# Patient Record
Sex: Female | Born: 1945 | Race: White | Hispanic: No | Smoking: Former smoker
Health system: Southern US, Community
[De-identification: ages and names within clinical notes are randomized; demographics above are authoritative.]

## PROBLEM LIST (undated history)

## (undated) DIAGNOSIS — M1990 Unspecified osteoarthritis, unspecified site: Secondary | ICD-10-CM

## (undated) DIAGNOSIS — I10 Essential (primary) hypertension: Secondary | ICD-10-CM

## (undated) DIAGNOSIS — G4730 Sleep apnea, unspecified: Secondary | ICD-10-CM

## (undated) DIAGNOSIS — Z5189 Encounter for other specified aftercare: Secondary | ICD-10-CM

## (undated) DIAGNOSIS — Z8709 Personal history of other diseases of the respiratory system: Secondary | ICD-10-CM

## (undated) DIAGNOSIS — E162 Hypoglycemia, unspecified: Secondary | ICD-10-CM

## (undated) DIAGNOSIS — O321XX0 Maternal care for breech presentation, not applicable or unspecified: Secondary | ICD-10-CM

## (undated) DIAGNOSIS — J302 Other seasonal allergic rhinitis: Secondary | ICD-10-CM

## (undated) DIAGNOSIS — J189 Pneumonia, unspecified organism: Secondary | ICD-10-CM

## (undated) DIAGNOSIS — Z86718 Personal history of other venous thrombosis and embolism: Secondary | ICD-10-CM

## (undated) DIAGNOSIS — M8580 Other specified disorders of bone density and structure, unspecified site: Secondary | ICD-10-CM

## (undated) DIAGNOSIS — T7840XA Allergy, unspecified, initial encounter: Secondary | ICD-10-CM

## (undated) DIAGNOSIS — K649 Unspecified hemorrhoids: Secondary | ICD-10-CM

## (undated) DIAGNOSIS — E039 Hypothyroidism, unspecified: Secondary | ICD-10-CM

## (undated) DIAGNOSIS — N390 Urinary tract infection, site not specified: Secondary | ICD-10-CM

## (undated) HISTORY — DX: Urinary tract infection, site not specified: N39.0

## (undated) HISTORY — DX: Allergy, unspecified, initial encounter: T78.40XA

## (undated) HISTORY — PX: COLONOSCOPY: SHX174

## (undated) HISTORY — PX: ESOPHAGOGASTRODUODENOSCOPY: SHX1529

## (undated) HISTORY — DX: Essential (primary) hypertension: I10

## (undated) HISTORY — DX: Other seasonal allergic rhinitis: J30.2

## (undated) HISTORY — DX: Unspecified osteoarthritis, unspecified site: M19.90

## (undated) HISTORY — DX: Unspecified hemorrhoids: K64.9

## (undated) HISTORY — PX: CATARACT EXTRACTION: SUR2

## (undated) HISTORY — DX: Encounter for other specified aftercare: Z51.89

## (undated) HISTORY — DX: Personal history of other venous thrombosis and embolism: Z86.718

---
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
## 1965-10-11 DIAGNOSIS — Z86718 Personal history of other venous thrombosis and embolism: Secondary | ICD-10-CM

## 1965-10-11 HISTORY — DX: Personal history of other venous thrombosis and embolism: Z86.718

## 1998-07-28 ENCOUNTER — Other Ambulatory Visit: Admission: RE | Admit: 1998-07-28 | Discharge: 1998-07-28 | Payer: Self-pay | Admitting: Family Medicine

## 1999-08-10 ENCOUNTER — Other Ambulatory Visit: Admission: RE | Admit: 1999-08-10 | Discharge: 1999-08-10 | Payer: Self-pay | Admitting: *Deleted

## 2000-10-07 ENCOUNTER — Other Ambulatory Visit: Admission: RE | Admit: 2000-10-07 | Discharge: 2000-10-07 | Payer: Self-pay | Admitting: *Deleted

## 2000-10-30 ENCOUNTER — Ambulatory Visit: Admission: RE | Admit: 2000-10-30 | Discharge: 2000-10-30 | Payer: Self-pay | Admitting: *Deleted

## 2001-10-11 HISTORY — PX: OTHER SURGICAL HISTORY: SHX169

## 2002-07-27 ENCOUNTER — Encounter: Payer: Self-pay | Admitting: Ophthalmology

## 2002-07-27 ENCOUNTER — Ambulatory Visit (HOSPITAL_COMMUNITY): Admission: RE | Admit: 2002-07-27 | Discharge: 2002-07-28 | Payer: Self-pay | Admitting: Ophthalmology

## 2004-01-30 ENCOUNTER — Other Ambulatory Visit: Admission: RE | Admit: 2004-01-30 | Discharge: 2004-01-30 | Payer: Self-pay | Admitting: Family Medicine

## 2004-08-18 ENCOUNTER — Ambulatory Visit: Payer: Self-pay | Admitting: Internal Medicine

## 2004-08-20 ENCOUNTER — Ambulatory Visit: Payer: Self-pay | Admitting: Internal Medicine

## 2004-11-10 ENCOUNTER — Ambulatory Visit: Payer: Self-pay | Admitting: Family Medicine

## 2005-01-07 ENCOUNTER — Ambulatory Visit: Payer: Self-pay | Admitting: Family Medicine

## 2005-01-26 ENCOUNTER — Ambulatory Visit: Payer: Self-pay | Admitting: Family Medicine

## 2005-02-17 ENCOUNTER — Ambulatory Visit: Payer: Self-pay | Admitting: Family Medicine

## 2005-02-24 ENCOUNTER — Other Ambulatory Visit: Admission: RE | Admit: 2005-02-24 | Discharge: 2005-02-24 | Payer: Self-pay | Admitting: Internal Medicine

## 2005-02-24 ENCOUNTER — Ambulatory Visit: Payer: Self-pay | Admitting: Family Medicine

## 2005-03-18 ENCOUNTER — Ambulatory Visit: Payer: Self-pay | Admitting: Internal Medicine

## 2005-06-30 ENCOUNTER — Ambulatory Visit: Payer: Self-pay | Admitting: Family Medicine

## 2005-11-25 ENCOUNTER — Ambulatory Visit: Payer: Self-pay | Admitting: Family Medicine

## 2006-02-17 ENCOUNTER — Ambulatory Visit: Payer: Self-pay | Admitting: Family Medicine

## 2006-03-03 ENCOUNTER — Ambulatory Visit: Payer: Self-pay | Admitting: Family Medicine

## 2006-10-08 ENCOUNTER — Ambulatory Visit: Payer: Self-pay | Admitting: Internal Medicine

## 2006-12-27 ENCOUNTER — Ambulatory Visit: Payer: Self-pay | Admitting: Family Medicine

## 2007-03-01 ENCOUNTER — Ambulatory Visit: Payer: Self-pay | Admitting: Family Medicine

## 2007-03-01 LAB — CONVERTED CEMR LAB
ALT: 14 units/L (ref 0–40)
AST: 19 units/L (ref 0–37)
Albumin: 3.8 g/dL (ref 3.5–5.2)
Alkaline Phosphatase: 65 units/L (ref 39–117)
BUN: 12 mg/dL (ref 6–23)
Basophils Absolute: 0 10*3/uL (ref 0.0–0.1)
Basophils Relative: 0.1 % (ref 0.0–1.0)
Bilirubin, Direct: 0.1 mg/dL (ref 0.0–0.3)
CO2: 32 meq/L (ref 19–32)
Calcium: 9.5 mg/dL (ref 8.4–10.5)
Chloride: 110 meq/L (ref 96–112)
Cholesterol: 168 mg/dL (ref 0–200)
Creatinine, Ser: 0.6 mg/dL (ref 0.4–1.2)
Eosinophils Absolute: 0.1 10*3/uL (ref 0.0–0.6)
Eosinophils Relative: 2.2 % (ref 0.0–5.0)
GFR calc Af Amer: 131 mL/min
GFR calc non Af Amer: 108 mL/min
Glucose, Bld: 87 mg/dL (ref 70–99)
HCT: 39.2 % (ref 36.0–46.0)
HDL: 55.9 mg/dL (ref >39.0)
Hemoglobin: 13.3 g/dL (ref 12.0–15.0)
LDL Cholesterol: 82 mg/dL (ref 0–99)
Lymphocytes Relative: 30 % (ref 12.0–46.0)
MCHC: 34 g/dL (ref 30.0–36.0)
MCV: 89.3 fL (ref 78.0–100.0)
Monocytes Absolute: 0.4 10*3/uL (ref 0.2–0.7)
Monocytes Relative: 9.8 % (ref 3.0–11.0)
Neutro Abs: 2.6 10*3/uL (ref 1.4–7.7)
Neutrophils Relative %: 57.9 % (ref 43.0–77.0)
Platelets: 256 10*3/uL (ref 150–400)
Potassium: 4.2 meq/L (ref 3.5–5.1)
RBC: 4.39 M/uL (ref 3.87–5.11)
RDW: 13 % (ref 11.5–14.6)
Sodium: 144 meq/L (ref 135–145)
TSH: 0.05 microintl units/mL — ABNORMAL LOW (ref 0.35–5.50)
Total Bilirubin: 0.7 mg/dL (ref 0.3–1.2)
Total CHOL/HDL Ratio: 3
Total Protein: 6.9 g/dL (ref 6.0–8.3)
Triglycerides: 153 mg/dL — ABNORMAL HIGH (ref 0–149)
VLDL: 31 mg/dL (ref 0–40)
WBC: 4.5 10*3/uL (ref 4.5–10.5)

## 2007-03-05 ENCOUNTER — Emergency Department (HOSPITAL_COMMUNITY): Admission: EM | Admit: 2007-03-05 | Discharge: 2007-03-05 | Payer: Self-pay | Admitting: Emergency Medicine

## 2007-03-08 ENCOUNTER — Encounter: Payer: Self-pay | Admitting: Family Medicine

## 2007-03-08 ENCOUNTER — Ambulatory Visit: Payer: Self-pay | Admitting: Family Medicine

## 2007-03-08 ENCOUNTER — Other Ambulatory Visit: Admission: RE | Admit: 2007-03-08 | Discharge: 2007-03-08 | Payer: Self-pay | Admitting: *Deleted

## 2007-03-08 LAB — CONVERTED CEMR LAB: Pap Smear: NORMAL

## 2007-03-13 LAB — CONVERTED CEMR LAB: Pap Smear: NEGATIVE

## 2007-08-26 ENCOUNTER — Ambulatory Visit: Payer: Self-pay | Admitting: Family Medicine

## 2007-08-26 DIAGNOSIS — J309 Allergic rhinitis, unspecified: Secondary | ICD-10-CM | POA: Insufficient documentation

## 2007-08-31 ENCOUNTER — Ambulatory Visit: Payer: Self-pay | Admitting: Family Medicine

## 2007-09-27 ENCOUNTER — Ambulatory Visit: Payer: Self-pay | Admitting: Family Medicine

## 2007-10-24 ENCOUNTER — Ambulatory Visit: Payer: Self-pay | Admitting: Family Medicine

## 2007-11-07 ENCOUNTER — Telehealth: Payer: Self-pay | Admitting: Family Medicine

## 2008-03-14 ENCOUNTER — Ambulatory Visit: Payer: Self-pay | Admitting: Family Medicine

## 2008-03-14 LAB — CONVERTED CEMR LAB
Bilirubin Urine: NEGATIVE
Blood in Urine, dipstick: NEGATIVE
Glucose, Urine, Semiquant: NEGATIVE
Ketones, urine, test strip: NEGATIVE
Nitrite: NEGATIVE
Protein, U semiquant: NEGATIVE
Specific Gravity, Urine: <1.005
Urobilinogen, UA: 0.2
WBC Urine, dipstick: NEGATIVE
pH: 5

## 2008-03-27 ENCOUNTER — Ambulatory Visit: Payer: Self-pay | Admitting: Family Medicine

## 2008-03-27 DIAGNOSIS — E039 Hypothyroidism, unspecified: Secondary | ICD-10-CM | POA: Insufficient documentation

## 2008-03-27 DIAGNOSIS — M129 Arthropathy, unspecified: Secondary | ICD-10-CM | POA: Insufficient documentation

## 2008-03-27 LAB — CONVERTED CEMR LAB
ALT: 14 units/L (ref 0–35)
AST: 21 units/L (ref 0–37)
Albumin: 3.8 g/dL (ref 3.5–5.2)
Alkaline Phosphatase: 62 units/L (ref 39–117)
BUN: 11 mg/dL (ref 6–23)
Basophils Absolute: 0 10*3/uL (ref 0.0–0.1)
Basophils Relative: 0.7 % (ref 0.0–1.0)
Bilirubin, Direct: 0.1 mg/dL (ref 0.0–0.3)
CO2: 30 meq/L (ref 19–32)
Calcium: 9.3 mg/dL (ref 8.4–10.5)
Chloride: 105 meq/L (ref 96–112)
Cholesterol: 144 mg/dL (ref 0–200)
Creatinine, Ser: 0.6 mg/dL (ref 0.4–1.2)
Eosinophils Absolute: 0.2 10*3/uL (ref 0.0–0.7)
Eosinophils Relative: 3.2 % (ref 0.0–5.0)
GFR calc Af Amer: 130 mL/min
GFR calc non Af Amer: 107 mL/min
Glucose, Bld: 93 mg/dL (ref 70–99)
HCT: 38.6 % (ref 36.0–46.0)
HDL: 61.8 mg/dL (ref >39.0)
Hemoglobin: 13.5 g/dL (ref 12.0–15.0)
LDL Cholesterol: 64 mg/dL (ref 0–99)
Lymphocytes Relative: 32.1 % (ref 12.0–46.0)
MCHC: 34.9 g/dL (ref 30.0–36.0)
MCV: 90.4 fL (ref 78.0–100.0)
Monocytes Absolute: 0.5 10*3/uL (ref 0.1–1.0)
Monocytes Relative: 10.2 % (ref 3.0–12.0)
Neutro Abs: 2.6 10*3/uL (ref 1.4–7.7)
Neutrophils Relative %: 53.8 % (ref 43.0–77.0)
Platelets: 258 10*3/uL (ref 150–400)
Potassium: 3.8 meq/L (ref 3.5–5.1)
RBC: 4.27 M/uL (ref 3.87–5.11)
RDW: 12.8 % (ref 11.5–14.6)
Sodium: 140 meq/L (ref 135–145)
TSH: 0.12 microintl units/mL — ABNORMAL LOW (ref 0.35–5.50)
Total Bilirubin: 0.7 mg/dL (ref 0.3–1.2)
Total CHOL/HDL Ratio: 2.3
Total Protein: 6.9 g/dL (ref 6.0–8.3)
Triglycerides: 90 mg/dL (ref 0–149)
VLDL: 18 mg/dL (ref 0–40)
WBC: 4.8 10*3/uL (ref 4.5–10.5)

## 2008-05-02 ENCOUNTER — Ambulatory Visit: Payer: Self-pay | Admitting: Gastroenterology

## 2008-05-13 ENCOUNTER — Ambulatory Visit: Payer: Self-pay | Admitting: Gastroenterology

## 2008-05-13 HISTORY — PX: COLONOSCOPY: SHX174

## 2008-07-18 ENCOUNTER — Ambulatory Visit: Payer: Self-pay | Admitting: Family Medicine

## 2008-08-28 ENCOUNTER — Ambulatory Visit: Payer: Self-pay | Admitting: Family Medicine

## 2008-08-29 ENCOUNTER — Telehealth: Payer: Self-pay | Admitting: Family Medicine

## 2008-09-12 ENCOUNTER — Encounter: Payer: Self-pay | Admitting: Family Medicine

## 2008-09-25 ENCOUNTER — Ambulatory Visit: Payer: Self-pay | Admitting: Family Medicine

## 2008-09-25 DIAGNOSIS — H8309 Labyrinthitis, unspecified ear: Secondary | ICD-10-CM | POA: Insufficient documentation

## 2008-09-27 ENCOUNTER — Telehealth: Payer: Self-pay | Admitting: Family Medicine

## 2008-12-05 ENCOUNTER — Ambulatory Visit: Payer: Self-pay | Admitting: Family Medicine

## 2008-12-05 DIAGNOSIS — I10 Essential (primary) hypertension: Secondary | ICD-10-CM | POA: Insufficient documentation

## 2009-01-21 ENCOUNTER — Ambulatory Visit: Payer: Self-pay | Admitting: Family Medicine

## 2009-02-06 ENCOUNTER — Telehealth: Payer: Self-pay | Admitting: Family Medicine

## 2009-02-13 ENCOUNTER — Telehealth: Payer: Self-pay | Admitting: Family Medicine

## 2009-02-25 ENCOUNTER — Telehealth (INDEPENDENT_AMBULATORY_CARE_PROVIDER_SITE_OTHER): Payer: Self-pay | Admitting: *Deleted

## 2009-04-29 ENCOUNTER — Ambulatory Visit: Payer: Self-pay | Admitting: Family Medicine

## 2009-05-07 ENCOUNTER — Ambulatory Visit: Payer: Self-pay | Admitting: Family Medicine

## 2009-05-07 DIAGNOSIS — F410 Panic disorder [episodic paroxysmal anxiety] without agoraphobia: Secondary | ICD-10-CM | POA: Insufficient documentation

## 2009-05-11 LAB — CONVERTED CEMR LAB
Albumin: 3.7 g/dL (ref 3.5–5.2)
Alkaline Phosphatase: 54 units/L (ref 39–117)
Basophils Absolute: 0.1 10*3/uL (ref 0.0–0.1)
Chloride: 108 meq/L (ref 96–112)
Cholesterol: 184 mg/dL (ref 0–200)
Eosinophils Absolute: 0.1 10*3/uL (ref 0.0–0.7)
HDL: 62 mg/dL (ref >39.00)
Hemoglobin: 13.4 g/dL (ref 12.0–15.0)
LDL Cholesterol: 91 mg/dL (ref 0–99)
Leukocytes, UA: NEGATIVE
Lymphocytes Relative: 30.9 % (ref 12.0–46.0)
MCHC: 34.2 g/dL (ref 30.0–36.0)
Neutro Abs: 2.7 10*3/uL (ref 1.4–7.7)
Neutrophils Relative %: 55.5 % (ref 43.0–77.0)
Nitrite: NEGATIVE
Potassium: 4.4 meq/L (ref 3.5–5.1)
RDW: 12.5 % (ref 11.5–14.6)
Sodium: 142 meq/L (ref 135–145)
Specific Gravity, Urine: <=1.005 (ref 1.000–1.030)
TSH: 0.76 microintl units/mL (ref 0.35–5.50)
Triglycerides: 153 mg/dL — ABNORMAL HIGH (ref 0.0–149.0)
Urine Glucose: NEGATIVE mg/dL
Urobilinogen, UA: 0.2 (ref 0.0–1.0)
VLDL: 30.6 mg/dL (ref 0.0–40.0)

## 2009-07-17 ENCOUNTER — Ambulatory Visit: Payer: Self-pay | Admitting: Family Medicine

## 2009-10-29 ENCOUNTER — Telehealth: Payer: Self-pay | Admitting: Family Medicine

## 2009-12-04 ENCOUNTER — Telehealth: Payer: Self-pay | Admitting: Family Medicine

## 2009-12-30 ENCOUNTER — Ambulatory Visit: Payer: Self-pay | Admitting: Family Medicine

## 2009-12-30 DIAGNOSIS — R062 Wheezing: Secondary | ICD-10-CM | POA: Insufficient documentation

## 2009-12-31 ENCOUNTER — Telehealth: Payer: Self-pay | Admitting: Family Medicine

## 2010-01-01 ENCOUNTER — Telehealth: Payer: Self-pay | Admitting: Family Medicine

## 2010-03-04 ENCOUNTER — Encounter: Payer: Self-pay | Admitting: Family Medicine

## 2010-03-10 ENCOUNTER — Encounter: Payer: Self-pay | Admitting: Family Medicine

## 2010-05-05 ENCOUNTER — Ambulatory Visit: Payer: Self-pay | Admitting: Family Medicine

## 2010-05-05 LAB — CONVERTED CEMR LAB
Bilirubin Urine: NEGATIVE
Blood in Urine, dipstick: NEGATIVE
Glucose, Urine, Semiquant: NEGATIVE
Ketones, urine, test strip: NEGATIVE
Nitrite: NEGATIVE
Protein, U semiquant: NEGATIVE
Specific Gravity, Urine: <1.005
Urobilinogen, UA: 0.2
WBC Urine, dipstick: NEGATIVE
pH: 6

## 2010-05-14 ENCOUNTER — Ambulatory Visit: Payer: Self-pay | Admitting: Family Medicine

## 2010-05-14 ENCOUNTER — Other Ambulatory Visit: Admission: RE | Admit: 2010-05-14 | Discharge: 2010-05-14 | Payer: Self-pay | Admitting: Family Medicine

## 2010-05-14 DIAGNOSIS — R030 Elevated blood-pressure reading, without diagnosis of hypertension: Secondary | ICD-10-CM | POA: Insufficient documentation

## 2010-05-20 ENCOUNTER — Encounter: Payer: Self-pay | Admitting: Family Medicine

## 2010-05-20 LAB — CONVERTED CEMR LAB
AST: 19 units/L (ref 0–37)
Albumin: 3.9 g/dL (ref 3.5–5.2)
Basophils Absolute: 0 10*3/uL (ref 0.0–0.1)
Basophils Relative: 0.7 % (ref 0.0–3.0)
CO2: 30 meq/L (ref 19–32)
Eosinophils Absolute: 0.1 10*3/uL (ref 0.0–0.7)
Glucose, Bld: 93 mg/dL (ref 70–99)
HCT: 35.8 % — ABNORMAL LOW (ref 36.0–46.0)
Hemoglobin: 12.3 g/dL (ref 12.0–15.0)
Lymphs Abs: 1.1 10*3/uL (ref 0.7–4.0)
MCHC: 34.4 g/dL (ref 30.0–36.0)
Monocytes Relative: 9 % (ref 3.0–12.0)
Neutro Abs: 2.2 10*3/uL (ref 1.4–7.7)
Potassium: 4.6 meq/L (ref 3.5–5.1)
RBC: 3.86 M/uL — ABNORMAL LOW (ref 3.87–5.11)
RDW: 12.9 % (ref 11.5–14.6)
Sodium: 142 meq/L (ref 135–145)
TSH: 0.12 microintl units/mL — ABNORMAL LOW (ref 0.35–5.50)
Total Protein: 6.4 g/dL (ref 6.0–8.3)

## 2010-07-09 ENCOUNTER — Telehealth: Payer: Self-pay | Admitting: Family Medicine

## 2010-07-09 ENCOUNTER — Ambulatory Visit: Payer: Self-pay | Admitting: Family Medicine

## 2010-08-12 ENCOUNTER — Ambulatory Visit: Payer: Self-pay | Admitting: Family Medicine

## 2010-08-12 DIAGNOSIS — J45909 Unspecified asthma, uncomplicated: Secondary | ICD-10-CM | POA: Insufficient documentation

## 2010-08-12 DIAGNOSIS — J019 Acute sinusitis, unspecified: Secondary | ICD-10-CM | POA: Insufficient documentation

## 2010-08-26 ENCOUNTER — Ambulatory Visit: Payer: Self-pay | Admitting: Family Medicine

## 2010-11-10 NOTE — Assessment & Plan Note (Signed)
Summary: ?SINUS INF/NJR   Vital Signs:  Patient profile:   65 year old female Menstrual status:  postmenopausal Height:      65.5 inches (166.37 cm) Weight:      185 pounds (84.09 kg) Temp:     98.6 degrees F (37.00 degrees C) oral Pulse rate:   88 / minute BP sitting:   116 / 80  (left arm) Cuff size:   large  Vitals Entered By: Josph Macho RMA (August 12, 2010 10:36 AM) CC: possible sinus infection X3 days- head congested (yesterday), blows nose (yellow), now in chest cough w/ phlegm (sometimes clear, sometimes yellow)/ CF Is Patient Diabetic? No   History of Present Illness: is a 64 year old Caucasian female in today with a 3 day history of worsening congestion. She is a nonsmoker but has a long history of allergic rhinitis recurrent sinusitis and asthma which has worsened over the last year. She reports typically each spring each fall she will get a flare of her allergies leading to. She started 3 days ago with head congestion productive of yellow sputum malaise, myalgias and then over the last 24 hours for congestion feels like is moving into her chest she is now coughing up some yellowish sputum. The cough has become severe enough to catch her breath at times but she denies that her shortness of breath or wheezing thus far. No high-grade fevers chills, chest pain, palpitations, GI or GU complaints at this time.  Current Medications (verified): 1)  Adult Aspirin Ec Low Strength 81 Mg  Tbec (Aspirin) .... Once Daily 2)  Fish Oil .... At Bedtime 3)  Tylenol Arthritis 4)  Mucinex D [phone] Mg Xr12h-Tab (Pseudoephedrine-Guaifenesin) .Marland Kitchen.. 1 Two Times A Day 5)  Vitamin C Cr 500 Mg Cr-Caps (Ascorbic Acid) .... Once Daily 6)  Omnaris 50 Mcg/act Susp (Ciclesonide) .... 2 Spray7s Each Nostril Each Day 7)  Lisinopril 20 Mg Tabs (Lisinopril) .Marland Kitchen.. 1 Once Daily For Blood Pressure 8)  Lotrisone 1-0.05 % Crea (Clotrimazole-Betamethasone) .... Apply Two Times A Day For 10 Days , Repeat As  Needed 9)  Gnp Hydrocortisone 1 % Crea (Hydrocortisone) .... Apply Three Times A Day  For 3 Days Then Apply Two Times A Day Until 2 Days After Rash Is Gone 10)  Claritin 10 Mg Tabs (Loratadine) .... Once Daily 11)  Vitamin B-12 500 Mcg Tabs (Cyanocobalamin) .Marland Kitchen.. 1 Once Daily 12)  Sm Folic Acid 400 Mcg Tabs (Folic Acid) .Marland Kitchen.. 1 By Mouth Two Times A Day 13)  Tylenol Extra Strength 500 Mg Tabs (Acetaminophen) .... As Needed 14)  Alprazolam 0.25 Mg Tabs (Alprazolam) .Marland Kitchen.. 1 By  Morning Midafternoon and Bedtime To Prevent Stress and Panic Attacks 15)  Symbicort 160-4.5 Mcg/act Aero (Budesonide-Formoterol Fumarate) .... 2 Inhalations Bid 16)  Levothroid 112 Mcg Tabs (Levothyroxine Sodium) .Marland Kitchen.. 1 Qd  Allergies (verified): 1)  ! Codeine 2)  ! * Lodeine  Past History:  Past medical history reviewed for relevance to current acute and chronic problems. Social history (including risk factors) reviewed for relevance to current acute and chronic problems.  Social History: Reviewed history from 03/27/2008 and no changes required. Former Smoker  Review of Systems      See HPI  Physical Exam  General:  Well-developed,well-nourished,in no acute distress; alert,appropriate and cooperative throughout examination Head:  Normocephalic and atraumatic without obvious abnormalities. No apparent alopecia or balding. Ears:  External ear exam shows no significant lesions or deformities.  Otoscopic examination reveals clear canals, tympanic membranes are intact bilaterally  without bulging, retraction, inflammation or discharge. Hearing is grossly normal bilaterally. Nose:  mucosal erythema and mucosal edema.   Mouth:  Oral mucosa and oropharynx without lesions or exudates.   Mild erythema in posterior oropharynx Neck:  No deformities, masses, or tenderness noted. Lungs:  Normal respiratory effort, chest expands symmetrically. Lungs are clear to auscultation, no crackles or wheezes. Heart:  Normal rate and  regular rhythm. S1 and S2 normal without gallop, murmur, click, rub or other extra sounds. Abdomen:  Bowel sounds positive,abdomen soft and non-tender without masses, organomegaly or hernias noted. Obese Extremities:  No clubbing, cyanosis, edema, or deformity noted with normal full range of motion of all joints.   Cervical Nodes:  No lymphadenopathy noted Psych:  Cognition and judgment appear intact. Alert and cooperative with normal attention span and concentration. No apparent delusions, illusions, hallucinations   Impression & Recommendations:  Problem # 1:  Acute Sinusitis increase fluids Take Mucinex to help keep secretions loose Take Augmentin 875 mg twice a day until all the medicaqiton is gone. Take a probiotic (Align) while taking the antibiotic and for 2 weeks after.  Consider eating a yogurt every day. Taken out of work 11/2 to 11/3  Problem # 2:  Asthma use a rescue inhaler for c hest tightness or wheezing.  Given an rx for Proventil as needed and given samples of her Symbicort, report worsening symptoms  Problem # 3:  HYPERTENSION, BENIGN (ICD-401.1)  Her updated medication list for this problem includes:    Lisinopril 20 Mg Tabs (Lisinopril) .Marland Kitchen... 1 once daily for blood pressure Well controlled despite current illness no changes to meds  Complete Medication List: 1)  Adult Aspirin Ec Low Strength 81 Mg Tbec (Aspirin) .... Once daily 2)  Fish Oil  .... At bedtime 3)  Tylenol Arthritis  4)  Mucinex D [phone] Mg Xr12h-tab (Pseudoephedrine-guaifenesin) .Marland Kitchen.. 1 two times a day 5)  Vitamin C Cr 500 Mg Cr-caps (Ascorbic acid) .... Once daily 6)  Omnaris 50 Mcg/act Susp (Ciclesonide) .... 2 spray7s each nostril each day 7)  Lisinopril 20 Mg Tabs (Lisinopril) .Marland Kitchen.. 1 once daily for blood pressure 8)  Lotrisone 1-0.05 % Crea (Clotrimazole-betamethasone) .... Apply two times a day for 10 days , repeat as needed 9)  Gnp Hydrocortisone 1 % Crea (Hydrocortisone) .... Apply three  times a day  for 3 days then apply two times a day until 2 days after rash is gone 10)  Claritin 10 Mg Tabs (Loratadine) .... Once daily 11)  Vitamin B-12 500 Mcg Tabs (Cyanocobalamin) .Marland Kitchen.. 1 once daily 12)  Sm Folic Acid 400 Mcg Tabs (Folic acid) .Marland Kitchen.. 1 by mouth two times a day 13)  Tylenol Extra Strength 500 Mg Tabs (Acetaminophen) .... As needed 14)  Alprazolam 0.25 Mg Tabs (Alprazolam) .Marland Kitchen.. 1 by  morning midafternoon and bedtime to prevent stress and panic attacks 15)  Symbicort 160-4.5 Mcg/act Aero (Budesonide-formoterol fumarate) .... 2 inhalations bid 16)  Levothroid 112 Mcg Tabs (Levothyroxine sodium) .Marland Kitchen.. 1 qd 17)  Augmentin 875-125 Mg Tabs (Amoxicillin-pot clavulanate) .Marland Kitchen.. 1 tab by mouth two times a day # 10 days 18)  Mucinex 600 Mg Xr12h-tab (Guaifenesin) .Marland Kitchen.. 1 tab by mouth two times a day x 10 days 19)  Proventil Hfa 108 (90 Base) Mcg/act Aers (Albuterol sulfate) .Marland Kitchen.. 1-2 puffs by mouth q 4-6 hr as needed sob/cough/wheeze  Patient Instructions: 1)  Please schedule a follow-up appointment as needed if symptoms worsenor do not improve 2)  Take your antibiotic as prescribed  until ALL of it is gone, but stop if you develop a rash or swelling and contact our office as soon as possible.  3)  Acute sinusitis symptoms for less than 10 days are not helped by antibiotics. Use warm moist compresses, and over the counter decongestants( only as directed). Call if no improvement in 5-7 days, sooner if increasing pain, fever, or new symptoms.  4)  Consider a probiotic such as Align or a yogurt daily while on antibiotics Prescriptions: PROVENTIL HFA 108 (90 BASE) MCG/ACT AERS (ALBUTEROL SULFATE) 1-2 puffs by mouth q 4-6 hr as needed sob/cough/wheeze  #60 dose hfa x 1   Entered and Authorized by:   Danise Edge MD   Signed by:   Danise Edge MD on 08/12/2010   Method used:   Print then Give to Patient   RxID:   [id number] AUGMENTIN 875-125 MG TABS (AMOXICILLIN-POT CLAVULANATE) 1 tab by  mouth two times a day # 10 days  #20 x 0   Entered and Authorized by:   Danise Edge MD   Signed by:   Danise Edge MD on 08/12/2010   Method used:   Electronically to        [facility] [address] #[id number]* (retail)       [address] [place], [place]  [address]       Ph: [phone]       Fax: [phone]   RxID:   [id number]    Orders Added: 1)  Est. Patient Level IV [47425]

## 2010-11-10 NOTE — Progress Notes (Signed)
Summary: rx alprazolam w 5 refills   Phone Note From Pharmacy   Caller: [facility] [address] #[id number]* Reason for Call: Needs renewal Summary of Call: refill alprazolam Initial call taken by: Pura Spice, RN,  December 04, 2009 3:27 PM  Follow-up for Phone Call        ok x 5 refills per dr Scotty Court  Follow-up by: Pura Spice, RN,  December 04, 2009 3:27 PM    New/Updated Medications: ALPRAZOLAM 0.25 MG TABS (ALPRAZOLAM) 1 MORN , MIDAFTERNOON AND HS TO PREVENT STRESS AND PANIC ATTACKS Prescriptions: ALPRAZOLAM 0.25 MG TABS (ALPRAZOLAM) 1 MORN , MIDAFTERNOON AND HS TO PREVENT STRESS AND PANIC ATTACKS  #90 x 5   Entered by:   Pura Spice, RN   Authorized by:   Judithann Sheen MD   Signed by:   Pura Spice, RN on 12/04/2009   Method used:   Telephoned to ...       [facility] #[id number]* (retail)       [address] [place], [place]  [address]       Ph: [phone]       Fax: [phone]   RxID:   [id number]

## 2010-11-10 NOTE — Assessment & Plan Note (Signed)
Summary: CPX//SLM   Vital Signs:  Patient profile:   65 year old female Menstrual status:  postmenopausal Height:      65.5 inches Weight:      184 pounds BMI:     30.26 Temp:     98.2 degrees F Pulse rate:   56 / minute Pulse rhythm:   regular BP sitting:   136 / 80  (left arm)  Vitals Entered By: Pura Spice, RN (May 14, 2010 9:05 AM) CC: refill go over problmes discuss labs needs PAP  Is Patient Diabetic? No     Menstrual Status postmenopausal Last PAP Result Interpretation/Result:Negative for intraepithelial Lesion or Malignancy.      History of Present Illness: This 65 year old white separated female is in for complete physical examination Complaints of chest congestion tight chest with mild wheezing no problem with call Under considerable stress and has anxiety and needs medication All toes he thinks he feels much better physically over the past year immunizations are up to date Colonoscopic exam 2000 and repeat in 10 years time around 2011  Preventive Screening-Counseling & Management  Alcohol-Tobacco     Smoking Status: quit  Allergies: 1)  ! Codeine 2)  ! Weston Brass  Past History:  Social History: Last updated: 03/27/2008 Former Smoker  Risk Factors: Smoking Status: quit (05/14/2010)  Past Surgical History: Detached retina rt,2003 Cataract rt eye extract 2004  Review of Systems      See HPI  The patient denies anorexia, fever, weight loss, weight gain, vision loss, decreased hearing, hoarseness, chest pain, syncope, dyspnea on exertion, peripheral edema, prolonged cough, headaches, hemoptysis, abdominal pain, melena, hematochezia, severe indigestion/heartburn, hematuria, incontinence, genital sores, muscle weakness, suspicious skin lesions, transient blindness, difficulty walking, depression, unusual weight change, abnormal bleeding, enlarged lymph nodes, angioedema, breast masses, and testicular masses.    Physical Exam  General:   Well-developed,well-nourished,in no acute distress; alert,appropriate and cooperative throughout examinationoverweight-appearing.   Head:  Normocephalic and atraumatic without obvious abnormalities. No apparent alopecia or balding. Eyes:  No corneal or conjunctival inflammation noted. EOMI. Perrla. Funduscopic exam benign, without hemorrhages, exudates or papilledema. Vision grossly normal. Ears:  External ear exam shows no significant lesions or deformities.  Otoscopic examination reveals clear canals, tympanic membranes are intact bilaterally without bulging, retraction, inflammation or discharge. Hearing is grossly normal bilaterally. Nose:  boggy pale nasal mucosa with clear drainage Mouth:  Oral mucosa and oropharynx without lesions or exudates.  Teeth in good repair. Neck:  No deformities, masses, or tenderness noted. Chest Wall:  No deformities, masses, or tenderness noted. Breasts:  No mass, nodules, thickening, tenderness, bulging, retraction, inflamation, nipple discharge or skin changes noted.   Lungs:  rhonchi bilaterally with minimal expiratory wheezing No dullness to percussion Heart:  Normal rate and regular rhythm. S1 and S2 normal without gallop, murmur, click, rub or other extra sounds. electrocardiogram refills sinus bradycardia incomplete right bundle branch block no acute ischemic Abdomen:  Bowel sounds positive,abdomen soft and non-tender without masses, organomegaly or hernias noted. Rectal:  No external abnormalities noted. Normal sphincter tone. No rectal masses or tenderness. Genitalia:  Normal introitus for age, no external lesions, no vaginal discharge, mucosa pink and moist, no vaginal or cervical lesions, no vaginal atrophy, no friaility or hemorrhage, normal uterus size and position, no adnexal masses or tenderness Msk:  No deformity or scoliosis noted of thoracic or lumbar spine.   Pulses:  R and L carotid,radial,femoral,dorsalis pedis and posterior tibial pulses are  full and equal bilaterally  Extremities:  No clubbing, cyanosis, edema, or deformity noted with normal full range of motion of all joints.   Neurologic:  No cranial nerve deficits noted. Station and gait are normal. Plantar reflexes are down-going bilaterally. DTRs are symmetrical throughout. Sensory, motor and coordinative functions appear intact. Skin:  Intact without suspicious lesions or rashes Cervical Nodes:  No lymphadenopathy noted Axillary Nodes:  No palpable lymphadenopathy Inguinal Nodes:  No significant adenopathy Psych:  Cognition and judgment appear intact. Alert and cooperative with normal attention span and concentration. No apparent delusions, illusions, hallucinations   Impression & Recommendations:  Problem # 1:  WHEEZING (ICD-786.07) Assessment New Symbicort 160-4.5 2 inhalations bid  Problem # 2:  ALLERGIC RHINITIS (ICD-477.9) Assessment: Unchanged  The following medications were removed from the medication list:    Claritin 10 Mg Tabs (Loratadine) ..... Once daily Her updated medication list for this problem includes:    Omnaris 50 Mcg/act Susp (Ciclesonide) .Marland Kitchen... 2 spray7s each nostril each day    Allegra 180 Mg Tabs (Fexofenadine hcl) .Marland Kitchen... 1 by mouth once daily  Problem # 3:  HYPERTENSION, BENIGN (ICD-401.1) Assessment: Improved  Her updated medication list for this problem includes:    Lisinopril 20 Mg Tabs (Lisinopril) .Marland Kitchen... 1 once daily for blood pressure  Orders: EKG w/ Interpretation (93000)incomplete right bundle branch block  Problem # 4:  HYPOTHYROIDISM (ICD-244.9) Assessment: Improved  The following medications were removed from the medication list:    Levothroid 125 Mcg Tabs (Levothyroxine sodium) .Marland Kitchen... 1 by mouth once daily Her updated medication list for this problem includes:    Levothroid 112 Mcg Tabs (Levothyroxine sodium) .Marland Kitchen... 1 qd  Problem # 5:  ARTHRITIS (ICD-716.90) Assessment: Improved  Problem # 6:  PHYSICAL EXAMINATION  (ICD-V70.0) Assessment: Unchanged  Complete Medication List: 1)  Adult Aspirin Ec Low Strength 81 Mg Tbec (Aspirin) .... Once daily 2)  Fish Oil  .... At bedtime 3)  Tylenol Arthritis  4)  Mucinex D [phone] Mg Xr12h-tab (Pseudoephedrine-guaifenesin) .Marland Kitchen.. 1 two times a day 5)  Vitamin C Cr 500 Mg Cr-caps (Ascorbic acid) .... Once daily 6)  Omnaris 50 Mcg/act Susp (Ciclesonide) .... 2 spray7s each nostril each day 7)  Lisinopril 20 Mg Tabs (Lisinopril) .Marland Kitchen.. 1 once daily for blood pressure 8)  Lotrisone 1-0.05 % Crea (Clotrimazole-betamethasone) .... Apply two times a day for 10 days , repeat as needed 9)  Gnp Hydrocortisone 1 % Crea (Hydrocortisone) .... Apply three times a day  for 3 days then apply two times a day until 2 days after rash is gone 10)  Allegra 180 Mg Tabs (Fexofenadine hcl) .Marland Kitchen.. 1 by mouth once daily 11)  Vitamin B-12 500 Mcg Tabs (Cyanocobalamin) .Marland Kitchen.. 1 once daily 12)  Sm Folic Acid 400 Mcg Tabs (Folic acid) .Marland Kitchen.. 1 by mouth two times a day 13)  Tylenol Extra Strength 500 Mg Tabs (Acetaminophen) .... As needed 14)  Alprazolam 0.25 Mg Tabs (Alprazolam) .Marland Kitchen.. 1 by  morning midafternoon and bedtime to prevent stress and panic attacks 15)  Symbicort 160-4.5 Mcg/act Aero (Budesonide-formoterol fumarate) .... 2 inhalations bid 16)  Levothroid 112 Mcg Tabs (Levothyroxine sodium) .Marland Kitchen.. 1 qd  Patient Instructions: 1)  You need to lose weight. Consider a lower calorie diet and regular exercise.  2)  continue medications as prescribed Prescriptions: LISINOPRIL 20 MG TABS (LISINOPRIL) 1 once daily for Blood pressure  #90 x 3   Entered and Authorized by:   Judithann Sheen MD   Signed by:   Loni Dolly  Alphonzo Severance MD on 05/14/2010   Method used:   Electronically to        [facility] #[id number]* (retail)       [address] [place], [place]  [address]       Ph: [phone]       Fax: [phone]   RxID:   [id number] ALPRAZOLAM 0.25 MG TABS (ALPRAZOLAM) 1 by   morning midafternoon and bedtime to prevent stress and panic attacks  #270 x 1   Entered and Authorized by:   Judithann Sheen MD   Signed by:   Judithann Sheen MD on 05/14/2010   Method used:   Print then Give to Patient   RxID:   [id number] ALLEGRA 180 MG TABS (FEXOFENADINE HCL) 1 by mouth once daily  #90 x 3   Entered and Authorized by:   Judithann Sheen MD   Signed by:   Judithann Sheen MD on 05/14/2010   Method used:   Electronically to        [facility] #[id number]* (retail)       [address] [place], [place]  [address]       Ph: [phone]       Fax: [phone]   RxID:   [id number] LEVOTHROID 112 MCG TABS (LEVOTHYROXINE SODIUM) 1 qd  #90o x 3   Entered and Authorized by:   Judithann Sheen MD   Signed by:   Judithann Sheen MD on 05/14/2010   Method used:   Electronically to        [facility] #[id number]* (retail)       [address] [place], [place]  [address]       Ph: [phone]       Fax: [phone]   RxID:   [id number]    Eye Exam  Dr Lahoma Rocker 2011

## 2010-11-10 NOTE — Progress Notes (Signed)
Summary: Patient has questions regarding her meds  Phone Note Call from Patient Call back at [phone]   Caller: Patient Summary of Call: Patient has questions concerning medications she received yesterday. Initial call taken by: Everrett Coombe,  December 31, 2009 9:57 AM  Follow-up for Phone Call        qustions  about symibcort concerned that package says dont take if htn,cataract or hypothyoid.  per dr Scotty Court to use short term not long term andto stop when better.  Follow-up by: Pura Spice, RN,  December 31, 2009 1:41 PM

## 2010-11-10 NOTE — Progress Notes (Signed)
Summary: Pt has questions re: when to get pneumonia vaccine  Phone Note Call from Patient Call back at Work Phone [phone]   Summary of Call: Pt would like to know if she needs to get an pneumonia vaccine this year. Pt thinks she got one 3 years ago, but isnt sure.  Initial call taken by: Lucy Antigua,  July 09, 2010 2:13 PM  Follow-up for Phone Call        If she had one 3 years ago she does need one once at or after 65 yo. Can come in to get it now or can get it at next appt Follow-up by: Danise Edge MD,  July 10, 2010 11:21 AM  Additional Follow-up for Phone Call Additional follow up Details #1::        Pt states she would call back and schedule a nurse visit. Additional Follow-up by: Josph Macho RMA,  July 10, 2010 2:56 PM

## 2010-11-10 NOTE — Assessment & Plan Note (Signed)
Summary: wheezing/sinus/?allergy/cjr   Vital Signs:  Patient profile:   65 year old female Weight:      194 pounds O2 Sat:      98 % Temp:     98.3 degrees F Pulse rate:   80 / minute BP sitting:   132 / 74  (left arm)  Vitals Entered By: Pura Spice, RN (December 30, 2009 2:28 PM) CC: cough acongestion stated has had wheezing.  Is Patient Diabetic? No   History of Present Illness: This 65 year old white female has had cough chest congestion as well as nasal congestion and wheezing over the past 4 days. She has had fever.patient has been unable to work Blood per her and then control  Allergies: 1)  ! Codeine 2)  ! * Lodeine  Review of Systems  The patient denies anorexia, fever, weight loss, weight gain, vision loss, decreased hearing, hoarseness, chest pain, syncope, dyspnea on exertion, peripheral edema, prolonged cough, headaches, hemoptysis, abdominal pain, melena, hematochezia, severe indigestion/heartburn, hematuria, incontinence, genital sores, muscle weakness, suspicious skin lesions, transient blindness, difficulty walking, depression, unusual weight change, abnormal bleeding, enlarged lymph nodes, angioedema, breast masses, and testicular masses.    Physical Exam  General:  Well-developed,well-nourished,in no acute distress; alert,appropriate and cooperative throughout examination Head:  Normocephalic and atraumatic without obvious abnormalities. No apparent alopecia or balding. Eyes:  No corneal or conjunctival inflammation noted. EOMI. Perrla. Funduscopic exam benign, without hemorrhages, exudates or papilledema. Vision grossly normal. Ears:  External ear exam shows no significant lesions or deformities.  Otoscopic examination reveals clear canals, tympanic membranes are intact bilaterally without bulging, retraction, inflammation or discharge. Hearing is grossly normal bilaterally. Nose:  nasal mucosa boggy pale with clear drainage Mouth:  pharyngeal erythema.     Lungs:  rhonchi with normal expiratory wheezesno dullness and no crackles.   Heart:  Normal rate and regular rhythm. S1 and S2 normal without gallop, murmur, click, rub or other extra sounds.   Impression & Recommendations:  Problem # 1:  ACUTE BRONCHITIS (ICD-466.0) Assessment New  Her updated medication list for this problem includes:    Mucinex D [phone] Mg Xr12h-tab (Pseudoephedrine-guaifenesin) .Marland Kitchen... 1 two times a day    Symbicort 160-4.5 Mcg/act Aero (Budesonide-formoterol fumarate) .Marland Kitchen... 2 inhalations bid  Orders: Prescription Created Electronically [phone])  Problem # 2:  WHEEZING (ICD-786.07) Assessment: New  Orders: Depo- Medrol 40mg  (J1030) Depo- Medrol 80mg  (J1040) Admin of Therapeutic Inj  intramuscular or subcutaneous (67672) Prescription Created Electronically [phone])  Problem # 3:  HYPERTENSION, BENIGN (ICD-401.1) Assessment: Improved  Her updated medication list for this problem includes:    Lisinopril 20 Mg Tabs (Lisinopril) .Marland Kitchen... 1 once daily for blood pressure  Orders: Prescription Created Electronically [phone])  Problem # 4:  ALLERGIC RHINITIS (ICD-477.9) Assessment: New  Her updated medication list for this problem includes:    Claritin 10 Mg Tabs (Loratadine) ..... Once daily    Omnaris 50 Mcg/act Susp (Ciclesonide) .Marland Kitchen... 2 spray7s each nostril each day    Allegra 180 Mg Tabs (Fexofenadine hcl) .Marland Kitchen... 1 by mouth once daily  Orders: Prescription Created Electronically [phone])  Complete Medication List: 1)  Claritin 10 Mg Tabs (Loratadine) .... Once daily 2)  Adult Aspirin Ec Low Strength 81 Mg Tbec (Aspirin) .... Once daily 3)  Fish Oil  .... At bedtime 4)  Tylenol Arthritis  5)  Mucinex D [phone] Mg Xr12h-tab (Pseudoephedrine-guaifenesin) .Marland Kitchen.. 1 two times a day 6)  Vitamin C Cr 500 Mg Cr-caps (Ascorbic acid) .... Once daily 7)  Omnaris 50 Mcg/act Susp (Ciclesonide) .... 2 spray7s each nostril each day 8)  Lisinopril 20 Mg Tabs (Lisinopril)  .Marland Kitchen.. 1 once daily for blood pressure 9)  Lotrisone 1-0.05 % Crea (Clotrimazole-betamethasone) .... Apply two times a day for 10 days , repeat as needed 10)  Gnp Hydrocortisone 1 % Crea (Hydrocortisone) .... Apply three times a day  for 3 days then apply two times a day until 2 days after rash is gone 11)  Levothroid 125 Mcg Tabs (Levothyroxine sodium) .Marland Kitchen.. 1 by mouth once daily 12)  Allegra 180 Mg Tabs (Fexofenadine hcl) .Marland Kitchen.. 1 by mouth once daily 13)  Vitamin B-12 500 Mcg Tabs (Cyanocobalamin) .Marland Kitchen.. 1 once daily 14)  Sm Folic Acid 400 Mcg Tabs (Folic acid) .Marland Kitchen.. 1 by mouth two times a day 15)  Tylenol Extra Strength 500 Mg Tabs (Acetaminophen) .... As needed 16)  Otc Wal-trin  17)  Alprazolam 0.25 Mg Tabs (Alprazolam) .Marland Kitchen.. 1 by  morning midafternoon and bedtime to prevent stress and panic attacks 18)  Symbicort 160-4.5 Mcg/act Aero (Budesonide-formoterol fumarate) .... 2 inhalations bid 19)  Tramadol Hcl 50 Mg Tabs (Tramadol hcl) .Marland Kitchen.. 1 by mouth four times a day for cough  Patient Instructions: 1)  my impression that he started with allergic rhinitis followed with bronchitis and also bronchial asthma 2)  Take medications as prescribed 3)  UC Symbicort and it will not be necessary to refill the high average to have a recurrence of wheezing or asthma please call Prescriptions: AZITHROMYCIN 250 MG  TABS (AZITHROMYCIN) 2 by  mouth today and then 1 daily for 4 days  #6 x 0   Entered by:   Pura Spice, RN   Authorized by:   Judithann Sheen MD   Signed by:   Pura Spice, RN on 01/01/2010   Method used:   Electronically to        [id number]* (retail)       [address] [place], [place]  [address]       Ph: [phone]       Fax: [phone]   RxID:   [id number] TRAMADOL HCL 50 MG TABS (TRAMADOL HCL) 1 by mouth four times a day for cough  #36 x 0   Entered by:   Pura Spice, RN   Authorized by:   Judithann Sheen MD   Signed by:   Pura Spice, RN  on 01/01/2010   Method used:   Electronically to        [id number]* (retail)       [address] [place], [place]  [address]       Ph: [phone]       Fax: [phone]   RxID:   [id number] LISINOPRIL 20 MG TABS (LISINOPRIL) 1 once daily for Blood pressure  #30 x 11   Entered and Authorized by:   Judithann Sheen MD   Signed by:   Judithann Sheen MD on 12/30/2009   Method used:   Electronically to        [id number]* (retail)       [address] [place], [place]  [address]       Ph: [phone]       Fax: [phone]   RxID:   [id number] LEVOTHROID 125 MCG TABS (  LEVOTHYROXINE SODIUM) 1 by mouth once daily  #30 x 11   Entered and Authorized by:   Judithann Sheen MD   Signed by:   Judithann Sheen MD on 12/30/2009   Method used:   Electronically to        [id number]* (retail)       [address] [place], [place]  [address]       Ph: [phone]       Fax: [phone]   RxID:   [id number]  01-01-2009 zpak and tramadol called in --pt called with cont cough and this ov note still open and could not document this on phone note because this note open.......gh rn.........Marland Kitchen   Medication Administration  Injection # 1:    Medication: Depo- Medrol 40mg     Diagnosis: WHEEZING (ICD-786.07)    Route: IM    Site: RUOQ gluteus    Exp Date: 08/2012    Lot #: [id number]    Mfr: Pharmacia    Patient tolerated injection without complications    Given by: Pura Spice, RN (December 30, 2009 3:42 PM)  Injection # 2:    Medication: Depo- Medrol 80mg     Diagnosis: WHEEZING (ICD-786.07)    Route: IM    Site: RUOQ gluteus    Exp Date: 08/2012    Lot #: [id number]    Mfr: Pharmacia    Patient tolerated injection without complications    Given by: Pura Spice, RN (December 30, 2009 3:42 PM)  Orders Added: 1)  Depo- Medrol 40mg  [J1030] 2)  Depo- Medrol 80mg  [J1040] 3)  Admin of Therapeutic Inj   intramuscular or subcutaneous [96372] 4)  Prescription Created Electronically [phone]

## 2010-11-10 NOTE — Progress Notes (Signed)
Summary: REFILL  ALLEGRA   Phone Note Refill Request Message from:  Fax from Pharmacy  Refills Requested: Medication #1:  ALLEGRA 180 MG TABS 1 by mouth once daily   Brand Name Necessary? No [facility] PH---[phone]   FAX---[phone]  Initial call taken by: Warnell Forester,  October 29, 2009 12:50 PM  Follow-up for Phone Call        OK PER DR STAFFFORD Follow-up by: Pura Spice, RN,  October 29, 2009 12:57 PM    New/Updated Medications: ALLEGRA 180 MG TABS (FEXOFENADINE HCL) 1 by mouth once daily Prescriptions: ALLEGRA 180 MG TABS (FEXOFENADINE HCL) 1 by mouth once daily  #30 x 11   Entered by:   Pura Spice, RN   Authorized by:   Judithann Sheen MD   Signed by:   Pura Spice, RN on 10/29/2009   Method used:   Electronically to        [id number]* (retail)       [address] [place], [place]  [address]       Ph: [phone]       Fax: [phone]   RxID:   [id number]

## 2010-11-10 NOTE — Letter (Signed)
Summary: Out of Work  Adult nurse at [employer]  [address]   [place], [place] [address]   Phone: [phone]  Fax: [phone]    August 12, 2010   Employee:  [name] [facility]    To Whom It May Concern:   For Medical reasons, please excuse the above named employee from work for the following dates:  Start:   08/11/2010  End:   08/13/2010  If you need additional information, please feel free to contact our office.         Sincerely,    Danise Edge MD

## 2010-11-10 NOTE — Assessment & Plan Note (Signed)
Summary: pnuemo inj//ccm  Nurse Visit   Vitals Entered By: Josph Macho RMA (August 26, 2010 3:36 PM)  Allergies: 1)  ! Codeine 2)  ! * Lodeine  Immunizations Administered:  Pediatric Pneumococcal Vaccine:    Vaccine Type: Prevnar    Site: left deltoid    Mfr: Merck    Dose: 0.5 ml    Route: IM    Given by: Josph Macho RMA    Exp. Date: 02/02/2012    Lot #: [id number]    VIS given: 01/24/09 version given August 26, 2010.  Orders Added: 1)  Pneumococcal Vaccine Ped < 51yrs [90669] 2)  Admin 1st Vaccine [phone]

## 2010-11-10 NOTE — Assessment & Plan Note (Signed)
Summary: FLU INJECTION//SLM  Nurse Visit   Allergies: 1)  ! Codeine 2)  ! * Lodeine  Immunizations Administered:  Influenza Vaccine # 1:    Vaccine Type: Fluvax 3+    Site: left deltoid    Mfr: GlaxoSmithKline    Dose: 0.5 ml    Route: IM    Given by: Kathrynn Speed CMA    Exp. Date: 04/10/2011    Lot #: [id number]    VIS given: 05/05/10 version given July 09, 2010.  Flu Vaccine Consent Questions:    Do you have a history of severe allergic reactions to this vaccine? no    Any prior history of allergic reactions to egg and/or gelatin? no    Do you have a sensitivity to the preservative Thimersol? no    Do you have a past history of Guillan-Barre Syndrome? no    Do you currently have an acute febrile illness? no    Have you ever had a severe reaction to latex? no    Vaccine information given and explained to patient? yes    Are you currently pregnant? no  Orders Added: 1)  Flu Vaccine 72yrs + [90658] 2)  Admin 1st Vaccine [57846]

## 2010-11-10 NOTE — Progress Notes (Signed)
Summary: still coughing   Phone Note Call from Patient   Caller: Patient Summary of Call: pt calls to report cough non productive.  Initial call taken by: Pura Spice, RN,  January 01, 2010 8:38 AM  Follow-up for Phone Call        per dr staffrod to call in zpk and cough med pt stated needs generic and to call to [facility].  also, per dr Scotty Court to continue the mucinex dm max strength two times a day  pt verbalixed understanding and to call back if no better.  Follow-up by: Pura Spice, RN,  January 01, 2010 8:39 AM

## 2010-11-10 NOTE — Letter (Signed)
Summary: Results Follow-up Letter  [place] at [facility]  [address] [place], [place] [address]   Phone: [phone]  Fax: [phone]    05/20/2010  [address] [place], [place]  [address]  Dear Ms. Kerby Moors,   The following are the results of your recent test(s):  Test     Result     Pap Smear  Normal__yes  Sincerely,    DR Gwenyth Bender STA[place]  [place] at [facility]

## 2010-11-14 ENCOUNTER — Encounter: Payer: Self-pay | Admitting: Family Medicine

## 2010-11-14 ENCOUNTER — Ambulatory Visit (INDEPENDENT_AMBULATORY_CARE_PROVIDER_SITE_OTHER): Payer: BC Managed Care – PPO | Admitting: Family Medicine

## 2010-11-14 DIAGNOSIS — J209 Acute bronchitis, unspecified: Secondary | ICD-10-CM

## 2010-11-17 ENCOUNTER — Encounter: Payer: Self-pay | Admitting: Internal Medicine

## 2010-11-17 ENCOUNTER — Ambulatory Visit (INDEPENDENT_AMBULATORY_CARE_PROVIDER_SITE_OTHER): Payer: BC Managed Care – PPO | Admitting: Internal Medicine

## 2010-11-17 DIAGNOSIS — J45909 Unspecified asthma, uncomplicated: Secondary | ICD-10-CM

## 2010-11-17 DIAGNOSIS — J40 Bronchitis, not specified as acute or chronic: Secondary | ICD-10-CM

## 2010-11-17 MED ORDER — DOXYCYCLINE HYCLATE 100 MG PO TABS: 100.0000 mg | ORAL_TABLET | Freq: Two times a day (BID) | ORAL | Status: AC

## 2010-11-17 MED ORDER — PREDNISONE 10 MG PO TABS: ORAL_TABLET | ORAL | Status: DC

## 2010-11-17 NOTE — Progress Notes (Signed)
  Subjective:    Patient ID: [name], female    DOB: [birth year], 65 y.o.   MRN: [id number]  HPI patient presents to clinic as a work in for evaluation of persistent upper respiratory infection. Was seen February 4 with a three-day history of nasal congestion and cough. Was given a Z-Pak as well as Lawyer when necessary. Since that time has noted increasing chest congestion and cough now nonproductive with possible subjective wheezing. Does have history of asthma.  No exacerbating or alleviating factors.    Reviewed past history, medications and allergies.  Review of Systems  Constitutional: Negative for fever, chills, activity change and fatigue.  Respiratory: Positive for cough and wheezing.   Skin: Negative for rash.       Objective:   Physical Exam  Constitutional: She appears well-developed and well-nourished. No distress.  HENT:  Head: Normocephalic and atraumatic.  Right Ear: External ear normal.  Left Ear: External ear normal.  Mouth/Throat: Oropharynx is clear and moist. No oropharyngeal exudate.  Eyes: Conjunctivae are normal. Right eye exhibits no discharge. Left eye exhibits no discharge. No scleral icterus.  Neck: Neck supple. No tracheal deviation present.  Cardiovascular: Normal rate and regular rhythm.   Pulmonary/Chest: Effort normal and breath sounds normal. No respiratory distress. She has no wheezes. She has no rales.  Lymphadenopathy:    She has no cervical adenopathy.  Neurological: She is alert.  Skin: Skin is warm and dry. No rash noted. She is not diaphoretic.  Psychiatric: She has a normal mood and affect.          Assessment & Plan:

## 2010-11-17 NOTE — Assessment & Plan Note (Signed)
Mild exacerbation. No respiratory distress. Begin prednisone taper. Followup closely  If no improvement or worsening.

## 2010-11-17 NOTE — Assessment & Plan Note (Signed)
Begin doxycycline  One hundred milligrams twice a day. Work note provided x 2 days

## 2010-11-26 NOTE — Assessment & Plan Note (Signed)
Summary: sinus inf/[place]/cd   Vital Signs:  Patient profile:   65 year old female Menstrual status:  postmenopausal Weight:      189.25 pounds BMI:     31.13 Temp:     98.4 degrees F oral Pulse rate:   68 / minute Pulse rhythm:   regular BP sitting:   116 / 80  (left arm) Cuff size:   large  Vitals Entered By: Selena Batten Dance CMA Duncan Dull) (November 14, 2010 9:55 AM) CC: ? Sinus infection x3-4 days   History of Present Illness: has been sick for around a week-- with bad headache between eyes - wed afternoon thurs much worse with sneeze and cough  lots of drainage and tickle that gives her a dry cough   started taking mucinex plain on thursday night  nasal drainage - yellow  now rattling cough -- but  cannot get much up  lots of water - drinking a lot and lemon tea  no other otc meds   ears are feeling full / draining  throat is irritated and raw  thinks her glands feel full -- better than yesterday  has not needed inhaler- symbacort and proventil   Allergies: 1)  ! Codeine 2)  ! Weston Brass  Past History:  Past Surgical History: Last updated: 05/14/2010 Detached retina rt,2003 Cataract rt eye extract 2004  Social History: Last updated: 03/27/2008 Former Smoker  Risk Factors: Smoking Status: quit (05/14/2010)  Review of Systems General:  Complains of fatigue, fever, and malaise. Eyes:  Denies blurring, discharge, and eye irritation. ENT:  Complains of nasal congestion, postnasal drainage, sinus pressure, and sore throat; denies earache. CV:  Denies palpitations. Resp:  Complains of cough, sputum productive, and wheezing; denies shortness of breath. GI:  Denies diarrhea, nausea, and vomiting. Derm:  Denies itching, lesion(s), poor wound healing, and rash. Neuro:  Denies headaches, numbness, and tingling.  Physical Exam  General:  overweight but generally well appearing  Head:  normocephalic, atraumatic, and no abnormalities observed.  mild L max sinus  tenderness Eyes:  vision grossly intact, pupils equal, pupils round, and pupils reactive to light.  no conjunctival pallor, injection or icterus  Ears:  R ear normal and L ear normal.   Nose:  nares are injected and congested bilaterally  Mouth:  pharynx pink and moist, no erythema, and no exudates.   Neck:  No deformities, masses, or tenderness noted. Lungs:  CTA with some mild wheezes on forced exp  good air exch overall  no rales / but few isolated rhonchi Heart:  Normal rate and regular rhythm. S1 and S2 normal without gallop, murmur, click, rub or other extra sounds. Skin:  Intact without suspicious lesions or rashes Cervical Nodes:  No lymphadenopathy noted Psych:  normal affect, talkative and pleasant    Impression & Recommendations:  Problem # 1:  BRONCHITIS- ACUTE (ICD-466.0) Assessment New  with hx of asthma and reactive airways (mild today)  adv to start symbacort for next 2 wk rescue inhaler as needed  augmentin as directed  tessalon for cough as needed . recommend sympt care- see pt instructions   pt advised to update me if symptoms worsen or do not improve - esp if weeze The following medications were removed from the medication list:    Augmentin 875-125 Mg Tabs (Amoxicillin-pot clavulanate) .Marland Kitchen... 1 tab by mouth two times a day # 10 days    Mucinex 600 Mg Xr12h-tab (Guaifenesin) .Marland Kitchen... 1 tab by mouth two times a day x  10 days Her updated medication list for this problem includes:    Mucinex D [phone] Mg Xr12h-tab (Pseudoephedrine-guaifenesin) .Marland Kitchen... 1 two times a day    Symbicort 160-4.5 Mcg/act Aero (Budesonide-formoterol fumarate) .Marland Kitchen... 2 inhalations bid    Proventil Hfa 108 (90 Base) Mcg/act Aers (Albuterol sulfate) .Marland Kitchen... 1-2 puffs by mouth q 4-6 hr as needed sob/cough/wheeze    Zithromax Z-pak 250 Mg Tabs (Azithromycin) .Marland Kitchen... Take by mouth as directed    Tessalon 200 Mg Caps (Benzonatate) .Marland Kitchen... 1 by mouth up to three times a day as needed cough- swallow  whole  Orders: Prescription Created Electronically [phone])  Complete Medication List: 1)  Adult Aspirin Ec Low Strength 81 Mg Tbec (Aspirin) .... Once daily 2)  Fish Oil  .... At bedtime 3)  Tylenol Arthritis  4)  Mucinex D [phone] Mg Xr12h-tab (Pseudoephedrine-guaifenesin) .Marland Kitchen.. 1 two times a day 5)  Vitamin C Cr 500 Mg Cr-caps (Ascorbic acid) .... Once daily 6)  Lisinopril 20 Mg Tabs (Lisinopril) .Marland Kitchen.. 1 once daily for blood pressure 7)  Lotrisone 1-0.05 % Crea (Clotrimazole-betamethasone) .... Apply two times a day for 10 days , repeat as needed 8)  Gnp Hydrocortisone 1 % Crea (Hydrocortisone) .... Apply three times a day  for 3 days then apply two times a day until 2 days after rash is gone 9)  Claritin 10 Mg Tabs (Loratadine) .... Once daily 10)  Vitamin B-12 500 Mcg Tabs (Cyanocobalamin) .Marland Kitchen.. 1 once daily 11)  Sm Folic Acid 400 Mcg Tabs (Folic acid) .Marland Kitchen.. 1 by mouth two times a day 12)  Tylenol Extra Strength 500 Mg Tabs (Acetaminophen) .... As needed 13)  Alprazolam 0.25 Mg Tabs (Alprazolam) .Marland Kitchen.. 1 by  morning midafternoon and bedtime to prevent stress and panic attacks 14)  Symbicort 160-4.5 Mcg/act Aero (Budesonide-formoterol fumarate) .... 2 inhalations bid 15)  Levothroid 112 Mcg Tabs (Levothyroxine sodium) .Marland Kitchen.. 1 qd 16)  Proventil Hfa 108 (90 Base) Mcg/act Aers (Albuterol sulfate) .Marland Kitchen.. 1-2 puffs by mouth q 4-6 hr as needed sob/cough/wheeze 17)  Zithromax Z-pak 250 Mg Tabs (Azithromycin) .... Take by mouth as directed 18)  Tessalon 200 Mg Caps (Benzonatate) .Marland Kitchen.. 1 by mouth up to three times a day as needed cough- swallow whole  Patient Instructions: 1)  you can try mucinex over the counter twice daily as directed and nasal saline spray for congestion 2)  tylenol over the counter as directed may help with aches, headache and fever 3)  call if symptoms worsen or if not improved in 4-5 days  4)  take the zithromax as directed  5)  use tessalon for cough  6)  get back on your  maintenence inhaler - symbicort for at least 2 weeks 7)  use rescue inhaler as needed  Prescriptions: TESSALON 200 MG CAPS (BENZONATATE) 1 by mouth up to three times a day as needed cough- swallow whole  #30 x 0   Entered and Authorized by:   Judith Part MD   Signed by:   Judith Part MD on 11/14/2010   Method used:   Electronically to        [id number]* (retail)       [address] [place], [place]  [address]       Ph: [phone]       Fax: [phone]   RxID:   [id number] ZITHROMAX Z-PAK 250 MG TABS (AZITHROMYCIN) take by mouth as directed  #1 pack x 0  Entered and Authorized by:   Judith Part MD   Signed by:   Judith Part MD on 11/14/2010   Method used:   Electronically to        [id number]* (retail)       [address] [place], [place]  [address]       Ph: [phone]       Fax: [phone]   RxID:   [id number]    Orders Added: 1)  Prescription Created Electronically [G8553] 2)  Est. Patient Level III [44010]    Current Allergies (reviewed today): ! CODEINE ! Weston Brass

## 2011-01-23 ENCOUNTER — Other Ambulatory Visit: Payer: Self-pay | Admitting: Family Medicine

## 2011-02-26 NOTE — Op Note (Signed)
[name], [name]                       ACCOUNT NO.:  [id number]   MEDICAL RECORD NO.:  [id number]                   PATIENT TYPE:  OIB   LOCATION:  2550                                 FACILITY:  [facility]   PHYSICIAN:  Beulah Gandy. Ashley Royalty, M.D.              DATE OF BIRTH:  09/22/1946   DATE OF PROCEDURE:  07/27/2002  DATE OF DISCHARGE:                                 OPERATIVE REPORT   PREOPERATIVE DIAGNOSES:  1. Retinal break, left eye.  2. Retinal detachment, right eye.   PROCEDURES:  1. Laser for retinal break, left eye.  2. Scleral buckle with retinal photocoagulation, right eye.   SURGEON:  Beulah Gandy. Ashley Royalty, M.D.   ASSISTANT:  Merian Capron, M.A.   ANESTHESIA:  General.   COMPLICATIONS:  None.   DURATION:  2-1/2 hours.   DESCRIPTION OF PROCEDURE:  After proper endotracheal anesthesia was  obtained, the periphery was examined in the left eye.  A large retinal break  with an operculum and an elevated edge was seen at 2 o'clock.  This was  surrounded with laser marks.  The power was 400 milliwatts, there were 427  burns placed with 1000 microns each and 0.1 seconds each.  The eye was  treated with ointment and closed.  Attention was carried to the right eye,  where the usual prep and drape was performed.  A 360 degree limbal peritomy,  isolation of four rectus muscles on 2-0 silk, localization of a break in the  upper temporal quadrant at 2 o'clock and 3 o'clock.  The round hole was  similar to the attached hole in the left eye.  The bed was made 11 mm in  width, and diathermy was placed in the bed.  A 279 implant was placed around  the eye with the joint at the 11 o'clock position.  A 240 band was placed  around the eye with a 270 sleeve at the 11 o'clock position.  The 508G  radial segment was placed beneath the band at the 2:30 clock hour to support  the retinal breaks.  Scleral flaps were closed with 4-0 Dacron suture.  A  perforation site was chosen at 4 o'clock  in the posterior aspect of the bed,  and a large amount of clear, colorless fluid came forth.  Perfluoropropane  in a 35% concentration 1.2 cc was injected to reinflate the globe at 3  o'clock.  The scleral flaps were closed and the ends were knotted.  The free  ends were removed.  The buckle was adjusted and trimmed, the band was  adjusted and trimmed.  Indirect ophthalmoscopy showed the retina to be lying  nicely on the scleral buckle with the breaks well-supported.  Eight hundred  eighty-seven burns were placed with the indirect ophthalmoscopy laser with a  power between 400 and 900 milliwatts and 0.2 seconds each, at 1000 microns  each.  The suture ends were trimmed.  The conjunctiva was reposited with 7-0  chromic suture.  Polymyxin and gentamicin were irrigated into Tenon's space.  Atropine solution was applied.  Decadron 10 mg was injected into the lower  subconjunctival  space.  Marcaine was injected around the globe for postop pain.  The closing  tension was 21 with a Barraquer tonometer.  Diamox 500 mg was given  intravenous.  Polysporin, a patch and shield were placed.  The patient was  awakened and taken to recovery in satisfactory condition.                                               Beulah Gandy. Ashley Royalty, M.D.    JDM/MEDQ  D:  07/27/2002  T:  07/29/2002  Job:  [id number]

## 2011-05-10 ENCOUNTER — Ambulatory Visit (INDEPENDENT_AMBULATORY_CARE_PROVIDER_SITE_OTHER): Payer: BC Managed Care – PPO | Admitting: Family Medicine

## 2011-05-10 ENCOUNTER — Encounter: Payer: Self-pay | Admitting: Family Medicine

## 2011-05-10 VITALS — BP 112/72 | HR 70 | Temp 98.1°F | Wt 201.0 lb

## 2011-05-10 DIAGNOSIS — M545 Low back pain, unspecified: Secondary | ICD-10-CM

## 2011-05-10 MED ORDER — DICLOFENAC SODIUM 75 MG PO TBEC: 75.0000 mg | DELAYED_RELEASE_TABLET | Freq: Two times a day (BID) | ORAL | Status: DC

## 2011-05-10 MED ORDER — CYCLOBENZAPRINE HCL 10 MG PO TABS: 10.0000 mg | ORAL_TABLET | Freq: Three times a day (TID) | ORAL | Status: DC | PRN

## 2011-05-10 NOTE — Progress Notes (Signed)
  Subjective:    Patient ID: [name], female    DOB: [birth year], 65 y.o.   MRN: [id number]  HPI Here for low back pain which started yesterday when she hard a hard sneeze. She felt a sharp pain in the low back which radiates down the left leg. Using Tylenol and ice.    Review of Systems  Constitutional: Negative.   Musculoskeletal: Positive for back pain.       Objective:   Physical Exam  Constitutional:       In pain  Musculoskeletal:       Tender in the lower back with full ROM. Negative SLR.           Assessment & Plan:   Rest. Off work today and tomorrow.

## 2011-05-12 ENCOUNTER — Other Ambulatory Visit (INDEPENDENT_AMBULATORY_CARE_PROVIDER_SITE_OTHER): Payer: BC Managed Care – PPO

## 2011-05-12 DIAGNOSIS — Z0000 Encounter for general adult medical examination without abnormal findings: Secondary | ICD-10-CM

## 2011-05-12 LAB — CBC WITH DIFFERENTIAL/PLATELET
Basophils Absolute: 0 10*3/uL (ref 0.0–0.1)
Eosinophils Absolute: 0.1 10*3/uL (ref 0.0–0.7)
Hemoglobin: 12.1 g/dL (ref 12.0–15.0)
Lymphocytes Relative: 25.2 % (ref 12.0–46.0)
MCHC: 32.9 g/dL (ref 30.0–36.0)
Monocytes Absolute: 0.5 10*3/uL (ref 0.1–1.0)
Neutro Abs: 2.8 10*3/uL (ref 1.4–7.7)
Neutrophils Relative %: 60.9 % (ref 43.0–77.0)
RDW: 14.4 % (ref 11.5–14.6)

## 2011-05-12 LAB — POCT URINALYSIS DIPSTICK
Bilirubin, UA: NEGATIVE
Blood, UA: NEGATIVE
Ketones, UA: NEGATIVE
Spec Grav, UA: 1.005
pH, UA: 5

## 2011-05-12 LAB — LIPID PANEL: HDL: 70.2 mg/dL (ref >39.00)

## 2011-05-12 LAB — BASIC METABOLIC PANEL
CO2: 28 mEq/L (ref 19–32)
Calcium: 8.8 mg/dL (ref 8.4–10.5)
Creatinine, Ser: 0.9 mg/dL (ref 0.4–1.2)
Glucose, Bld: 79 mg/dL (ref 70–99)

## 2011-05-12 LAB — TSH: TSH: 0.21 u[IU]/mL — ABNORMAL LOW (ref 0.35–5.50)

## 2011-05-13 LAB — HEPATIC FUNCTION PANEL
ALT: 11 U/L (ref 0–35)
AST: 19 U/L (ref 0–37)
Alkaline Phosphatase: 51 U/L (ref 39–117)
Bilirubin, Direct: 0 mg/dL (ref 0.0–0.3)
Total Bilirubin: 0.4 mg/dL (ref 0.3–1.2)

## 2011-05-17 ENCOUNTER — Telehealth: Payer: Self-pay

## 2011-05-17 NOTE — Telephone Encounter (Signed)
Called to give pt lab results. Left a message for pt to return call.  

## 2011-05-18 ENCOUNTER — Telehealth: Payer: Self-pay

## 2011-05-18 NOTE — Telephone Encounter (Signed)
Called pt to give lab results.  Left a message to return call.

## 2011-05-19 ENCOUNTER — Encounter: Payer: Self-pay | Admitting: Family Medicine

## 2011-05-19 ENCOUNTER — Ambulatory Visit (INDEPENDENT_AMBULATORY_CARE_PROVIDER_SITE_OTHER): Payer: BC Managed Care – PPO | Admitting: Family Medicine

## 2011-05-19 VITALS — BP 130/82 | HR 71 | Temp 98.5°F | Ht 65.5 in | Wt 204.0 lb

## 2011-05-19 DIAGNOSIS — R0609 Other forms of dyspnea: Secondary | ICD-10-CM

## 2011-05-19 DIAGNOSIS — Z0000 Encounter for general adult medical examination without abnormal findings: Secondary | ICD-10-CM

## 2011-05-19 DIAGNOSIS — I10 Essential (primary) hypertension: Secondary | ICD-10-CM

## 2011-05-19 DIAGNOSIS — R0683 Snoring: Secondary | ICD-10-CM

## 2011-05-19 DIAGNOSIS — G4700 Insomnia, unspecified: Secondary | ICD-10-CM

## 2011-05-19 DIAGNOSIS — Z9109 Other allergy status, other than to drugs and biological substances: Secondary | ICD-10-CM

## 2011-05-19 DIAGNOSIS — J309 Allergic rhinitis, unspecified: Secondary | ICD-10-CM

## 2011-05-19 DIAGNOSIS — R0989 Other specified symptoms and signs involving the circulatory and respiratory systems: Secondary | ICD-10-CM

## 2011-05-19 MED ORDER — LISINOPRIL 20 MG PO TABS: 20.0000 mg | ORAL_TABLET | Freq: Every day | ORAL | Status: DC

## 2011-05-19 MED ORDER — LEVOTHYROXINE SODIUM 112 MCG PO TABS: 112.0000 ug | ORAL_TABLET | Freq: Every day | ORAL | Status: DC

## 2011-05-19 NOTE — Patient Instructions (Addendum)
In general doing fine for her pleased that you're back much better  Continue lisinopril, Synthroid and generic Claritin Lab studies were good as we discussed To evaluate the amount of food being and attempt to lose is possible Have scheduled sleep study Return when necessary ,

## 2011-05-19 NOTE — Telephone Encounter (Signed)
Pt has an ov today 05/19/11 and is aware of lab results.

## 2011-05-20 ENCOUNTER — Encounter: Payer: Self-pay | Admitting: Family Medicine

## 2011-05-20 ENCOUNTER — Other Ambulatory Visit: Payer: Self-pay | Admitting: Family Medicine

## 2011-05-20 DIAGNOSIS — G4700 Insomnia, unspecified: Secondary | ICD-10-CM

## 2011-05-23 NOTE — Progress Notes (Signed)
  Subjective:    Patient ID: [name], female    DOB: 06/03/1946, 65 y.o.   MRN: [id number] This 65 year old white female is in today for her annual routine physical examination relating that her recent back strain is doing fine she's continued on diclofenac and Flexeril hasn't had any problem with her asthma recently has continued to take Claritin each day. She is concerned that she is gaining 14 pounds since February HPI seventh. Had Pap smear r last year and to repeat next year had mammogram was negative. Blood pressure  had been well-controlled.Review of Systems  Constitutional: Negative.   HENT: Positive for congestion.        Snores badly  Eyes: Negative.   Respiratory: Positive for wheezing.   Cardiovascular: Negative.   Gastrointestinal: Negative.   Genitourinary: Negative.   Musculoskeletal: Positive for back pain and arthralgias.  Neurological: Negative.   Hematological: Negative.   Psychiatric/Behavioral: Positive for sleep disturbance. The patient is nervous/anxious.        Objective:   Physical Exam The patient is a well-built well-nourished slightly obese white female in no distress   HEENT reveals nasal congestion boggy pale nasal mucosa postnasal drainage, carotid pulses are good thyroid is nonpalpable Lungs clear to palpation percussion and auscultation no rales no dullness no wheezing   heart no evidence of cardiomegaly heart sounds are good without murmurs rhythm regular test is negative bilaterally breasts no masses no tenderness was inserted negative Axilla clear no lymphadenopathy Abdomen obese liver spleen kidneys are nonpalpable no abdominal tenderness no masses bowel sounds are normal Pelvic and rectal not examined Extremities negative Spine no evidence of muscle spasm no tenderness over LS and SI spine Neurological no positive findings reflexes 2+ bilaterally         Assessment & Plan:  Routine physical examination reveals the following Hypertension  well controlled Asthma well controlled continue same medicines Allergic rhinitis continue Claritin daily Anxiety or panic attacks continue alprazolam 0.25 mg

## 2011-06-09 ENCOUNTER — Other Ambulatory Visit: Payer: Self-pay | Admitting: Family Medicine

## 2011-06-09 DIAGNOSIS — R0683 Snoring: Secondary | ICD-10-CM

## 2011-06-09 DIAGNOSIS — G4700 Insomnia, unspecified: Secondary | ICD-10-CM

## 2011-06-28 ENCOUNTER — Ambulatory Visit (HOSPITAL_BASED_OUTPATIENT_CLINIC_OR_DEPARTMENT_OTHER): Payer: BC Managed Care – PPO | Attending: Family Medicine

## 2011-06-28 DIAGNOSIS — G4700 Insomnia, unspecified: Secondary | ICD-10-CM

## 2011-06-28 DIAGNOSIS — R0683 Snoring: Secondary | ICD-10-CM

## 2011-06-28 DIAGNOSIS — G4733 Obstructive sleep apnea (adult) (pediatric): Secondary | ICD-10-CM | POA: Insufficient documentation

## 2011-06-30 ENCOUNTER — Encounter (HOSPITAL_BASED_OUTPATIENT_CLINIC_OR_DEPARTMENT_OTHER): Payer: BC Managed Care – PPO

## 2011-07-10 DIAGNOSIS — G4733 Obstructive sleep apnea (adult) (pediatric): Secondary | ICD-10-CM

## 2011-07-10 NOTE — Procedures (Signed)
NAMESHLEY, DOLBY NO.:  [id number]  MEDICAL RECORD NO.:  [id number]          PATIENT TYPE:  OUT  LOCATION:  SLEEP CENTER                 FACILITY:  [facility]  PHYSICIAN:  Barbaraann Share, MD,FCCPDATE OF BIRTH:  October 14, 1945  DATE OF STUDY:  06/28/2011                           NOCTURNAL POLYSOMNOGRAM  REFERRING PHYSICIAN:  Tawny Asal MD  INDICATION FOR STUDY:  Hypersomnia with sleep apnea.  EPWORTH SLEEPINESS SCORE:  10.  SLEEP ARCHITECTURE:  The patient had a total sleep time of 286 minutes with decreased slow wave sleep and very little REM noted.  Sleep onset latency was normal at 6.5 minutes and REM onset was very prolonged at 345 minutes.  Sleep efficiency was poor at 70%.  RESPIRATORY DATA:  The patient was found to have 50 apneas and 27 obstructive hypopneas, giving her an apnea/hypopnea index of 16 events per hour.  The events occurred in all body positions and there was moderate snoring noted throughout.  The patient did not meet split night protocol secondary to her small numbers of events until later in the night.  OXYGEN DATA:  There was O2 desaturation as low as 88% with the patient's obstructive events.  CARDIAC DATA:  No clinically significant arrhythmias were noted.  MOVEMENT-PARASOMNIA:  The patient had no significant leg jerks or other abnormal behavior seen.  IMPRESSION-RECOMMENDATIONS:  Mild obstructive sleep apnea/hypopnea syndrome with an apnea/hypopnea index of 16 events per hour and O2 desaturation as low as 88%.  Treatment for this degree of sleep apnea can include a trial of weight loss alone, upper airway surgery, dental appliance, and also CPAP.  Clinical correlation is suggested.     Barbaraann Share, MD,FCCP Diplomate, American Board of Sleep Medicine Electronically Signed    [facility]/MEDQ  D:  07/10/2011 12:41:18  T:  07/10/2011 12:53:30  Job:  [id number]

## 2011-07-15 ENCOUNTER — Telehealth: Payer: Self-pay | Admitting: Family Medicine

## 2011-07-15 NOTE — Telephone Encounter (Signed)
Pt would like sleep study result

## 2011-07-20 ENCOUNTER — Telehealth: Payer: Self-pay | Admitting: *Deleted

## 2011-07-20 ENCOUNTER — Encounter: Payer: Self-pay | Admitting: Internal Medicine

## 2011-07-20 ENCOUNTER — Ambulatory Visit (INDEPENDENT_AMBULATORY_CARE_PROVIDER_SITE_OTHER): Payer: BC Managed Care – PPO | Admitting: Internal Medicine

## 2011-07-20 DIAGNOSIS — Z0000 Encounter for general adult medical examination without abnormal findings: Secondary | ICD-10-CM

## 2011-07-20 DIAGNOSIS — J45909 Unspecified asthma, uncomplicated: Secondary | ICD-10-CM

## 2011-07-20 DIAGNOSIS — R030 Elevated blood-pressure reading, without diagnosis of hypertension: Secondary | ICD-10-CM

## 2011-07-20 DIAGNOSIS — R300 Dysuria: Secondary | ICD-10-CM

## 2011-07-20 DIAGNOSIS — Z23 Encounter for immunization: Secondary | ICD-10-CM

## 2011-07-20 LAB — POCT URINALYSIS DIPSTICK
Ketones, UA: NEGATIVE
Protein, UA: NEGATIVE
Spec Grav, UA: 1.015
Urobilinogen, UA: 0.2
pH, UA: 5

## 2011-07-20 MED ORDER — CIPROFLOXACIN HCL 500 MG PO TABS: 500.0000 mg | ORAL_TABLET | Freq: Two times a day (BID) | ORAL | Status: AC

## 2011-07-20 NOTE — Progress Notes (Signed)
  Subjective:    Patient ID: [name], female    DOB: 06-29-1946, 65 y.o.   MRN: [id number]  HPI  65 year old patient who is seen with a three-day history of urinary frequency dysuria and urgency.  No prior history of UTI. No history of overactive bladder. Denies any fever abdominal pain chills or flank pain. She has a history of asthma and hypertension which has been well-controlled    Review of Systems  Constitutional: Negative.   HENT: Negative for hearing loss, congestion, sore throat, rhinorrhea, dental problem, sinus pressure and tinnitus.   Eyes: Negative for pain, discharge and visual disturbance.  Respiratory: Negative for cough and shortness of breath.   Cardiovascular: Negative for chest pain, palpitations and leg swelling.  Gastrointestinal: Negative for nausea, vomiting, abdominal pain, diarrhea, constipation, blood in stool and abdominal distention.  Genitourinary: Positive for dysuria, urgency and frequency. Negative for hematuria, flank pain, vaginal bleeding, vaginal discharge, difficulty urinating, vaginal pain and pelvic pain.  Musculoskeletal: Negative for joint swelling, arthralgias and gait problem.  Skin: Negative for rash.  Neurological: Negative for dizziness, syncope, speech difficulty, weakness, numbness and headaches.  Hematological: Negative for adenopathy.  Psychiatric/Behavioral: Negative for behavioral problems, dysphoric mood and agitation. The patient is not nervous/anxious.        Objective:   Physical Exam  Constitutional: She appears well-developed and well-nourished. No distress.       Blood pressure 130/80  Pulmonary/Chest: Breath sounds normal. No respiratory distress.  Abdominal: Bowel sounds are normal.          Assessment & Plan:   Probable UTI. Symptoms are quite suspicious that urinalysis is fairly nonrevealing. Will treat with 3 days of Cipro Hypertension well controlled Asthma stable

## 2011-07-20 NOTE — Telephone Encounter (Signed)
Pt is complaining of freqency, dysuria, urgency, x 2 days.  Appt scheduled with Dr. Kirtland Bouchard.

## 2011-07-20 NOTE — Patient Instructions (Signed)
Take your antibiotic as prescribed until ALL of it is gone, but stop if you develop a rash, swelling, or any side effects of the medication.  Contact our office as soon as possible if  there are side effects of the medication. 

## 2011-09-03 ENCOUNTER — Other Ambulatory Visit: Payer: Self-pay | Admitting: Family Medicine

## 2011-09-18 ENCOUNTER — Ambulatory Visit (INDEPENDENT_AMBULATORY_CARE_PROVIDER_SITE_OTHER): Payer: BC Managed Care – PPO | Admitting: Family Medicine

## 2011-09-18 ENCOUNTER — Encounter: Payer: Self-pay | Admitting: Family Medicine

## 2011-09-18 VITALS — BP 132/80 | HR 86 | Temp 98.0°F | Wt 207.0 lb

## 2011-09-18 DIAGNOSIS — J019 Acute sinusitis, unspecified: Secondary | ICD-10-CM

## 2011-09-18 MED ORDER — AMOXICILLIN 875 MG PO TABS: 875.0000 mg | ORAL_TABLET | Freq: Two times a day (BID) | ORAL | Status: DC

## 2011-09-18 NOTE — Progress Notes (Signed)
OFFICE NOTE  09/19/2011  CC:  Chief Complaint  Patient presents with  . Sinusitis    Yellow mucus  . Cough    productive w/yellow mucus  . Headache  . Sore Throat     HPI:   Patient is a 65 y.o. Caucasian female who is here for respiratory complaints. Pt presents complaining of respiratory symptoms for 5  days.  Mostly nasal congestion/runny nose, sneezing, and PND cough.  Worst symptoms seems to be the sinus pressure, thick yellow mucous, ST.  Lately the symptoms seem to be worsening. No fevers, no wheezing, and no SOB.  No pain in face or teeth.  HA and PND cough+. Symptoms made worse by night, cool air.  Symptoms improved by saline nasal spray. Smoker? former Muscle or joint aches? No She did get flu vaccine this season.  ROS: no n/v/d or abdominal pain.  No rash.  No neck stiffness.   +Mild fatigue.  +Mild appetite loss.  Pertinent PMH:  HTN Hypothyroidism Panic d/o Asthma--has not been on her controller med lately  MEDS;  Mucinex OTC, saline nasal spray  Outpatient Prescriptions Prior to Visit  Medication Sig Dispense Refill  . acetaminophen (TYLENOL) 500 MG tablet Take 500 mg by mouth as needed.        Marland Kitchen albuterol (PROVENTIL HFA) 108 (90 BASE) MCG/ACT inhaler Inhale 2 puffs into the lungs every 6 (six) hours as needed.        . ALPRAZolam (XANAX) 0.25 MG tablet Take 0.25 mg by mouth 3 (three) times daily as needed.        Marland Kitchen ascorbic Acid (VITAMIN C) 500 MG CPCR Take 500 mg by mouth daily.        Marland Kitchen aspirin 81 MG tablet Take 81 mg by mouth daily.        . budesonide-formoterol (SYMBICORT) 160-4.5 MCG/ACT inhaler Inhale 2 puffs into the lungs 2 (two) times daily.        . clotrimazole-betamethasone (LOTRISONE) cream Apply topically 2 (two) times daily.        . cyanocobalamin 500 MCG tablet Take 500 mcg by mouth daily.        . cyclobenzaprine (FLEXERIL) 10 MG tablet Take 1 tablet (10 mg total) by mouth every 8 (eight) hours as needed for muscle spasms.  60 tablet  2    . diclofenac (VOLTAREN) 75 MG EC tablet Take 1 tablet (75 mg total) by mouth 2 (two) times daily.  60 tablet  2  . fish oil-omega-3 fatty acids 1000 MG capsule Take 2 g by mouth daily.        . folic acid (FOLVITE) 400 MCG tablet Take 400 mcg by mouth 2 (two) times daily.        . hydrocortisone 1 % cream Apply topically 2 (two) times daily.        Marland Kitchen levothyroxine (SYNTHROID, LEVOTHROID) 112 MCG tablet TAKE 1 TABLET BY MOUTH EVERY DAY  90 tablet  0  . lisinopril (PRINIVIL,ZESTRIL) 20 MG tablet Take 1 tablet (20 mg total) by mouth daily.  90 tablet  3  . loratadine (CLARITIN) 10 MG tablet Take 10 mg by mouth daily.        . Multiple Vitamin (MULTIVITAMIN) tablet Take 1 tablet by mouth daily.        . benzonatate (TESSALON) 200 MG capsule Take 200 mg by mouth 3 (three) times daily as needed.          PE: Blood pressure 132/80, pulse 86, temperature  98 F (36.7 C), temperature source Oral, weight 207 lb (93.895 kg), SpO2 95.00%. VS: noted--normal. Gen: alert, NAD, NONTOXIC APPEARING. HEENT: eyes without injection, drainage, or swelling.  Ears: EACs clear, TMs with normal light reflex and landmarks.  Nose: Clear rhinorrhea, with some dried, crusty exudate adherent to mildly injected mucosa.  No purulent d/c.  No paranasal sinus TTP.  No facial swelling.  Throat and mouth without focal lesion.  No pharyngial swelling, erythema, or exudate.   Neck: supple, no LAD.   LUNGS: CTA bilat, nonlabored resps.   CV: RRR, no m/r/g. EXT: no c/c/e SKIN: no rash    IMPRESSION AND PLAN:  SINUSITIS, ACUTE Prolonged URI/sinisitis without asthma flare at this time. Amoxil 875 mg bid x 10d. Nasonex 2 sprays each nostril qd.  Continue nasal saline spray bid-tid. Continue mucinex.   Restart symbicort now.     FOLLOW UP:  Return if symptoms worsen or fail to improve.

## 2011-09-19 NOTE — Assessment & Plan Note (Signed)
Prolonged URI/sinisitis without asthma flare at this time. Amoxil 875 mg bid x 10d. Nasonex 2 sprays each nostril qd.  Continue nasal saline spray bid-tid. Continue mucinex.   Restart symbicort now.

## 2011-10-28 ENCOUNTER — Ambulatory Visit: Payer: BC Managed Care – PPO | Admitting: Internal Medicine

## 2011-11-02 ENCOUNTER — Ambulatory Visit: Payer: BC Managed Care – PPO | Admitting: Family Medicine

## 2011-11-02 ENCOUNTER — Ambulatory Visit (INDEPENDENT_AMBULATORY_CARE_PROVIDER_SITE_OTHER): Payer: BC Managed Care – PPO | Admitting: Family Medicine

## 2011-11-02 ENCOUNTER — Encounter: Payer: Self-pay | Admitting: Family Medicine

## 2011-11-02 DIAGNOSIS — J45909 Unspecified asthma, uncomplicated: Secondary | ICD-10-CM

## 2011-11-02 DIAGNOSIS — F329 Major depressive disorder, single episode, unspecified: Secondary | ICD-10-CM

## 2011-11-02 DIAGNOSIS — G4730 Sleep apnea, unspecified: Secondary | ICD-10-CM

## 2011-11-02 DIAGNOSIS — G4733 Obstructive sleep apnea (adult) (pediatric): Secondary | ICD-10-CM | POA: Insufficient documentation

## 2011-11-02 DIAGNOSIS — I10 Essential (primary) hypertension: Secondary | ICD-10-CM

## 2011-11-02 DIAGNOSIS — F32A Depression, unspecified: Secondary | ICD-10-CM

## 2011-11-02 DIAGNOSIS — E039 Hypothyroidism, unspecified: Secondary | ICD-10-CM

## 2011-11-02 DIAGNOSIS — F419 Anxiety disorder, unspecified: Secondary | ICD-10-CM | POA: Insufficient documentation

## 2011-11-02 DIAGNOSIS — F341 Dysthymic disorder: Secondary | ICD-10-CM

## 2011-11-02 DIAGNOSIS — Z23 Encounter for immunization: Secondary | ICD-10-CM

## 2011-11-02 DIAGNOSIS — Z9989 Dependence on other enabling machines and devices: Secondary | ICD-10-CM | POA: Insufficient documentation

## 2011-11-02 NOTE — Patient Instructions (Signed)
Schedule your complete physical in August- don't eat before this Someone will call you with your pulmonary appt to discuss your sleep apnea You look great!  Keep up the good work! Call with any questions or concerns Think of [place] as your home base Welcome! We're glad to have you!!!

## 2011-11-02 NOTE — Progress Notes (Signed)
  Subjective:    Patient ID: [name], female    DOB: Apr 18, 1946, 66 y.o.   MRN: [id number]  HPI New to establish.  Previous MD- Scotty Court.  Hypothyroid- chronic problem, on Synthroid.  Labs last adjusted in Spring 2012.    HTN- chronic problem, well controlled on current meds.  On Lisinopril.  No CP, SOB, HAs, visual changes, edema.  Asthma- had this as a child, has mostly outgrown this but will recur w/ URI.  Not using Symbicort.  Will rarely require Albuterol.  Anxiety- intermittent problem for pt, will still have some episodes of anxiety.  Feels sxs are fairly well controlled if she gets a good night sleep.  No longer requiring xanax.  Sleep apnea- dx'd in September.  No trouble falling asleep but difficulty staying asleep.  Has not tried CPAP or dental appliances.     Review of Systems For ROS see HPI     Objective:   Physical Exam  Vitals reviewed. Constitutional: She is oriented to person, place, and time. She appears well-developed and well-nourished. No distress.  HENT:  Head: Normocephalic and atraumatic.  Eyes: Conjunctivae and EOM are normal. Pupils are equal, round, and reactive to light.  Neck: Normal range of motion. Neck supple. No thyromegaly present.  Cardiovascular: Normal rate, regular rhythm, normal heart sounds and intact distal pulses.   No murmur heard. Pulmonary/Chest: Effort normal and breath sounds normal. No respiratory distress.  Abdominal: Soft. She exhibits no distension. There is no tenderness.  Musculoskeletal: She exhibits no edema.  Lymphadenopathy:    She has no cervical adenopathy.  Neurological: She is alert and oriented to person, place, and time.  Skin: Skin is warm and dry.  Psychiatric: She has a normal mood and affect. Her behavior is normal.          Assessment & Plan:

## 2011-11-03 NOTE — Assessment & Plan Note (Signed)
Chronic problem.  Well controlled on current meds.  Asymptomatic.  No changes. 

## 2011-11-03 NOTE — Assessment & Plan Note (Signed)
Chronic problem.  Well controlled.  No longer using controller med, rarely requiring rescue inhaler.  Will continue to follow.

## 2011-11-03 NOTE — Assessment & Plan Note (Signed)
Pt feels sxs are better controlled.  Rarely using benzo.  No need for controller med.  Will continue to follow.

## 2011-11-03 NOTE — Assessment & Plan Note (Signed)
New.  Pt had sleep study this fall but reports she has never had any follow up about this.  Is not currently on CPAP or wearing dental appliance.  Is still feeling poorly rested.  Will refer.  Pt expressed understanding and is in agreement w/ plan.

## 2011-11-03 NOTE — Assessment & Plan Note (Signed)
Chronic problem.  Currently on levothyroxine.  Denies current sxs. Will check labs at next visit.

## 2011-11-15 ENCOUNTER — Encounter: Payer: Self-pay | Admitting: *Deleted

## 2011-11-19 ENCOUNTER — Encounter: Payer: Self-pay | Admitting: Pulmonary Disease

## 2011-11-19 ENCOUNTER — Ambulatory Visit (INDEPENDENT_AMBULATORY_CARE_PROVIDER_SITE_OTHER): Payer: BC Managed Care – PPO | Admitting: Pulmonary Disease

## 2011-11-19 VITALS — BP 138/72 | HR 89 | Temp 98.4°F | Ht 66.0 in | Wt 205.0 lb

## 2011-11-19 DIAGNOSIS — G4730 Sleep apnea, unspecified: Secondary | ICD-10-CM

## 2011-11-19 NOTE — Progress Notes (Signed)
  Subjective:    Patient ID: [name], female    DOB: Jan 17, 1946, 66 y.o.   MRN: [id number]  HPI    Review of Systems  Constitutional: Positive for unexpected weight change. Negative for fever.  HENT: Positive for congestion and sneezing. Negative for ear pain, nosebleeds, sore throat, rhinorrhea, trouble swallowing, dental problem, postnasal drip and sinus pressure.   Eyes: Negative for redness and itching.  Respiratory: Positive for cough and shortness of breath. Negative for chest tightness and wheezing.   Cardiovascular: Negative for palpitations and leg swelling.  Gastrointestinal: Negative for nausea and vomiting.  Genitourinary: Negative for dysuria.  Musculoskeletal: Positive for arthralgias. Negative for joint swelling.  Skin: Negative for rash.  Neurological: Positive for headaches.  Hematological: Does not bruise/bleed easily.  Psychiatric/Behavioral: Negative for dysphoric mood. The patient is not nervous/anxious.        Objective:   Physical Exam        Assessment & Plan:

## 2011-11-19 NOTE — Assessment & Plan Note (Addendum)
9/12 Mod OSA with AHI 16/h. She will be set up with an auto CPAP machine 5-12 cm, fullf ace mask due to mouth breathing with humidity  Weight loss encouraged, compliance with goal of at least 4-6 hrs every night is the expectation. Advised against medications with sedative side effects Cautioned against driving when sleepy - understanding that sleepiness will vary on a day to day basis

## 2011-11-19 NOTE — Progress Notes (Signed)
  Subjective:    Patient ID: [name], female    DOB: Apr 28, 1946, 66 y.o.   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 She reports being tired all the time, has woken up with gasping episodes during sleep. Sleep onset is okay but cannot fall asleep again after an arousal. Loud snoring has been noted. She naps daily for an hour. Bed Time 11 PM, sleep latency 5-15 minutes, she sleeps on her left side with 2 pillows, several awakenings every 2 hours, out of bed by 7 AM feeling exhausted with occasional headaches. Polysomnogram in September 2012 showed total sleep time of 286 minutes  with decreased slow wave sleep and very little REM noted.  Sleep onset  latency was normal at 6.5 minutes and REM onset was very prolonged at  345 minutes.  Sleep efficiency was poor at 70%.  50 apneas and 27  obstructive hypopneas, giving her an apnea/hypopnea index of 16 events  per hour.  The events occurred in all body positions and there was  moderate snoring noted throughout - low desatn of 88%  There is no history suggestive of cataplexy, sleep paralysis or parasomnias    Review of Systems Constitutional: negative for anorexia, fevers and sweats  Eyes: negative for irritation, redness and visual disturbance  Ears, nose, mouth, throat, and face: negative for earaches, epistaxis, nasal congestion and sore throat  Respiratory: negative for cough, dyspnea on exertion, sputum and wheezing  Cardiovascular: negative for chest pain, dyspnea, lower extremity edema, orthopnea, palpitations and syncope  Gastrointestinal: negative for abdominal pain, constipation, diarrhea, melena, nausea and vomiting  Genitourinary:negative for dysuria, frequency and hematuria  Hematologic/lymphatic: negative for bleeding, easy bruising and lymphadenopathy  Musculoskeletal:negative for arthralgias, muscle weakness and stiff  joints  Neurological: negative for coordination problems, gait problems, headaches and weakness  Endocrine: negative for diabetic symptoms including polydipsia, polyuria and weight loss     Objective:   Physical Exam  Gen. Pleasant, obese, in no distress, normal affect ENT - no lesions, no post nasal drip Neck: No JVD, no thyromegaly, no carotid bruits Lungs: no use of accessory muscles, no dullness to percussion, clear without rales or rhonchi  Cardiovascular: Rhythm regular, heart sounds  normal, no murmurs or gallops, no peripheral edema Abdomen: soft and non-tender, no hepatosplenomegaly, BS normal. Musculoskeletal: No deformities, no cyanosis or clubbing Neuro:  alert, non focal       Assessment & Plan:

## 2011-11-19 NOTE — Patient Instructions (Signed)
We will have DME contact you to set up cpap machine Turn in the card 2 days before your next appt

## 2011-12-17 ENCOUNTER — Encounter: Payer: Self-pay | Admitting: Pulmonary Disease

## 2011-12-17 ENCOUNTER — Telehealth: Payer: Self-pay | Admitting: Pulmonary Disease

## 2011-12-17 ENCOUNTER — Ambulatory Visit (INDEPENDENT_AMBULATORY_CARE_PROVIDER_SITE_OTHER): Payer: BC Managed Care – PPO | Admitting: Pulmonary Disease

## 2011-12-17 DIAGNOSIS — G4730 Sleep apnea, unspecified: Secondary | ICD-10-CM

## 2011-12-17 NOTE — Telephone Encounter (Signed)
Sent to JR and RA as FYI today.

## 2011-12-17 NOTE — Patient Instructions (Signed)
We will make changes to your CPAP machine once I look at the download information

## 2011-12-17 NOTE — Progress Notes (Signed)
  Subjective:    Patient ID: [name], female    DOB: 1946-02-08, 66 y.o.   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  She reports being tired all the time, has woken up with gasping episodes during sleep. Sleep onset is okay but cannot fall asleep again after an arousal. Loud snoring has been noted. She naps daily for an hour.  Bed Time 11 PM, sleep latency 5-15 minutes, she sleeps on her left side with 2 pillows, several awakenings every 2 hours, out of bed by 7 AM feeling exhausted with occasional headaches.  Polysomnogram in September 2012 showed total sleep time of 286 minutes with decreased slow wave sleep and very little REM noted. Sleep onset latency was normal at 6.5 minutes and REM onset was very prolonged at 345 minutes. Sleep efficiency was poor at 70%. 50 apneas and 27 obstructive hypopneas, giving her an apnea/hypopnea index of 16 events per hour. The events occurred in all body positions and there was moderate snoring noted throughout - low desatn of 88% AutoCPAP 5-12 cm, nasal pillows with chin strap Turned humidity setting up Waking up rested   Review of Systems Patient denies significant dyspnea,cough, hemoptysis,  chest pain, palpitations, pedal edema, orthopnea, paroxysmal nocturnal dyspnea, lightheadedness, nausea, vomiting, abdominal or  leg pains      Objective:   Physical Exam Gen. Pleasant, well-nourished, in no distress ENT - no lesions, no post nasal drip Neck: No JVD, no thyromegaly, no carotid bruits Lungs: no use of accessory muscles, no dullness to percussion, clear without rales or rhonchi  Cardiovascular: Rhythm regular, heart sounds  normal, no murmurs or gallops, no peripheral edema Musculoskeletal: No deformities, no cyanosis or clubbing         Assessment & Plan:

## 2011-12-20 NOTE — Telephone Encounter (Signed)
This pt was set up Pl obtain download from [facility] - was requested Friday - pl leave in my look at

## 2011-12-20 NOTE — Telephone Encounter (Signed)
Will route to Dr Alva as FYI 

## 2011-12-21 NOTE — Telephone Encounter (Signed)
Spoke with Faith with [facility] and requested that CPAP download be faxed.  Will await fax.

## 2011-12-21 NOTE — Assessment & Plan Note (Signed)
Weight loss encouraged, compliance with goal of at least 4-6 hrs every night is the expectation. Advised against medications with sedative side effects Cautioned against driving when sleepy - understanding that sleepiness will vary on a day to day basis Review download & change to fixed cpap level - she seems to be adjusting well

## 2011-12-21 NOTE — Telephone Encounter (Signed)
Fax received and placed in RA's look at 

## 2011-12-24 ENCOUNTER — Encounter: Payer: Self-pay | Admitting: Pulmonary Disease

## 2011-12-24 ENCOUNTER — Other Ambulatory Visit: Payer: Self-pay | Admitting: Pulmonary Disease

## 2011-12-24 DIAGNOSIS — G4730 Sleep apnea, unspecified: Secondary | ICD-10-CM

## 2011-12-27 ENCOUNTER — Telehealth: Payer: Self-pay | Admitting: Pulmonary Disease

## 2011-12-27 NOTE — Telephone Encounter (Signed)
RA sent order to [facility] to set pressure on 11cm

## 2011-12-27 NOTE — Telephone Encounter (Signed)
I spoke with patient about results and she verbalized understanding and had no questions 

## 2012-02-17 ENCOUNTER — Ambulatory Visit: Payer: BC Managed Care – PPO | Admitting: Pulmonary Disease

## 2012-02-18 ENCOUNTER — Encounter: Payer: Self-pay | Admitting: Family Medicine

## 2012-02-18 ENCOUNTER — Ambulatory Visit (INDEPENDENT_AMBULATORY_CARE_PROVIDER_SITE_OTHER): Payer: BC Managed Care – PPO | Admitting: Family Medicine

## 2012-02-18 VITALS — BP 127/82 | HR 74 | Temp 98.9°F | Ht 65.0 in | Wt 205.0 lb

## 2012-02-18 DIAGNOSIS — J329 Chronic sinusitis, unspecified: Secondary | ICD-10-CM

## 2012-02-18 MED ORDER — DICLOFENAC SODIUM 75 MG PO TBEC: 75.0000 mg | DELAYED_RELEASE_TABLET | Freq: Two times a day (BID) | ORAL | Status: DC | PRN

## 2012-02-18 MED ORDER — AMOXICILLIN 875 MG PO TABS: 875.0000 mg | ORAL_TABLET | Freq: Two times a day (BID) | ORAL | Status: DC

## 2012-02-18 MED ORDER — CYCLOBENZAPRINE HCL 10 MG PO TABS: 10.0000 mg | ORAL_TABLET | Freq: Three times a day (TID) | ORAL | Status: DC | PRN

## 2012-02-18 NOTE — Patient Instructions (Signed)
This is a sinus infection Start the amoxicillin twice daily- take w/ food Drink plenty of fluids Alternate tylenol or ibuprofen as needed for pain or fever Call with any questions or concerns Hang in there!!! Happy Mother's Day!!!

## 2012-02-18 NOTE — Progress Notes (Signed)
  Subjective:    Patient ID: [name], female    DOB: February 03, 1946, 66 y.o.   MRN: [id number]  HPI URI- sxs started Monday.  + sinus pain, congestion, HA, bilateral ear fullness.  No cough.  No fevers.  + nausea.  No known sick contacts.   Review of Systems For ROS see HPI     Objective:   Physical Exam  Vitals reviewed. Constitutional: She appears well-developed and well-nourished. No distress.  HENT:  Head: Normocephalic and atraumatic.  Right Ear: Tympanic membrane normal.  Left Ear: Tympanic membrane normal.  Nose: Mucosal edema and rhinorrhea present. Right sinus exhibits maxillary sinus tenderness and frontal sinus tenderness. Left sinus exhibits maxillary sinus tenderness and frontal sinus tenderness.  Mouth/Throat: Uvula is midline and mucous membranes are normal. Posterior oropharyngeal erythema present. No oropharyngeal exudate.  Eyes: Conjunctivae and EOM are normal. Pupils are equal, round, and reactive to light.  Neck: Normal range of motion. Neck supple.  Cardiovascular: Normal rate, regular rhythm and normal heart sounds.   Pulmonary/Chest: Effort normal and breath sounds normal. No respiratory distress. She has no wheezes.  Lymphadenopathy:    She has no cervical adenopathy.          Assessment & Plan:

## 2012-02-20 NOTE — Assessment & Plan Note (Signed)
New.  Pt's sxs and PE consistent w/ infxn.  Start abx.  Reviewed supportive care and red flags that should prompt return.  Pt expressed understanding and is in agreement w/ plan.  

## 2012-04-07 ENCOUNTER — Ambulatory Visit (INDEPENDENT_AMBULATORY_CARE_PROVIDER_SITE_OTHER): Payer: BC Managed Care – PPO | Admitting: Pulmonary Disease

## 2012-04-07 ENCOUNTER — Encounter: Payer: Self-pay | Admitting: Pulmonary Disease

## 2012-04-07 VITALS — BP 108/68 | HR 60 | Temp 97.8°F | Ht 66.0 in | Wt 202.2 lb

## 2012-04-07 DIAGNOSIS — G4730 Sleep apnea, unspecified: Secondary | ICD-10-CM

## 2012-04-07 DIAGNOSIS — J45909 Unspecified asthma, uncomplicated: Secondary | ICD-10-CM

## 2012-04-07 NOTE — Progress Notes (Signed)
  Subjective:    Patient ID: [name], female    DOB: Nov 04, 1945, 66 y.o.   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 Sleep onset latency was normal at 6.5 minutes and REM onset was very prolonged at 345 minutes. Sleep efficiency was poor at 70%. 50 apneas and 27 obstructive hypopneas, giving her an apnea/hypopnea index of 16 events per hour. The events occurred in all body positions and there was moderate snoring noted throughout - low desatn of 88%   04/07/2012 Set at 11 cm after last download - autotitration Download 6/13 shows ahi 4/h, excellent compliance > 7h, leak ++ on nasal pillows Mask ok, pressure ok, increased humidifier setting, no dryness ESS ok   Review of Systems Patient denies significant dyspnea,cough, hemoptysis,  chest pain, palpitations, pedal edema, orthopnea, paroxysmal nocturnal dyspnea, lightheadedness, nausea, vomiting, abdominal or  leg pains      Objective:   Physical Exam  Gen. Pleasant, well-nourished, in no distress ENT - no lesions, no post nasal drip Neck: No JVD, no thyromegaly, no carotid bruits Lungs: no use of accessory muscles, no dullness to percussion, clear without rales or rhonchi  Cardiovascular: Rhythm regular, heart sounds  normal, no murmurs or gallops, no peripheral edema Musculoskeletal: No deformities, no cyanosis or clubbing         Assessment & Plan:

## 2012-04-07 NOTE — Patient Instructions (Signed)
Your CPAP is set at 11 cm

## 2012-04-07 NOTE — Assessment & Plan Note (Signed)
stable °

## 2012-04-07 NOTE — Assessment & Plan Note (Signed)
Corrected by 11 cm - good compliance Weight loss encouraged, compliance with goal of at least 4-6 hrs every night is the expectation. Advised against medications with sedative side effects Cautioned against driving when sleepy - understanding that sleepiness will vary on a day to day basis

## 2012-06-16 ENCOUNTER — Telehealth: Payer: Self-pay | Admitting: *Deleted

## 2012-06-16 NOTE — Telephone Encounter (Signed)
Mailed copy of recent bone density test noted as normal range per MD Beverely Low notations

## 2012-06-27 ENCOUNTER — Encounter: Payer: Self-pay | Admitting: Family Medicine

## 2012-07-05 ENCOUNTER — Other Ambulatory Visit (HOSPITAL_COMMUNITY)
Admission: RE | Admit: 2012-07-05 | Discharge: 2012-07-05 | Disposition: A | Discharge status: Home / Self Care | Payer: BC Managed Care – PPO | Source: Ambulatory Visit | Attending: Family Medicine | Admitting: Family Medicine

## 2012-07-05 ENCOUNTER — Encounter: Payer: Self-pay | Admitting: Family Medicine

## 2012-07-05 ENCOUNTER — Ambulatory Visit (INDEPENDENT_AMBULATORY_CARE_PROVIDER_SITE_OTHER): Payer: BC Managed Care – PPO | Admitting: Family Medicine

## 2012-07-05 VITALS — BP 130/82 | HR 68 | Temp 98.7°F | Ht 65.0 in | Wt 199.4 lb

## 2012-07-05 DIAGNOSIS — E039 Hypothyroidism, unspecified: Secondary | ICD-10-CM

## 2012-07-05 DIAGNOSIS — Z124 Encounter for screening for malignant neoplasm of cervix: Secondary | ICD-10-CM

## 2012-07-05 DIAGNOSIS — Z131 Encounter for screening for diabetes mellitus: Secondary | ICD-10-CM | POA: Insufficient documentation

## 2012-07-05 DIAGNOSIS — Z23 Encounter for immunization: Secondary | ICD-10-CM

## 2012-07-05 DIAGNOSIS — M21619 Bunion of unspecified foot: Secondary | ICD-10-CM

## 2012-07-05 DIAGNOSIS — Z0000 Encounter for general adult medical examination without abnormal findings: Secondary | ICD-10-CM | POA: Insufficient documentation

## 2012-07-05 DIAGNOSIS — M21611 Bunion of right foot: Secondary | ICD-10-CM | POA: Insufficient documentation

## 2012-07-05 DIAGNOSIS — Z01419 Encounter for gynecological examination (general) (routine) without abnormal findings: Secondary | ICD-10-CM

## 2012-07-05 DIAGNOSIS — I10 Essential (primary) hypertension: Secondary | ICD-10-CM

## 2012-07-05 DIAGNOSIS — M25569 Pain in unspecified knee: Secondary | ICD-10-CM | POA: Insufficient documentation

## 2012-07-05 DIAGNOSIS — Z4689 Encounter for fitting and adjustment of other specified devices: Secondary | ICD-10-CM | POA: Insufficient documentation

## 2012-07-05 LAB — CBC WITH DIFFERENTIAL/PLATELET
Basophils Absolute: 0 10*3/uL (ref 0.0–0.1)
Eosinophils Absolute: 0.2 10*3/uL (ref 0.0–0.7)
HCT: 37.4 % (ref 36.0–46.0)
Hemoglobin: 12.2 g/dL (ref 12.0–15.0)
Lymphs Abs: 0.9 10*3/uL (ref 0.7–4.0)
MCHC: 32.5 g/dL (ref 30.0–36.0)
Neutro Abs: 3 10*3/uL (ref 1.4–7.7)
RDW: 14.2 % (ref 11.5–14.6)

## 2012-07-05 LAB — BASIC METABOLIC PANEL
Calcium: 9.1 mg/dL (ref 8.4–10.5)
Creatinine, Ser: 0.8 mg/dL (ref 0.4–1.2)
GFR: 78.22 mL/min (ref >60.00)

## 2012-07-05 LAB — LIPID PANEL
Cholesterol: 168 mg/dL (ref 0–200)
LDL Cholesterol: 84 mg/dL (ref 0–99)
Triglycerides: 130 mg/dL (ref 0.0–149.0)
VLDL: 26 mg/dL (ref 0.0–40.0)

## 2012-07-05 LAB — HEPATIC FUNCTION PANEL
Bilirubin, Direct: 0.1 mg/dL (ref 0.0–0.3)
Total Bilirubin: 0.5 mg/dL (ref 0.3–1.2)

## 2012-07-05 NOTE — Progress Notes (Signed)
  Subjective:    Patient ID: [name], female    DOB: 27-Feb-1946, 66 y.o.   MRN: [id number]  HPI CPE- UTD on DEXA and mammo (August @ [facility]).  UTD on colonoscopy.  L knee pain- had fall 30 yrs ago, reports she will have lateral leg swelling and will have her leg 'lock up' when getting in/out of car.  sxs are more 'pronounced' and more 'frequent' than previous.  Bilateral bunions- pt notes greater deviation in toes lateral, becoming painful.  Both parents had bunion surgery   Review of Systems Patient reports no vision/ hearing changes, adenopathy,fever, weight change,  persistant/recurrent hoarseness , swallowing issues, chest pain, palpitations, edema, persistant/recurrent cough, hemoptysis, dyspnea (rest/exertional/paroxysmal nocturnal), gastrointestinal bleeding (melena, rectal bleeding), abdominal pain, significant heartburn, bowel changes, GU symptoms (dysuria, hematuria, incontinence), Gyn symptoms (abnormal  bleeding, pain),  syncope, focal weakness, memory loss, numbness & tingling, skin/hair/nail changes, abnormal bruising or bleeding, anxiety, or depression.     Objective:   Physical Exam  General Appearance:    Alert, cooperative, no distress, appears stated age  Head:    Normocephalic, without obvious abnormality, atraumatic  Eyes:    PERRL, conjunctiva/corneas clear, EOM's intact, fundi    benign, both eyes  Ears:    Normal TM's and external ear canals, both ears  Nose:   Nares normal, septum midline, mucosa normal, no drainage    or sinus tenderness  Throat:   Lips, mucosa, and tongue normal; teeth and gums normal  Neck:   Supple, symmetrical, trachea midline, no adenopathy;    Thyroid: no enlargement/tenderness/nodules  Back:     Symmetric, no curvature, ROM normal, no CVA tenderness  Lungs:     Clear to auscultation bilaterally, respirations unlabored  Chest Wall:    No tenderness or deformity   Heart:    Regular rate and rhythm, S1 and S2 normal, no murmur, rub  or gallop  Breast Exam:    No tenderness, masses, or nipple abnormality  Abdomen:     Soft, non-tender, bowel sounds active all four quadrants,    no masses, no organomegaly  Genitalia:    External genitalia normal, cervix normal in appearance, no CMT, uterus in normal size and position, adnexa w/out mass or tenderness, mucosa pink and moist, no lesions or discharge present  Rectal:    Normal external appearance  Extremities:   Extremities normal, atraumatic, no cyanosis or edema.  Bilateral bunions.  Pulses:   2+ and symmetric all extremities  Skin:   Skin color, texture, turgor normal, no rashes or lesions  Lymph nodes:   Cervical, supraclavicular, and axillary nodes normal  Neurologic:   CNII-XII intact, normal strength, sensation and reflexes    throughout          Assessment & Plan:

## 2012-07-05 NOTE — Patient Instructions (Addendum)
Follow up in 6 months to recheck BP You look good!  Keep up the good work! We'll notify you of your lab results and make any changes if needed Someone will call you with your ortho and podiatry appts Call with any questions or concerns Have a great fall season!

## 2012-07-06 ENCOUNTER — Encounter: Payer: Self-pay | Admitting: *Deleted

## 2012-07-09 LAB — VITAMIN D 1,25 DIHYDROXY: Vitamin D2 1, 25 (OH)2: <8 pg/mL

## 2012-07-09 NOTE — Assessment & Plan Note (Signed)
Pap collected. 

## 2012-07-09 NOTE — Assessment & Plan Note (Signed)
Chronic problem, well controlled.  Asymptomatic.  No changes. 

## 2012-07-09 NOTE — Assessment & Plan Note (Signed)
PE WNL.  UTD on mammo, colonoscopy.  Check labs.  Anticipatory guidance provided.

## 2012-07-09 NOTE — Assessment & Plan Note (Signed)
New to provider.  Refer to ortho for complete evaluation and tx. 

## 2012-07-09 NOTE — Assessment & Plan Note (Signed)
Chronic problem.  Check labs.  Adjust meds prn  

## 2012-07-09 NOTE — Assessment & Plan Note (Signed)
New to provider, refer to podiatry

## 2012-07-11 ENCOUNTER — Encounter: Payer: Self-pay | Admitting: *Deleted

## 2012-07-12 ENCOUNTER — Telehealth: Payer: Self-pay | Admitting: *Deleted

## 2012-07-12 NOTE — Telephone Encounter (Signed)
Informative phone call received from Petros with [facility] to advised the pt advised before she comes to an apt with their office she is going to call her insurance company first and then she may not go to their office per she is not familier with them, advised MD Beverely Low verbally

## 2012-08-02 ENCOUNTER — Other Ambulatory Visit: Payer: Self-pay | Admitting: Family Medicine

## 2012-08-03 ENCOUNTER — Other Ambulatory Visit: Payer: Self-pay | Admitting: Family Medicine

## 2012-08-03 MED ORDER — CYCLOBENZAPRINE HCL 10 MG PO TABS: 10.0000 mg | ORAL_TABLET | Freq: Three times a day (TID) | ORAL | Status: DC | PRN

## 2012-08-03 NOTE — Telephone Encounter (Signed)
Refill  Cyclobenzaprine 10 MG Take 1 tablet (10 mg total) by mouth every 8 (eight) hours as needed for muscle spasms  #60 last fill 9.9.13--last ov 9.25.13 Annual

## 2012-08-03 NOTE — Telephone Encounter (Signed)
OV 07/05/12. Cyclobenzaprine last filled 02/18/12 #60 x 2.   Plz advise       MW

## 2012-08-04 NOTE — Telephone Encounter (Signed)
Noted MD Tabori sent the refill via escribe for the pt 08-03-12

## 2012-08-07 NOTE — Telephone Encounter (Signed)
rx sent to pharmacy by e-script  

## 2012-08-07 NOTE — Telephone Encounter (Signed)
refill for lisinopril 20mg  tabs sent escribe to dr.stafford, but has been marked out on fax and hand wrt. Dr.Tabori --last fill 07.16.13, take 1 tablet by mouth daily  Last ov 9.25.13 annual

## 2012-08-26 ENCOUNTER — Encounter: Payer: Self-pay | Admitting: Family Medicine

## 2012-08-26 ENCOUNTER — Ambulatory Visit (INDEPENDENT_AMBULATORY_CARE_PROVIDER_SITE_OTHER): Payer: BC Managed Care – PPO | Admitting: Family Medicine

## 2012-08-26 VITALS — BP 136/82 | HR 66 | Temp 99.9°F | Wt 199.0 lb

## 2012-08-26 DIAGNOSIS — J069 Acute upper respiratory infection, unspecified: Secondary | ICD-10-CM

## 2012-08-26 MED ORDER — ALBUTEROL SULFATE HFA 108 (90 BASE) MCG/ACT IN AERS: 2.0000 | INHALATION_SPRAY | RESPIRATORY_TRACT | Status: DC | PRN

## 2012-08-26 NOTE — Progress Notes (Signed)
OFFICE NOTE  08/26/2012  CC:  Chief Complaint  Patient presents with  . Cough    x 4 days  . URI     HPI: Patient is a 66 y.o. Caucasian female who is here for resp complaints. Pt presents complaining of respiratory symptoms for 3+ days.  Primary symptoms are: nasal congestion/pressure, cough that feels like it is getting deeper.  No ST or HA.  Lately the symptoms seem to be worsening. Pertinent negatives: No fevers, no wheezing, and no SOB.  No pain in face or teeth.  No significant HA.  ST mild at most.   Symptoms made worse by nothing.  Symptoms improved by nothing. Smoker? no Recent sick contact? no Muscle or joint aches? no Flu shot this season at least 2 wks ago? yes  Additional ROS: no n/v/d or abdominal pain.  No rash.  No neck stiffness.   +Mild fatigue.  +Mild appetite loss.   Pertinent PMH:  Past Medical History  Diagnosis Date  . Asthma   . Arthritis   . Migraine   . Seasonal allergies   . Hx of blood clots   . Family history of thyroid problem   . UTI (urinary tract infection)   . Hypertension     MEDS:  Outpatient Prescriptions Prior to Visit  Medication Sig Dispense Refill  . acetaminophen (TYLENOL) 500 MG tablet Take 500 mg by mouth as needed.        Marland Kitchen albuterol (PROVENTIL HFA) 108 (90 BASE) MCG/ACT inhaler Inhale 2 puffs into the lungs every 6 (six) hours as needed.        Marland Kitchen aspirin 81 MG tablet Take 81 mg by mouth daily.        . budesonide-formoterol (SYMBICORT) 160-4.5 MCG/ACT inhaler Inhale 2 puffs into the lungs 2 (two) times daily as needed.       . cyclobenzaprine (FLEXERIL) 10 MG tablet Take 1 tablet (10 mg total) by mouth every 8 (eight) hours as needed for muscle spasms.  60 tablet  2  . diclofenac (VOLTAREN) 75 MG EC tablet Take 1 tablet (75 mg total) by mouth 2 (two) times daily as needed.  60 tablet  3  . levothyroxine (SYNTHROID, LEVOTHROID) 112 MCG tablet TAKE 1 TABLET BY MOUTH EVERY DAY  90 tablet  0  . lisinopril (PRINIVIL,ZESTRIL)  20 MG tablet TAKE 1 TABLET BY MOUTH DAILY  90 tablet  0  . loratadine (CLARITIN) 10 MG tablet Take 10 mg by mouth daily.        . Multiple Vitamin (MULTIVITAMIN) tablet Take 1 tablet by mouth daily.        . Nutritional Supplements (DHEA PO) Take 1 capsule by mouth daily.      . sodium chloride (OCEAN) 0.65 % nasal spray Place 1 spray into the nose as needed.      Marland Kitchen lisinopril (PRINIVIL,ZESTRIL) 20 MG tablet Take 20 mg by mouth daily.       Out of proventil  PE: Blood pressure 136/82, pulse 66, temperature 99.9 F (37.7 C), temperature source Oral, weight 199 lb (90.266 kg), SpO2 96.00%. VS: noted--normal. Gen: alert, NAD, NONTOXIC APPEARING. HEENT: eyes without injection, drainage, or swelling.  Ears: EACs clear, TMs with normal light reflex and landmarks.  Nose: Clear rhinorrhea, with some dried, crusty exudate adherent to mildly injected mucosa.  No purulent d/c.  No paranasal sinus TTP.  No facial swelling.  Throat and mouth without focal lesion.  No pharyngial swelling, erythema, or exudate.  Neck: supple, no LAD.   LUNGS: CTA bilat, nonlabored resps.   CV: RRR, no m/r/g. EXT: no c/c/e SKIN: no rash  IMPRESSION AND PLAN:  Viral URI Reassured pt. Discussed symptomatic care. Restart symbicort 2 puffs bid, use albuterol inhaler q6h prn. Nasonex sample given, 2 sprays each nostril qd.  Continue daily claritin.   An After Visit Summary was printed and given to the patient.  FOLLOW UP: prn

## 2012-08-26 NOTE — Assessment & Plan Note (Signed)
Reassured pt. Discussed symptomatic care. Restart symbicort 2 puffs bid, use albuterol inhaler q6h prn. Nasonex sample given, 2 sprays each nostril qd.  Continue daily claritin.

## 2012-09-01 ENCOUNTER — Ambulatory Visit (INDEPENDENT_AMBULATORY_CARE_PROVIDER_SITE_OTHER): Payer: BC Managed Care – PPO | Admitting: Family Medicine

## 2012-09-01 ENCOUNTER — Encounter: Payer: Self-pay | Admitting: Family Medicine

## 2012-09-01 VITALS — BP 110/86 | HR 91 | Temp 99.0°F | Wt 200.0 lb

## 2012-09-01 DIAGNOSIS — J40 Bronchitis, not specified as acute or chronic: Secondary | ICD-10-CM

## 2012-09-01 MED ORDER — AZITHROMYCIN 250 MG PO TABS: ORAL_TABLET | ORAL | Status: DC

## 2012-09-01 MED ORDER — PROMETHAZINE-DM 6.25-15 MG/5ML PO SYRP: 5.0000 mL | ORAL_SOLUTION | Freq: Four times a day (QID) | ORAL | Status: DC | PRN

## 2012-09-01 NOTE — Progress Notes (Signed)
  Subjective:    Patient ID: [name], female    DOB: 1946/01/01, 66 y.o.   MRN: [id number]  HPI URI- sxs started 10 days ago.  Went to Saturday clinic and was told it was URI but not given any meds.  Pt reports sxs have worsened since then.  + subjective fevers.  No facial pain/pressure.  Cough is wet but not productive.  Taking Mucinex.  + hoarseness, post-tussive emesis.  No ear pain.  + PND, nasal congestion.  + sick contacts.   Review of Systems For ROS see HPI     Objective:   Physical Exam  Vitals reviewed. Constitutional: She appears well-developed and well-nourished. No distress.  HENT:  Head: Normocephalic and atraumatic.       TMs normal bilaterally Mild nasal congestion Throat w/out erythema, edema, or exudate  Eyes: Conjunctivae normal and EOM are normal. Pupils are equal, round, and reactive to light.  Neck: Normal range of motion. Neck supple.  Cardiovascular: Normal rate, regular rhythm, normal heart sounds and intact distal pulses.   No murmur heard. Pulmonary/Chest: Effort normal and breath sounds normal. No respiratory distress. She has no wheezes.       + hacking cough  Lymphadenopathy:    She has no cervical adenopathy.          Assessment & Plan:

## 2012-09-01 NOTE — Assessment & Plan Note (Signed)
Pt's sxs and duration of illness consistent w/ bacterial infxn.  Start abx.  Cough meds prn.  Reviewed supportive care and red flags that should prompt return.  Pt expressed understanding and is in agreement w/ plan.

## 2012-09-01 NOTE — Patient Instructions (Addendum)
This is a bronchitis Start the Zpack as directed Use the cough syrup as needed Continue the Mucinex REST! Continue to drink plenty of fluids Call with any questions or concerns Hang in there! Happy Holidays!!!

## 2012-09-04 ENCOUNTER — Telehealth: Payer: Self-pay | Admitting: Family Medicine

## 2012-09-04 NOTE — Telephone Encounter (Signed)
Spoke with pt, she stated that she was feeling much better. I advised pt that she did not need to keep what she coughed up and that she could discard it. SGJ, RN

## 2012-09-04 NOTE — Telephone Encounter (Signed)
Caller: [name]/Patient; Phone: [phone]; Reason for Call: Pt wants to know if MD needs to see what she coughed up on Sat.  Pt was seen for office visit on Friday.  She states she coughed up material that doesn't look like sputum.  Its hard and white and 1 1/2 inches long.  Pt states it looks like a foreign object.  Pt has it saved.  Please call her and advise if she can bring it for testing.  Otherwise her sx are improving.

## 2012-10-05 ENCOUNTER — Other Ambulatory Visit: Payer: Self-pay | Admitting: Family Medicine

## 2012-10-05 DIAGNOSIS — E079 Disorder of thyroid, unspecified: Secondary | ICD-10-CM

## 2012-10-05 MED ORDER — LISINOPRIL 20 MG PO TABS: 20.0000 mg | ORAL_TABLET | Freq: Every day | ORAL | Status: DC

## 2012-10-05 NOTE — Telephone Encounter (Signed)
Please advise on the Diclofenac sodium 75mg  request.//AB/CMA   Rx for Lisinopril 20mg  sent to pharmacy by e-script.//AB/CMA

## 2012-10-05 NOTE — Telephone Encounter (Signed)
Lisinopril 20 mg tablets Qty:90 Last refill: 10.28.13 Take 1 tablet by mouth everyday  Diclofenac sodium 75 mg dr tablets Qty:60 Last fill: 10.23.13 Take 1 tablet by mouth twice daily as needed

## 2012-10-06 MED ORDER — DICLOFENAC SODIUM 75 MG PO TBEC: 75.0000 mg | DELAYED_RELEASE_TABLET | Freq: Two times a day (BID) | ORAL | Status: DC | PRN

## 2012-10-06 NOTE — Telephone Encounter (Signed)
RX sent to pharmacy by e-script.//AB/CMA 

## 2012-10-06 NOTE — Telephone Encounter (Signed)
Ok for #60, 1 refill on Diclofenac

## 2012-10-06 NOTE — Telephone Encounter (Signed)
Refill for synthroid sent  To [facility]

## 2012-11-03 ENCOUNTER — Encounter: Payer: Self-pay | Admitting: Family Medicine

## 2012-11-03 ENCOUNTER — Other Ambulatory Visit: Payer: Self-pay | Admitting: Family Medicine

## 2012-11-03 ENCOUNTER — Ambulatory Visit (INDEPENDENT_AMBULATORY_CARE_PROVIDER_SITE_OTHER): Payer: BC Managed Care – PPO | Admitting: Family Medicine

## 2012-11-03 VITALS — BP 138/82 | HR 67 | Temp 98.4°F | Wt 206.0 lb

## 2012-11-03 DIAGNOSIS — J329 Chronic sinusitis, unspecified: Secondary | ICD-10-CM

## 2012-11-03 MED ORDER — AMOXICILLIN 875 MG PO TABS: 875.0000 mg | ORAL_TABLET | Freq: Two times a day (BID) | ORAL | Status: DC

## 2012-11-03 MED ORDER — PROMETHAZINE-DM 6.25-15 MG/5ML PO SYRP
5.0000 mL | ORAL_SOLUTION | Freq: Four times a day (QID) | ORAL | Status: DC | PRN
Start: 2012-11-03 — End: 2013-01-31

## 2012-11-03 MED ORDER — AMOXICILLIN 875 MG PO TABS: 875.0000 mg | ORAL_TABLET | Freq: Two times a day (BID) | ORAL | Status: AC

## 2012-11-03 NOTE — Progress Notes (Signed)
  Subjective:    Patient ID: [name], female    DOB: Sep 19, 1946, 67 y.o.   MRN: [id number]  HPI URI- sxs started Tuesday w/ sinus pressure.  Then developed sneezing, nasal congestion.  Started Coricidin HBP cold and flu w/out relief.  + PND w/ subsequent cough.  Bilateral ear fullness.  No fevers.  No N/V/D.  + sick contacts.  No tooth pain.   Review of Systems For ROS see HPI     Objective:   Physical Exam  Vitals reviewed. Constitutional: She appears well-developed and well-nourished. No distress.  HENT:  Head: Normocephalic and atraumatic.  Right Ear: Tympanic membrane normal.  Left Ear: Tympanic membrane normal.  Nose: Mucosal edema and rhinorrhea present. Right sinus exhibits maxillary sinus tenderness. Right sinus exhibits no frontal sinus tenderness. Left sinus exhibits maxillary sinus tenderness. Left sinus exhibits no frontal sinus tenderness.  Mouth/Throat: Uvula is midline and mucous membranes are normal. Posterior oropharyngeal erythema present. No oropharyngeal exudate.  Eyes: Conjunctivae normal and EOM are normal. Pupils are equal, round, and reactive to light.  Neck: Normal range of motion. Neck supple.  Cardiovascular: Normal rate, regular rhythm and normal heart sounds.   Pulmonary/Chest: Effort normal and breath sounds normal. No respiratory distress. She has no wheezes.  Lymphadenopathy:    She has no cervical adenopathy.          Assessment & Plan:

## 2012-11-03 NOTE — Patient Instructions (Addendum)
This is a sinus infection Start the Amox twice daily- take w/ food Drink plenty of fluids Add Mucinex DM to thin congestion and suppress cough REST! Hang in there!!!

## 2012-11-03 NOTE — Assessment & Plan Note (Signed)
Pt's sxs and PE consistent w/ infxn.  Start abx.  Reviewed supportive care and red flags that should prompt return.  Pt expressed understanding and is in agreement w/ plan.  

## 2012-11-03 NOTE — Addendum Note (Signed)
Addended by: Sheliah Hatch on: 11/03/2012 02:14 PM   Modules accepted: Orders

## 2012-12-11 ENCOUNTER — Telehealth: Payer: Self-pay | Admitting: Family Medicine

## 2012-12-11 NOTE — Telephone Encounter (Signed)
refill Cyclobenzaprine (Tab) 10 MG Take 1 tablet (10 mg total) by mouth every 8 (eight) hours as needed for muscle spasms. #60 last fill 2.1.14

## 2012-12-12 NOTE — Telephone Encounter (Signed)
Please advise on RF request.  Last fill:11-11-12-last CPE:07-05-12.//AB/CMA

## 2012-12-12 NOTE — Telephone Encounter (Signed)
Ok for #60, no refills.  Should not require this all the time

## 2012-12-13 NOTE — Telephone Encounter (Signed)
LM @ (9:01am) asking the pt to RTC.//AB/CMA

## 2012-12-14 MED ORDER — CYCLOBENZAPRINE HCL 10 MG PO TABS: 10.0000 mg | ORAL_TABLET | Freq: Three times a day (TID) | ORAL | Status: DC | PRN

## 2012-12-14 NOTE — Telephone Encounter (Signed)
Called and LM on the pt's work phone(at her request) informing her that the RF request has been approved and sent to her pharmacy([facility]).  Also informed her that Dr. Beverely Low stated that she should not require this all the time.  Informed her if she has any questions to please give [place] a call back.//AB/CMA

## 2012-12-14 NOTE — Telephone Encounter (Signed)
pt called again wants you to leave a detailed message on her work phone as to what you are calling about  [phone]

## 2012-12-14 NOTE — Telephone Encounter (Signed)
pt returned your call and would like for you to call her back at work at [phone] after 9:30am

## 2013-01-31 ENCOUNTER — Ambulatory Visit (INDEPENDENT_AMBULATORY_CARE_PROVIDER_SITE_OTHER): Payer: BC Managed Care – PPO | Admitting: Internal Medicine

## 2013-01-31 VITALS — BP 122/74 | HR 58 | Temp 98.1°F | Wt 207.0 lb

## 2013-01-31 DIAGNOSIS — M129 Arthropathy, unspecified: Secondary | ICD-10-CM

## 2013-01-31 NOTE — Progress Notes (Signed)
  Subjective:    Patient ID: [name], female    DOB: [birth year], 67 y.o.   MRN: [id number]  HPI Acute visit History of knee problems for a while, saw Dr. Juliene Pina last year, diagnosed with a meniscal problem, recommended conservative treatment. At the time the left knee was hurting and her knee got locked from time to time. She took a trip to visit her daughter during the weekend, did a lot of walking, since then the knee is hurting more, it got locked 3 times in the last 24 hours, usually happens when she is trying to stand or sit down on the leg, locked usually in the mid position.  Past Medical History  Diagnosis Date  . Asthma   . Arthritis   . Migraine   . Seasonal allergies   . Hx of blood clots   . Family history of thyroid problem   . UTI (urinary tract infection)   . Hypertension    Past Surgical History  Procedure Laterality Date  . Detatched retina      rt eye  . Cataract extraction      rt eye      Review of Systems Denies calf pain or swelling. Some back pain bilaterally without radiation. No lower extremity paresthesias.     Objective:   Physical Exam  Musculoskeletal:       Legs:   General -- alert, well-developed, No apparent distress .   Extremities--  no pretibial edema bilaterally, Golf symmetric, not tender to palpation. Right knee: DJD deformities, no swelling redness or warmness. Left knee: DJD deformities, range of motion is slightly decreased, did not locked during the exam, There is mild swelling proximal from the knee otherwise no effusion that I can tell. See graphic. Left quadriceps hypotrophic. Neurologic-- alert & oriented X3 and strength normal in all extremities. Psych-- Cognition and judgment appear intact. Alert and cooperative with normal attention span and concentration.  not anxious appearing and not depressed appearing.      Assessment & Plan:

## 2013-01-31 NOTE — Patient Instructions (Addendum)
Get a cane Ice 3 times a day to the L knee We are getting an appointment to see ortho

## 2013-02-01 ENCOUNTER — Encounter: Payer: Self-pay | Admitting: Internal Medicine

## 2013-02-01 NOTE — Assessment & Plan Note (Signed)
DJD, Pain and locking exacerbation on the left knee, she needs to see orthopedic surgery, will arrange. Recommend the use of ice, a cane to prevent falls.

## 2013-02-13 ENCOUNTER — Other Ambulatory Visit: Payer: Self-pay | Admitting: *Deleted

## 2013-02-13 DIAGNOSIS — E079 Disorder of thyroid, unspecified: Secondary | ICD-10-CM

## 2013-02-13 MED ORDER — LEVOTHYROXINE SODIUM 112 MCG PO TABS: ORAL_TABLET | ORAL | Status: DC

## 2013-02-13 MED ORDER — LISINOPRIL 20 MG PO TABS: 20.0000 mg | ORAL_TABLET | Freq: Every day | ORAL | Status: DC

## 2013-02-13 NOTE — Telephone Encounter (Signed)
Rx sent left Pt detail message. 

## 2013-02-16 ENCOUNTER — Ambulatory Visit (INDEPENDENT_AMBULATORY_CARE_PROVIDER_SITE_OTHER)
Admission: RE | Admit: 2013-02-16 | Discharge: 2013-02-16 | Disposition: A | Discharge status: Home / Self Care | Payer: BC Managed Care – PPO | Source: Ambulatory Visit | Attending: Family Medicine | Admitting: Family Medicine

## 2013-02-16 ENCOUNTER — Ambulatory Visit (INDEPENDENT_AMBULATORY_CARE_PROVIDER_SITE_OTHER): Payer: BC Managed Care – PPO | Admitting: Family Medicine

## 2013-02-16 ENCOUNTER — Encounter: Payer: Self-pay | Admitting: Family Medicine

## 2013-02-16 VITALS — BP 120/70 | HR 96 | Temp 98.1°F | Ht 65.5 in | Wt 208.2 lb

## 2013-02-16 DIAGNOSIS — M79609 Pain in unspecified limb: Secondary | ICD-10-CM

## 2013-02-16 DIAGNOSIS — M79672 Pain in left foot: Secondary | ICD-10-CM

## 2013-02-16 DIAGNOSIS — I10 Essential (primary) hypertension: Secondary | ICD-10-CM

## 2013-02-16 DIAGNOSIS — M79673 Pain in unspecified foot: Secondary | ICD-10-CM | POA: Insufficient documentation

## 2013-02-16 LAB — BASIC METABOLIC PANEL
BUN: 16 mg/dL (ref 6–23)
Chloride: 100 mEq/L (ref 96–112)
Glucose, Bld: 84 mg/dL (ref 70–99)
Potassium: 4 mEq/L (ref 3.5–5.1)

## 2013-02-16 MED ORDER — MELOXICAM 15 MG PO TABS: 15.0000 mg | ORAL_TABLET | Freq: Every day | ORAL | Status: DC

## 2013-02-16 NOTE — Progress Notes (Signed)
  Subjective:    Patient ID: [name], female    DOB: Feb 28, 1946, 67 y.o.   MRN: [id number]  HPI L heel pain- painful to bear weight, sxs started 3 days ago.  Started as mild discomfort- felt like a bruise.  Worsened yesterday while on feet.  Has R hip arthritis and is needing to favor the R side but now unable to walk on either R or L side.  Started using walker today for balance and to relieve pressure.  1st steps are not necessarily worse.  HTN- chronic problem, well controlled.  Asymptomatic.  No CP, SOB, HAs, visual changes, edema.   Review of Systems For ROS see HPI     Objective:   Physical Exam  Vitals reviewed. Constitutional: She is oriented to person, place, and time. She appears well-developed and well-nourished. No distress.  HENT:  Head: Normocephalic and atraumatic.  Eyes: Conjunctivae and EOM are normal. Pupils are equal, round, and reactive to light.  Neck: Normal range of motion. Neck supple. No thyromegaly present.  Cardiovascular: Normal rate, regular rhythm, normal heart sounds and intact distal pulses.   No murmur heard. Pulmonary/Chest: Effort normal and breath sounds normal. No respiratory distress.  Abdominal: Soft. She exhibits no distension. There is no tenderness.  Musculoskeletal: She exhibits tenderness (TTP over center of L calcaneous on plantar surface.  no TTP over plantar fascia.  no overlying skin changes). She exhibits no edema.  Lymphadenopathy:    She has no cervical adenopathy.  Neurological: She is alert and oriented to person, place, and time.  Skin: Skin is warm and dry.  Psychiatric: She has a normal mood and affect. Her behavior is normal.          Assessment & Plan:

## 2013-02-16 NOTE — Patient Instructions (Addendum)
Go to [address] and get your xrays Once you complete the prednisone, start the Mobic once daily ICE! Wear good, cushioned shoes w/ insoles We'll call you with your xray results Hang in there!

## 2013-02-18 NOTE — Assessment & Plan Note (Signed)
Chronic problem.  Well controlled.  Asymptomatic.  Check labs.  No anticipated med changes. 

## 2013-02-18 NOTE — Assessment & Plan Note (Addendum)
New.  Not consistent w/ plantar fasciitis.  Get xray to assess for calcaneal spur.  Encouraged ice and cushioned insoles.   Start daily NSAIDs when done w/ prednisone.  Will follow.

## 2013-02-19 ENCOUNTER — Telehealth: Payer: Self-pay | Admitting: *Deleted

## 2013-02-19 NOTE — Telephone Encounter (Signed)
Message copied by Baldwin [place] on Mon Feb 19, 2013  9:14 AM ------      Message from: Sheliah Hatch      Created: Fri Feb 16, 2013  1:48 PM       Small bone spur on heel      Arthritic change of big toe            The ice, anti-inflammatory, and cushioned insoles should help ------

## 2013-02-19 NOTE — Telephone Encounter (Signed)
Spoke with patient, relayed information concerning lab results and X-ray. Advised to take mobic with food, use ice and cushioned insoles even while in the house. Patient verbalized understanding of this.

## 2013-02-20 ENCOUNTER — Other Ambulatory Visit: Payer: Self-pay | Admitting: *Deleted

## 2013-02-20 DIAGNOSIS — E079 Disorder of thyroid, unspecified: Secondary | ICD-10-CM

## 2013-02-20 MED ORDER — LISINOPRIL 20 MG PO TABS: 20.0000 mg | ORAL_TABLET | Freq: Every day | ORAL | Status: DC

## 2013-02-20 MED ORDER — LEVOTHYROXINE SODIUM 112 MCG PO TABS: ORAL_TABLET | ORAL | Status: DC

## 2013-02-20 NOTE — Telephone Encounter (Signed)
Rx sent to the pharmacy by e-script.//AB/CMA 

## 2013-02-21 ENCOUNTER — Telehealth: Payer: Self-pay | Admitting: *Deleted

## 2013-02-21 NOTE — Telephone Encounter (Signed)
Patient called the office and left message on voice mail requesting FMLA paperwork be filled out for foot/ankle pain. Patient states she has missed several days of work and feels her job may be in jeopardy because of this. Patient had OV on 5/9/014 for foot pain and had X-ray that showed a small calcaneal spur & arthritis in the big toe of that foot. Does she need another OV for form completion? Please advise.

## 2013-02-21 NOTE — Telephone Encounter (Signed)
She does not need another OV but she needs to drop off the forms and attach a note w/ specific days/times of missed work

## 2013-02-21 NOTE — Telephone Encounter (Signed)
Ok, I will let her know.

## 2013-02-28 ENCOUNTER — Telehealth: Payer: Self-pay | Admitting: *Deleted

## 2013-02-28 NOTE — Telephone Encounter (Signed)
Attempted to contact pt regarding FMLA forms she dropped off to be completed. Advised by Dr. Beverely Low that if the patient is seeing [facility] for arthritis, then they need to be the ones who complete FMLA paperwork for her. LMOVM for pt to return my call.

## 2013-03-01 ENCOUNTER — Telehealth: Payer: Self-pay | Admitting: *Deleted

## 2013-03-07 NOTE — Telephone Encounter (Signed)
error 

## 2013-03-16 ENCOUNTER — Telehealth: Payer: Self-pay | Admitting: *Deleted

## 2013-03-16 NOTE — Telephone Encounter (Signed)
error 

## 2013-03-22 ENCOUNTER — Other Ambulatory Visit: Payer: Self-pay | Admitting: Family Medicine

## 2013-05-17 ENCOUNTER — Telehealth: Payer: Self-pay | Admitting: Family Medicine

## 2013-05-17 MED ORDER — LEVOTHYROXINE SODIUM 112 MCG PO TABS: 112.0000 ug | ORAL_TABLET | Freq: Every day | ORAL | Status: DC

## 2013-05-17 NOTE — Telephone Encounter (Signed)
Rx sent to the pharmacy by e-script.  Called and LM @ (10:45am) informing the pt that I refilled her meds and gave her enough until she comes in for her CPE.  Also apologized to the pt for not given her enough meds.//AB/CMA

## 2013-05-17 NOTE — Telephone Encounter (Signed)
Patient called stating she needs refills on her levothyroxine. She has cpe scheduled for 07/06/13. Can we give her enough to last until that appointment? Patient called [place] rather than the pharmacy because she was upset that we are not giving her enough refills on this medication.

## 2013-07-05 ENCOUNTER — Telehealth: Payer: Self-pay

## 2013-07-05 NOTE — Telephone Encounter (Signed)
LM for CB  HM Received pneumococcal conjugate 08/27/2011,UTD WE flu vaccine

## 2013-07-06 ENCOUNTER — Ambulatory Visit (INDEPENDENT_AMBULATORY_CARE_PROVIDER_SITE_OTHER): Payer: BC Managed Care – PPO | Admitting: Family Medicine

## 2013-07-06 ENCOUNTER — Encounter: Payer: Self-pay | Admitting: *Deleted

## 2013-07-06 ENCOUNTER — Encounter: Payer: Self-pay | Admitting: Family Medicine

## 2013-07-06 VITALS — BP 124/76 | HR 64 | Temp 97.9°F | Ht 65.5 in | Wt 192.6 lb

## 2013-07-06 DIAGNOSIS — Z01419 Encounter for gynecological examination (general) (routine) without abnormal findings: Secondary | ICD-10-CM

## 2013-07-06 DIAGNOSIS — I10 Essential (primary) hypertension: Secondary | ICD-10-CM

## 2013-07-06 DIAGNOSIS — Z0000 Encounter for general adult medical examination without abnormal findings: Secondary | ICD-10-CM

## 2013-07-06 DIAGNOSIS — E039 Hypothyroidism, unspecified: Secondary | ICD-10-CM

## 2013-07-06 LAB — HEPATIC FUNCTION PANEL
ALT: 14 U/L (ref 0–35)
AST: 21 U/L (ref 0–37)
Bilirubin, Direct: 0.1 mg/dL (ref 0.0–0.3)
Total Bilirubin: 0.6 mg/dL (ref 0.3–1.2)

## 2013-07-06 LAB — BASIC METABOLIC PANEL
Chloride: 110 mEq/L (ref 96–112)
GFR: 79.16 mL/min (ref >60.00)
Potassium: 4.1 mEq/L (ref 3.5–5.1)

## 2013-07-06 LAB — CBC WITH DIFFERENTIAL/PLATELET
Basophils Absolute: 0 10*3/uL (ref 0.0–0.1)
Eosinophils Absolute: 0.2 10*3/uL (ref 0.0–0.7)
Lymphocytes Relative: 21.2 % (ref 12.0–46.0)
MCHC: 34 g/dL (ref 30.0–36.0)
Monocytes Absolute: 0.3 10*3/uL (ref 0.1–1.0)
Neutrophils Relative %: 67.1 % (ref 43.0–77.0)
Platelets: 244 10*3/uL (ref 150.0–400.0)
RDW: 14.7 % — ABNORMAL HIGH (ref 11.5–14.6)

## 2013-07-06 LAB — LIPID PANEL
Cholesterol: 140 mg/dL (ref 0–200)
HDL: 59.9 mg/dL (ref >39.00)
LDL Cholesterol: 61 mg/dL (ref 0–99)
VLDL: 18.8 mg/dL (ref 0.0–40.0)

## 2013-07-06 MED ORDER — THYROID 130 MG PO TABS: 130.0000 mg | ORAL_TABLET | Freq: Every day | ORAL | Status: DC

## 2013-07-06 MED ORDER — MELOXICAM 15 MG PO TABS: 15.0000 mg | ORAL_TABLET | Freq: Every day | ORAL | Status: DC

## 2013-07-06 MED ORDER — LISINOPRIL 20 MG PO TABS: 20.0000 mg | ORAL_TABLET | Freq: Every day | ORAL | Status: DC

## 2013-07-06 MED ORDER — CYCLOBENZAPRINE HCL 10 MG PO TABS: 10.0000 mg | ORAL_TABLET | Freq: Three times a day (TID) | ORAL | Status: DC | PRN

## 2013-07-06 NOTE — Telephone Encounter (Signed)
Unable to reach pre visit.  

## 2013-07-06 NOTE — Progress Notes (Signed)
  Subjective:    Patient ID: [name], female    DOB: 10-09-1946, 67 y.o.   MRN: [id number]  HPI CPE- UTD on colonoscopy, DEXA, pap.  Due for mammo- plans to schedule at [facility].   Review of Systems Patient reports no vision/hearing changes, adenopathy,fever, weight change,  persistant/recurrent hoarseness , swallowing issues, chest pain, palpitations, edema, persistant/recurrent cough, hemoptysis, dyspnea (rest/exertional/paroxysmal nocturnal), gastrointestinal bleeding (melena, rectal bleeding), abdominal pain, significant heartburn, bowel changes, GU symptoms (dysuria, hematuria, incontinence), Gyn symptoms (abnormal  bleeding, pain),  syncope, focal weakness, memory loss, numbness & tingling, skin/hair/nail changes, abnormal bruising or bleeding, anxiety, or depression.     Objective:   Physical Exam General Appearance:    Alert, cooperative, no distress, appears stated age  Head:    Normocephalic, without obvious abnormality, atraumatic  Eyes:    PERRL, conjunctiva/corneas clear, EOM's intact, fundi    benign, both eyes  Ears:    Normal TM's and external ear canals, both ears  Nose:   Nares normal, septum midline, mucosa normal, no drainage    or sinus tenderness  Throat:   Lips, mucosa, and tongue normal; teeth and gums normal  Neck:   Supple, symmetrical, trachea midline, no adenopathy;    Thyroid: no enlargement/tenderness/nodules  Back:     Symmetric, no curvature, ROM normal, no CVA tenderness  Lungs:     Clear to auscultation bilaterally, respirations unlabored  Chest Wall:    No tenderness or deformity   Heart:    Regular rate and rhythm, S1 and S2 normal, no murmur, rub   or gallop  Breast Exam:    Deferred to mammo at pt's request  Abdomen:     Soft, non-tender, bowel sounds active all four quadrants,    no masses, no organomegaly  Genitalia:    Deferred  Rectal:    Extremities:   Extremities normal, atraumatic, no cyanosis or edema  Pulses:   2+ and symmetric all  extremities  Skin:   Skin color, texture, turgor normal, no rashes or lesions  Lymph nodes:   Cervical, supraclavicular, and axillary nodes normal  Neurologic:   CNII-XII intact, normal strength, sensation and reflexes    throughout          Assessment & Plan:

## 2013-07-06 NOTE — Patient Instructions (Addendum)
Follow up in 6 months to recheck BP We'll notify you of your lab results and make any changes if needed Keep up the good work!  You look great! Schedule your mammo at [facility] Call with any questions or concerns Happy Fall!!!

## 2013-07-08 NOTE — Assessment & Plan Note (Signed)
Pt's PE WNL.  UTD on all health maintenance w/ exception of mammo- pt encouraged to schedule.  Check labs.  Anticipatory guidance provided.

## 2013-07-10 ENCOUNTER — Encounter: Payer: Self-pay | Admitting: *Deleted

## 2013-07-10 LAB — VITAMIN D 1,25 DIHYDROXY
Vitamin D 1, 25 (OH)2 Total: 65 pg/mL (ref 18–72)
Vitamin D2 1, 25 (OH)2: <8 pg/mL
Vitamin D3 1, 25 (OH)2: 65 pg/mL

## 2013-08-16 ENCOUNTER — Telehealth: Payer: Self-pay | Admitting: Family Medicine

## 2013-08-16 NOTE — Telephone Encounter (Signed)
Patient states she would like [place] to document that she went to a pharmacy and received her high dose flu shot on 08/04/13.

## 2013-08-16 NOTE — Telephone Encounter (Signed)
Chart has been updated.

## 2013-09-07 ENCOUNTER — Encounter (HOSPITAL_COMMUNITY): Payer: Self-pay | Admitting: Pharmacy Technician

## 2013-09-11 ENCOUNTER — Ambulatory Visit (HOSPITAL_COMMUNITY)
Admission: RE | Admit: 2013-09-11 | Discharge: 2013-09-11 | Disposition: A | Discharge status: Home / Self Care | Payer: BC Managed Care – PPO | Source: Ambulatory Visit | Attending: Surgical | Admitting: Surgical

## 2013-09-11 ENCOUNTER — Encounter (HOSPITAL_COMMUNITY): Payer: Self-pay

## 2013-09-11 ENCOUNTER — Encounter (HOSPITAL_COMMUNITY)
Admission: RE | Admit: 2013-09-11 | Discharge: 2013-09-11 | Disposition: A | Discharge status: Home / Self Care | Payer: BC Managed Care – PPO | Source: Ambulatory Visit | Attending: Orthopedic Surgery | Admitting: Orthopedic Surgery

## 2013-09-11 ENCOUNTER — Encounter (INDEPENDENT_AMBULATORY_CARE_PROVIDER_SITE_OTHER): Payer: Self-pay

## 2013-09-11 ENCOUNTER — Encounter: Payer: Self-pay | Admitting: General Practice

## 2013-09-11 DIAGNOSIS — M169 Osteoarthritis of hip, unspecified: Secondary | ICD-10-CM | POA: Insufficient documentation

## 2013-09-11 DIAGNOSIS — M1610 Unilateral primary osteoarthritis, unspecified hip: Secondary | ICD-10-CM | POA: Insufficient documentation

## 2013-09-11 DIAGNOSIS — Z01818 Encounter for other preprocedural examination: Secondary | ICD-10-CM | POA: Insufficient documentation

## 2013-09-11 DIAGNOSIS — Z01812 Encounter for preprocedural laboratory examination: Secondary | ICD-10-CM | POA: Insufficient documentation

## 2013-09-11 DIAGNOSIS — Z01810 Encounter for preprocedural cardiovascular examination: Secondary | ICD-10-CM | POA: Insufficient documentation

## 2013-09-11 HISTORY — DX: Hypoglycemia, unspecified: E16.2

## 2013-09-11 HISTORY — DX: Hypothyroidism, unspecified: E03.9

## 2013-09-11 HISTORY — DX: Pneumonia, unspecified organism: J18.9

## 2013-09-11 HISTORY — DX: Sleep apnea, unspecified: G47.30

## 2013-09-11 HISTORY — DX: Personal history of other diseases of the respiratory system: Z87.09

## 2013-09-11 LAB — CBC
HCT: 36.7 % (ref 36.0–46.0)
MCH: 30.4 pg (ref 26.0–34.0)
MCV: 90.6 fL (ref 78.0–100.0)
Platelets: 241 10*3/uL (ref 150–400)
RDW: 13.7 % (ref 11.5–15.5)
WBC: 5.1 10*3/uL (ref 4.0–10.5)

## 2013-09-11 LAB — PROTIME-INR
INR: 1 (ref 0.00–1.49)
Prothrombin Time: 13 seconds (ref 11.6–15.2)

## 2013-09-11 LAB — COMPREHENSIVE METABOLIC PANEL
ALT: 14 U/L (ref 0–35)
AST: 21 U/L (ref 0–37)
Albumin: 3.7 g/dL (ref 3.5–5.2)
Alkaline Phosphatase: 71 U/L (ref 39–117)
BUN: 14 mg/dL (ref 6–23)
CO2: 27 mEq/L (ref 19–32)
Calcium: 9.8 mg/dL (ref 8.4–10.5)
Chloride: 103 mEq/L (ref 96–112)
Creatinine, Ser: 0.7 mg/dL (ref 0.50–1.10)
GFR calc Af Amer: >90 mL/min (ref >90)
GFR calc non Af Amer: 87 mL/min — ABNORMAL LOW (ref >90)
Glucose, Bld: 91 mg/dL (ref 70–99)
Potassium: 4 mEq/L (ref 3.5–5.1)
Sodium: 138 mEq/L (ref 135–145)
Total Bilirubin: 0.3 mg/dL (ref 0.3–1.2)
Total Protein: 7.1 g/dL (ref 6.0–8.3)

## 2013-09-11 LAB — URINALYSIS, ROUTINE W REFLEX MICROSCOPIC
Bilirubin Urine: NEGATIVE
Glucose, UA: NEGATIVE mg/dL
Hgb urine dipstick: NEGATIVE
Ketones, ur: NEGATIVE mg/dL
Nitrite: NEGATIVE
Protein, ur: NEGATIVE mg/dL
Specific Gravity, Urine: 1.016 (ref 1.005–1.030)
Urobilinogen, UA: 0.2 mg/dL (ref 0.0–1.0)
pH: 6 (ref 5.0–8.0)

## 2013-09-11 LAB — APTT: aPTT: 33 seconds (ref 24–37)

## 2013-09-11 LAB — ABO/RH: ABO/RH(D): A POS

## 2013-09-11 LAB — URINE MICROSCOPIC-ADD ON

## 2013-09-11 NOTE — Patient Instructions (Addendum)
20 Nelle Sayed  09/11/2013   Your procedure is scheduled on: 09/19/13  Report to [facility] at 12:00 PM.  Call this number if you have problems the morning of surgery [phone]: [phone]   Remember: please bring inhaler and CPAP mask and tubing on day of surgery   Do not eat food After Midnight, clear liquids from midnight until 8:30am on 09/19/13 then nothing.      Take these medicines the morning of surgery with A SIP OF WATER: albuterol, symbicort, allegra, ocean nasal spray   Do not wear jewelry, make-up or nail polish.  Do not wear lotions, powders, or perfumes. You may wear deodorant.  Do not shave 48 hours prior to surgery. Men may shave face and neck.  Do not bring valuables to the hospital.  Contacts, dentures or bridgework may not be worn into surgery.  Leave suitcase in the car. After surgery it may be brought to your room.  For patients admitted to the hospital, checkout time is 11:00 AM the day of discharge.    Please read over the following fact sheets that you were given: MRSA Information, blood fact sheet, incentive spirometry fact sheet, clear liquids fact sheet.  Birdie Sons, RN  pre op nurse call if needed [phone]    FAILURE TO FOLLOW THESE INSTRUCTIONS MAY RESULT IN CANCELLATION OF YOUR SURGERY   Patient Signature: ___________________________________________

## 2013-09-18 NOTE — H&P (Signed)
TOTAL HIP ADMISSION H&P  Patient is admitted for right total hip arthroplasty.  Subjective:  Chief Complaint: right hip pain  HPI: [name], 67 y.o. female, has a history of pain and functional disability in the right hip(s) due to arthritis and patient has failed non-surgical conservative treatments for greater than 12 weeks to include NSAID's and/or analgesics, use of assistive devices and activity modification.  Onset of symptoms was gradual starting 2 years ago with gradually worsening course since that time.The patient noted no past surgery on the right hip(s).  Patient currently rates pain in the right hip at 5 out of 10 with activity. Patient has night pain, worsening of pain with activity and weight bearing, pain that interfers with activities of daily living, pain with passive range of motion and crepitus. Patient has evidence of periarticular osteophytes and joint space narrowing by imaging studies. This condition presents safety issues increasing the risk of falls. There is no current active infection.  Patient Active Problem List   Diagnosis Date Noted  . Heel pain 02/16/2013  . Viral URI 08/26/2012  . Knee pain 07/05/2012  . Bilateral bunions 07/05/2012  . Screening for malignant neoplasm of the cervix 07/05/2012  . Routine gynecological examination 07/05/2012  . Sinusitis 02/18/2012  . Anxiety and depression 11/02/2011  . Sleep apnea 11/02/2011  . ASTHMA, INTERMITTENT 08/12/2010  . WHEEZING 12/30/2009  . PANIC DISORDER 05/07/2009  . HYPERTENSION, BENIGN 12/05/2008  . HYPOTHYROIDISM 03/27/2008  . ARTHRITIS 03/27/2008  . Allergic Rhinitis, Cause Unspecified 08/26/2007   Past Medical History  Diagnosis Date  . Arthritis   . Seasonal allergies   . Hx of blood clots 1967    from birth control in LUE  . UTI (urinary tract infection)   . Hypertension   . Hypothyroidism   . Asthma     mild  . H/O bronchitis   . Pneumonia     hx of  . Hypoglycemia   .  Migraine     from food  . Sleep apnea     Past Surgical History  Procedure Laterality Date  . Detatched retina  2003    rt eye  . Cataract extraction      rt eye  . Colonoscopy       Current outpatient prescriptions: albuterol (PROVENTIL HFA;VENTOLIN HFA) 108 (90 BASE) MCG/ACT inhaler, Inhale 2 puffs into the lungs every 4 (four) hours as needed for wheezing or shortness of breath., Disp: , Rfl: aspirin 81 MG tablet, Take 81 mg by mouth daily. , Disp: , Rfl: ;   celecoxib (CELEBREX) 200 MG capsule, Take 200 mg by mouth daily., Disp: , Rfl: ;   fexofenadine (ALLEGRA) 60 MG tablet, Take 60 mg by mouth daily., Disp: , Rfl:  lisinopril (PRINIVIL,ZESTRIL) 20 MG tablet, Take 20 mg by mouth at bedtime., Disp: , Rfl: ;   Multiple Vitamin (MULTIVITAMIN) capsule, Take 1 capsule by mouth daily., Disp: , Rfl: ;   sodium chloride (OCEAN) 0.65 % nasal spray, Place 1 spray into the nose 2 (two) times daily. , Disp: , Rfl: ;   thyroid (ARMOUR) 130 MG tablet, Take 130 mg by mouth at bedtime., Disp: , Rfl:  acetaminophen (TYLENOL) 500 MG tablet, Take 500 mg by mouth as needed for mild pain or moderate pain. , Disp: , Rfl: ;   budesonide-formoterol (SYMBICORT) 160-4.5 MCG/ACT inhaler, Inhale 2 puffs into the lungs 2 (two) times daily as needed (shortness of breath). , Disp: , Rfl: ;  cyclobenzaprine (FLEXERIL) 10 MG tablet, Take 10 mg by mouth every 8 (eight) hours as needed for muscle spasms., Disp: , Rfl:   Allergies  Allergen Reactions  . Codeine   . Prednisone Other (See Comments)    Causes more pain    History  Substance Use Topics  . Smoking status: Former Smoker -- 3.00 packs/day for 13 years    Types: Cigarettes    Quit date: 10/11/1974  . Smokeless tobacco: Never Used  . Alcohol Use: No    Family History  Problem Relation Age of Onset  . Rheum arthritis Mother   . Rheum arthritis Father   . Rheum arthritis Maternal Grandmother   . Rheum arthritis Maternal Grandfather   . Rheum  arthritis Paternal Grandmother      Review of Systems  Constitutional: Negative.   HENT: Negative.   Eyes: Negative.   Respiratory: Negative.   Cardiovascular: Negative.   Gastrointestinal: Negative.   Genitourinary: Positive for frequency. Negative for dysuria, urgency, hematuria and flank pain.  Musculoskeletal: Positive for back pain and joint pain. Negative for falls, myalgias and neck pain.  Skin: Negative.   Neurological: Negative.   Endo/Heme/Allergies: Negative.   Psychiatric/Behavioral: Negative.     Objective:  Physical Exam  Constitutional: She is oriented to person, place, and time. She appears well-developed and well-nourished. No distress.  HENT:  Head: Normocephalic and atraumatic.  Right Ear: External ear normal.  Left Ear: External ear normal.  Nose: Nose normal.  Mouth/Throat: Oropharynx is clear and moist.  Eyes: Conjunctivae and EOM are normal.  Neck: Normal range of motion.  Cardiovascular: Normal rate, regular rhythm, normal heart sounds and intact distal pulses.   No murmur heard. Respiratory: Effort normal and breath sounds normal. No respiratory distress. She has no wheezes.  GI: Soft. Bowel sounds are normal. She exhibits no distension. There is no tenderness.  Musculoskeletal:       Right hip: She exhibits decreased range of motion, decreased strength and crepitus.       Left hip: Normal.       Right knee: Normal.       Left knee: Normal.       Right lower leg: She exhibits no tenderness and no swelling.       Left lower leg: She exhibits no tenderness and no swelling.  Neurological: She is alert and oriented to person, place, and time. She has normal strength and normal reflexes. No sensory deficit.  Skin: No rash noted. She is not diaphoretic. No erythema.  Psychiatric: She has a normal mood and affect. Her behavior is normal.    Vitals Weight: 200.25 lb Height: 65 in Body Surface Area: 2.04 m Body Mass Index: 33.32 kg/m Pulse: 66  (Regular) BP: 150/74 (Sitting, Left Arm, Standard)  Imaging Review Plain radiographs demonstrate severe degenerative joint disease of the right hip(s). The bone quality appears to be fair for age and reported activity level.  Assessment/Plan:  End stage arthritis, right hip(s)  The patient history, physical examination, clinical judgement of the provider and imaging studies are consistent with end stage degenerative joint disease of the right hip(s) and total hip arthroplasty is deemed medically necessary. The treatment options including medical management, injection therapy, arthroscopy and arthroplasty were discussed at length. The risks and benefits of total hip arthroplasty were presented and reviewed. The risks due to aseptic loosening, infection, stiffness, dislocation/subluxation,  thromboembolic complications and other imponderables were discussed.  The patient acknowledged the explanation, agreed to proceed with the  plan and consent was signed. Patient is being admitted for inpatient treatment for surgery, pain control, PT, OT, prophylactic antibiotics, VTE prophylaxis, progressive ambulation and ADL's and discharge planning.The patient is planning to be discharged to skilled nursing facility    [place], [place]

## 2013-09-19 ENCOUNTER — Encounter (HOSPITAL_COMMUNITY): Payer: Self-pay

## 2013-09-19 ENCOUNTER — Inpatient Hospital Stay (HOSPITAL_COMMUNITY): Payer: BC Managed Care – PPO | Admitting: Anesthesiology

## 2013-09-19 ENCOUNTER — Inpatient Hospital Stay (HOSPITAL_COMMUNITY)
Admission: RE | Admit: 2013-09-19 | Discharge: 2013-09-23 | DRG: 470 | Disposition: A | Discharge status: Skilled Nursing Facility | Payer: BC Managed Care – PPO | Source: Ambulatory Visit | Attending: Orthopedic Surgery | Admitting: Orthopedic Surgery

## 2013-09-19 ENCOUNTER — Inpatient Hospital Stay (HOSPITAL_COMMUNITY): Payer: BC Managed Care – PPO

## 2013-09-19 ENCOUNTER — Encounter (HOSPITAL_COMMUNITY)
Admission: RE | Disposition: A | Discharge status: Skilled Nursing Facility | Payer: Self-pay | Source: Ambulatory Visit | Attending: Orthopedic Surgery

## 2013-09-19 ENCOUNTER — Encounter (HOSPITAL_COMMUNITY): Payer: BC Managed Care – PPO | Admitting: Anesthesiology

## 2013-09-19 DIAGNOSIS — D62 Acute posthemorrhagic anemia: Secondary | ICD-10-CM | POA: Diagnosis not present

## 2013-09-19 DIAGNOSIS — E669 Obesity, unspecified: Secondary | ICD-10-CM | POA: Diagnosis present

## 2013-09-19 DIAGNOSIS — E039 Hypothyroidism, unspecified: Secondary | ICD-10-CM | POA: Diagnosis present

## 2013-09-19 DIAGNOSIS — J45909 Unspecified asthma, uncomplicated: Secondary | ICD-10-CM | POA: Diagnosis present

## 2013-09-19 DIAGNOSIS — F410 Panic disorder [episodic paroxysmal anxiety] without agoraphobia: Secondary | ICD-10-CM | POA: Diagnosis present

## 2013-09-19 DIAGNOSIS — M169 Osteoarthritis of hip, unspecified: Principal | ICD-10-CM | POA: Diagnosis present

## 2013-09-19 DIAGNOSIS — Z87891 Personal history of nicotine dependence: Secondary | ICD-10-CM

## 2013-09-19 DIAGNOSIS — Z6830 Body mass index (BMI) 30.0-30.9, adult: Secondary | ICD-10-CM

## 2013-09-19 DIAGNOSIS — I10 Essential (primary) hypertension: Secondary | ICD-10-CM | POA: Diagnosis present

## 2013-09-19 DIAGNOSIS — M1611 Unilateral primary osteoarthritis, right hip: Secondary | ICD-10-CM | POA: Diagnosis present

## 2013-09-19 DIAGNOSIS — Z79899 Other long term (current) drug therapy: Secondary | ICD-10-CM

## 2013-09-19 DIAGNOSIS — Z96649 Presence of unspecified artificial hip joint: Secondary | ICD-10-CM

## 2013-09-19 DIAGNOSIS — M1610 Unilateral primary osteoarthritis, unspecified hip: Principal | ICD-10-CM | POA: Diagnosis present

## 2013-09-19 DIAGNOSIS — G4730 Sleep apnea, unspecified: Secondary | ICD-10-CM | POA: Diagnosis present

## 2013-09-19 HISTORY — PX: TOTAL HIP ARTHROPLASTY: SHX124

## 2013-09-19 LAB — PREPARE RBC (CROSSMATCH)

## 2013-09-19 LAB — HEMOGLOBIN AND HEMATOCRIT, BLOOD: Hemoglobin: 11.6 g/dL — ABNORMAL LOW (ref 12.0–15.0)

## 2013-09-19 LAB — GLUCOSE, CAPILLARY: Glucose-Capillary: 183 mg/dL — ABNORMAL HIGH (ref 70–99)

## 2013-09-19 SURGERY — ARTHROPLASTY, HIP, TOTAL,POSTERIOR APPROACH
Anesthesia: General | Site: Hip | Laterality: Right

## 2013-09-19 MED ORDER — CEFAZOLIN SODIUM-DEXTROSE 2-3 GM-% IV SOLR
INTRAVENOUS | Status: AC
  Filled 2013-09-19: qty 50

## 2013-09-19 MED ORDER — LACTATED RINGERS IV SOLN: INTRAVENOUS | Status: DC

## 2013-09-19 MED ORDER — PROPOFOL 10 MG/ML IV BOLUS
INTRAVENOUS | Status: DC | PRN
  Administered 2013-09-19: 150 mg via INTRAVENOUS
  Administered 2013-09-19 (×2): 50 mg via INTRAVENOUS

## 2013-09-19 MED ORDER — CELECOXIB 200 MG PO CAPS
200.0000 mg | ORAL_CAPSULE | Freq: Every day | ORAL | Status: DC
  Filled 2013-09-19: qty 1

## 2013-09-19 MED ORDER — POLYETHYLENE GLYCOL 3350 17 G PO PACK
17.0000 g | PACK | Freq: Every day | ORAL | Status: DC | PRN
  Administered 2013-09-22: 17 g via ORAL

## 2013-09-19 MED ORDER — LIDOCAINE HCL (PF) 2 % IJ SOLN
INTRAMUSCULAR | Status: DC | PRN
  Administered 2013-09-19: 75 mg via INTRADERMAL

## 2013-09-19 MED ORDER — CEFAZOLIN SODIUM-DEXTROSE 2-3 GM-% IV SOLR
2.0000 g | INTRAVENOUS | Status: AC
  Administered 2013-09-19: 2 g via INTRAVENOUS

## 2013-09-19 MED ORDER — LACTATED RINGERS IV SOLN
INTRAVENOUS | Status: DC
  Administered 2013-09-19 (×3): via INTRAVENOUS

## 2013-09-19 MED ORDER — NEOSTIGMINE METHYLSULFATE 1 MG/ML IJ SOLN
INTRAMUSCULAR | Status: DC | PRN
  Administered 2013-09-19: 3 mg via INTRAVENOUS

## 2013-09-19 MED ORDER — HYDROMORPHONE HCL PF 1 MG/ML IJ SOLN
INTRAMUSCULAR | Status: AC
  Filled 2013-09-19: qty 1

## 2013-09-19 MED ORDER — LACTATED RINGERS IV SOLN
INTRAVENOUS | Status: DC
  Administered 2013-09-20: 06:00:00 via INTRAVENOUS

## 2013-09-19 MED ORDER — HYDROMORPHONE HCL 2 MG PO TABS
2.0000 mg | ORAL_TABLET | ORAL | Status: DC | PRN
  Administered 2013-09-19: 2 mg via ORAL
  Filled 2013-09-19: qty 1

## 2013-09-19 MED ORDER — SODIUM CHLORIDE 0.9 % IR SOLN
Status: DC | PRN
  Administered 2013-09-19: 15:00:00

## 2013-09-19 MED ORDER — SODIUM CHLORIDE 0.9 % IJ SOLN
INTRAMUSCULAR | Status: AC
  Filled 2013-09-19: qty 50

## 2013-09-19 MED ORDER — FENTANYL CITRATE 0.05 MG/ML IJ SOLN
INTRAMUSCULAR | Status: AC
  Filled 2013-09-19: qty 5

## 2013-09-19 MED ORDER — BISACODYL 10 MG RE SUPP: 10.0000 mg | Freq: Every day | RECTAL | Status: DC | PRN

## 2013-09-19 MED ORDER — FLEET ENEMA 7-19 GM/118ML RE ENEM: 1.0000 | ENEMA | Freq: Once | RECTAL | Status: AC | PRN

## 2013-09-19 MED ORDER — FERROUS SULFATE 325 (65 FE) MG PO TABS
325.0000 mg | ORAL_TABLET | Freq: Three times a day (TID) | ORAL | Status: DC
  Administered 2013-09-20 – 2013-09-23 (×10): 325 mg via ORAL
  Filled 2013-09-19 (×13): qty 1

## 2013-09-19 MED ORDER — CELECOXIB 200 MG PO CAPS
200.0000 mg | ORAL_CAPSULE | Freq: Two times a day (BID) | ORAL | Status: DC
  Administered 2013-09-20 – 2013-09-23 (×8): 200 mg via ORAL
  Filled 2013-09-19 (×9): qty 1

## 2013-09-19 MED ORDER — GLYCOPYRROLATE 0.2 MG/ML IJ SOLN
INTRAMUSCULAR | Status: AC
  Filled 2013-09-19: qty 2

## 2013-09-19 MED ORDER — MIDAZOLAM HCL 2 MG/2ML IJ SOLN
INTRAMUSCULAR | Status: AC
  Filled 2013-09-19: qty 2

## 2013-09-19 MED ORDER — THROMBIN 5000 UNITS EX SOLR
CUTANEOUS | Status: AC
  Filled 2013-09-19: qty 5000

## 2013-09-19 MED ORDER — BUDESONIDE-FORMOTEROL FUMARATE 160-4.5 MCG/ACT IN AERO
2.0000 | INHALATION_SPRAY | Freq: Two times a day (BID) | RESPIRATORY_TRACT | Status: DC | PRN
  Filled 2013-09-19: qty 6

## 2013-09-19 MED ORDER — HYDROMORPHONE HCL PF 1 MG/ML IJ SOLN
1.0000 mg | INTRAMUSCULAR | Status: DC | PRN
  Administered 2013-09-19: 1 mg via INTRAVENOUS
  Filled 2013-09-19: qty 1

## 2013-09-19 MED ORDER — THROMBIN 5000 UNITS EX SOLR
CUTANEOUS | Status: DC | PRN
  Administered 2013-09-19: 5000 [IU] via TOPICAL

## 2013-09-19 MED ORDER — ROCURONIUM BROMIDE 100 MG/10ML IV SOLN
INTRAVENOUS | Status: DC | PRN
  Administered 2013-09-19: 50 mg via INTRAVENOUS

## 2013-09-19 MED ORDER — BUPIVACAINE LIPOSOME 1.3 % IJ SUSP
20.0000 mL | Freq: Once | INTRAMUSCULAR | Status: DC
  Filled 2013-09-19: qty 20

## 2013-09-19 MED ORDER — PHENOL 1.4 % MT LIQD
1.0000 | OROMUCOSAL | Status: DC | PRN
  Filled 2013-09-19: qty 177

## 2013-09-19 MED ORDER — METHOCARBAMOL 100 MG/ML IJ SOLN
500.0000 mg | Freq: Four times a day (QID) | INTRAVENOUS | Status: DC | PRN
  Filled 2013-09-19: qty 5

## 2013-09-19 MED ORDER — ONDANSETRON HCL 4 MG PO TABS: 4.0000 mg | ORAL_TABLET | Freq: Four times a day (QID) | ORAL | Status: DC | PRN

## 2013-09-19 MED ORDER — FENTANYL CITRATE 0.05 MG/ML IJ SOLN
INTRAMUSCULAR | Status: DC | PRN
  Administered 2013-09-19 (×2): 50 ug via INTRAVENOUS
  Administered 2013-09-19 (×2): 100 ug via INTRAVENOUS
  Administered 2013-09-19 (×4): 50 ug via INTRAVENOUS

## 2013-09-19 MED ORDER — ONDANSETRON HCL 4 MG/2ML IJ SOLN
4.0000 mg | Freq: Four times a day (QID) | INTRAMUSCULAR | Status: DC | PRN
  Administered 2013-09-20: 4 mg via INTRAVENOUS
  Filled 2013-09-19 (×2): qty 2

## 2013-09-19 MED ORDER — HYDROMORPHONE HCL PF 1 MG/ML IJ SOLN
0.2500 mg | INTRAMUSCULAR | Status: DC | PRN
Start: 2013-09-19 — End: 2013-09-19
  Administered 2013-09-19 (×2): 0.5 mg via INTRAVENOUS

## 2013-09-19 MED ORDER — LISINOPRIL 20 MG PO TABS
20.0000 mg | ORAL_TABLET | Freq: Every day | ORAL | Status: DC
  Administered 2013-09-20 – 2013-09-22 (×4): 20 mg via ORAL
  Filled 2013-09-19 (×5): qty 1

## 2013-09-19 MED ORDER — ROCURONIUM BROMIDE 100 MG/10ML IV SOLN
INTRAVENOUS | Status: AC
  Filled 2013-09-19: qty 1

## 2013-09-19 MED ORDER — SODIUM CHLORIDE 0.9 % IJ SOLN
INTRAMUSCULAR | Status: DC | PRN
  Administered 2013-09-19: 17:00:00

## 2013-09-19 MED ORDER — GLYCOPYRROLATE 0.2 MG/ML IJ SOLN
INTRAMUSCULAR | Status: DC | PRN
  Administered 2013-09-19: 0.4 mg via INTRAVENOUS

## 2013-09-19 MED ORDER — CEFAZOLIN SODIUM 1-5 GM-% IV SOLN
1.0000 g | Freq: Four times a day (QID) | INTRAVENOUS | Status: AC
  Administered 2013-09-19 – 2013-09-20 (×2): 1 g via INTRAVENOUS
  Filled 2013-09-19 (×3): qty 50

## 2013-09-19 MED ORDER — ACETAMINOPHEN 650 MG RE SUPP: 650.0000 mg | Freq: Four times a day (QID) | RECTAL | Status: DC | PRN

## 2013-09-19 MED ORDER — LORATADINE 10 MG PO TABS
10.0000 mg | ORAL_TABLET | Freq: Every day | ORAL | Status: DC
  Administered 2013-09-20 – 2013-09-23 (×4): 10 mg via ORAL
  Filled 2013-09-19 (×4): qty 1

## 2013-09-19 MED ORDER — METHOCARBAMOL 500 MG PO TABS
500.0000 mg | ORAL_TABLET | Freq: Four times a day (QID) | ORAL | Status: DC | PRN
  Administered 2013-09-20 – 2013-09-23 (×4): 500 mg via ORAL
  Filled 2013-09-19 (×5): qty 1

## 2013-09-19 MED ORDER — SODIUM CHLORIDE 0.9 % IV SOLN
INTRAVENOUS | Status: DC | PRN
  Administered 2013-09-19: 16:00:00 via INTRAVENOUS

## 2013-09-19 MED ORDER — ACETAMINOPHEN 325 MG PO TABS
650.0000 mg | ORAL_TABLET | Freq: Four times a day (QID) | ORAL | Status: DC | PRN
  Administered 2013-09-20 – 2013-09-23 (×10): 650 mg via ORAL
  Filled 2013-09-19 (×10): qty 2

## 2013-09-19 MED ORDER — RIVAROXABAN 10 MG PO TABS
10.0000 mg | ORAL_TABLET | Freq: Every day | ORAL | Status: DC
  Administered 2013-09-20 – 2013-09-23 (×4): 10 mg via ORAL
  Filled 2013-09-19 (×5): qty 1

## 2013-09-19 MED ORDER — MENTHOL 3 MG MT LOZG
1.0000 | LOZENGE | OROMUCOSAL | Status: DC | PRN
  Filled 2013-09-19: qty 9

## 2013-09-19 MED ORDER — ONDANSETRON HCL 4 MG/2ML IJ SOLN
INTRAMUSCULAR | Status: DC | PRN
  Administered 2013-09-19: 4 mg via INTRAVENOUS

## 2013-09-19 MED ORDER — GLYCOPYRROLATE 0.2 MG/ML IJ SOLN
0.1000 mg | Freq: Once | INTRAMUSCULAR | Status: AC
  Administered 2013-09-19: 0.1 mg via INTRAVENOUS

## 2013-09-19 MED ORDER — ALUM & MAG HYDROXIDE-SIMETH 200-200-20 MG/5ML PO SUSP: 30.0000 mL | ORAL | Status: DC | PRN

## 2013-09-19 MED ORDER — NEOSTIGMINE METHYLSULFATE 1 MG/ML IJ SOLN
INTRAMUSCULAR | Status: AC
  Filled 2013-09-19: qty 10

## 2013-09-19 MED ORDER — PROPOFOL 10 MG/ML IV BOLUS
INTRAVENOUS | Status: AC
  Filled 2013-09-19: qty 20

## 2013-09-19 MED ORDER — GLYCOPYRROLATE 0.2 MG/ML IJ SOLN
INTRAMUSCULAR | Status: AC
  Filled 2013-09-19: qty 1

## 2013-09-19 MED ORDER — LIDOCAINE HCL (CARDIAC) 20 MG/ML IV SOLN
INTRAVENOUS | Status: AC
  Filled 2013-09-19: qty 5

## 2013-09-19 MED ORDER — EPINEPHRINE HCL 1 MG/ML IJ SOLN
INTRAMUSCULAR | Status: AC
  Filled 2013-09-19: qty 1

## 2013-09-19 MED ORDER — EPINEPHRINE HCL 1 MG/ML IJ SOLN
INTRAMUSCULAR | Status: DC | PRN
  Administered 2013-09-19: 1 mg

## 2013-09-19 MED ORDER — ALBUTEROL SULFATE HFA 108 (90 BASE) MCG/ACT IN AERS
2.0000 | INHALATION_SPRAY | RESPIRATORY_TRACT | Status: DC | PRN
  Filled 2013-09-19: qty 6.7

## 2013-09-19 MED ORDER — MIDAZOLAM HCL 5 MG/5ML IJ SOLN
INTRAMUSCULAR | Status: DC | PRN
  Administered 2013-09-19: 2 mg via INTRAVENOUS

## 2013-09-19 MED ORDER — THYROID 30 MG PO TABS
130.0000 mg | ORAL_TABLET | Freq: Every day | ORAL | Status: DC
  Administered 2013-09-20 – 2013-09-22 (×4): 135 mg via ORAL
  Filled 2013-09-19 (×5): qty 1

## 2013-09-19 SURGICAL SUPPLY — 66 items
ADH SKN CLS APL DERMABOND .7 (GAUZE/BANDAGES/DRESSINGS) ×1
BAG SPEC THK2 15X12 ZIP CLS (MISCELLANEOUS) ×1
BAG ZIPLOCK 12X15 (MISCELLANEOUS) ×2 IMPLANT
BLADE SAW SAG 73X25 THK (BLADE) ×1
BLADE SAW SGTL 73X25 THK (BLADE) ×1 IMPLANT
CAPT HIP PF COP ×1 IMPLANT
DERMABOND ADVANCED (GAUZE/BANDAGES/DRESSINGS) ×1
DERMABOND ADVANCED .7 DNX12 (GAUZE/BANDAGES/DRESSINGS) IMPLANT
DRAPE INCISE IOBAN 66X45 STRL (DRAPES) ×1 IMPLANT
DRAPE INCISE IOBAN 85X60 (DRAPES) ×2 IMPLANT
DRAPE ORTHO SPLIT 77X108 STRL (DRAPES) ×4
DRAPE POUCH INSTRU U-SHP 10X18 (DRAPES) ×2 IMPLANT
DRAPE SURG 17X11 SM STRL (DRAPES) ×2 IMPLANT
DRAPE SURG ORHT 6 SPLT 77X108 (DRAPES) ×2 IMPLANT
DRAPE U-SHAPE 47X51 STRL (DRAPES) ×2 IMPLANT
DRSG ADAPTIC 3X8 NADH LF (GAUZE/BANDAGES/DRESSINGS) IMPLANT
DRSG AQUACEL AG ADV 3.5X10 (GAUZE/BANDAGES/DRESSINGS) IMPLANT
DRSG AQUACEL AG ADV 3.5X14 (GAUZE/BANDAGES/DRESSINGS) ×1 IMPLANT
DRSG TEGADERM 4X4.75 (GAUZE/BANDAGES/DRESSINGS) ×1 IMPLANT
DURAPREP 26ML APPLICATOR (WOUND CARE) ×2 IMPLANT
ELECT BLADE TIP CTD 4 INCH (ELECTRODE) ×2 IMPLANT
ELECT REM PT RETURN 9FT ADLT (ELECTROSURGICAL) ×2
ELECTRODE REM PT RTRN 9FT ADLT (ELECTROSURGICAL) ×1 IMPLANT
EVACUATOR 1/8 PVC DRAIN (DRAIN) ×2 IMPLANT
FACESHIELD LNG OPTICON STERILE (SAFETY) ×8 IMPLANT
GAUZE SPONGE 2X2 8PLY STRL LF (GAUZE/BANDAGES/DRESSINGS) IMPLANT
GLOVE BIOGEL PI IND STRL 7.5 (GLOVE) IMPLANT
GLOVE BIOGEL PI IND STRL 8 (GLOVE) ×1 IMPLANT
GLOVE BIOGEL PI INDICATOR 7.5 (GLOVE) ×4
GLOVE BIOGEL PI INDICATOR 8 (GLOVE) ×3
GLOVE ECLIPSE 8.0 STRL XLNG CF (GLOVE) ×4 IMPLANT
GLOVE SURG SS PI 6.5 STRL IVOR (GLOVE) IMPLANT
GLOVE SURG SS PI 7.5 STRL IVOR (GLOVE) ×5 IMPLANT
GOWN PREVENTION PLUS LG XLONG (DISPOSABLE) ×2 IMPLANT
GOWN STRL REIN XL XLG (GOWN DISPOSABLE) ×6 IMPLANT
IMMOBILIZER KNEE 20 (SOFTGOODS) ×2
IMMOBILIZER KNEE 20 THIGH 36 (SOFTGOODS) ×1 IMPLANT
KIT BASIN OR (CUSTOM PROCEDURE TRAY) ×2 IMPLANT
MANIFOLD NEPTUNE II (INSTRUMENTS) ×4 IMPLANT
NDL SAFETY ECLIPSE 18X1.5 (NEEDLE) IMPLANT
NEEDLE HYPO 18GX1.5 SHARP (NEEDLE) ×2
NEEDLE HYPO 22GX1.5 SAFETY (NEEDLE) ×2 IMPLANT
PACK TOTAL JOINT (CUSTOM PROCEDURE TRAY) ×2 IMPLANT
PAD ABD 8X10 STRL (GAUZE/BANDAGES/DRESSINGS) IMPLANT
POSITIONER SURGICAL ARM (MISCELLANEOUS) ×2 IMPLANT
SCREW 6.5MMX30MM (Screw) ×1 IMPLANT
SCREW 6.5MMX35MM (Screw) ×1 IMPLANT
SPONGE GAUZE 2X2 STER 10/PKG (GAUZE/BANDAGES/DRESSINGS) ×1
SPONGE GAUZE 4X4 12PLY (GAUZE/BANDAGES/DRESSINGS) IMPLANT
SPONGE LAP 18X18 X RAY DECT (DISPOSABLE) ×1 IMPLANT
SPONGE LAP 4X18 X RAY DECT (DISPOSABLE) ×2 IMPLANT
SPONGE SURGIFOAM ABS GEL 100 (HEMOSTASIS) ×2 IMPLANT
STAPLER VISISTAT 35W (STAPLE) IMPLANT
SUCTION FRAZIER TIP 10 FR DISP (SUCTIONS) ×2 IMPLANT
SUT MNCRL AB 4-0 PS2 18 (SUTURE) ×1 IMPLANT
SUT VIC AB 0 CT1 27 (SUTURE)
SUT VIC AB 0 CT1 27XBRD ANTBC (SUTURE) IMPLANT
SUT VIC AB 1 CT1 27 (SUTURE) ×8
SUT VIC AB 1 CT1 27XBRD ANTBC (SUTURE) ×4 IMPLANT
SUT VIC AB 2-0 CT1 27 (SUTURE) ×8
SUT VIC AB 2-0 CT1 TAPERPNT 27 (SUTURE) IMPLANT
SYR 20CC LL (SYRINGE) ×2 IMPLANT
SYRINGE 10CC LL (SYRINGE) ×1 IMPLANT
TOWEL OR 17X26 10 PK STRL BLUE (TOWEL DISPOSABLE) ×4 IMPLANT
TRAY FOLEY CATH 14FRSI W/METER (CATHETERS) ×2 IMPLANT
WATER STERILE IRR 1500ML POUR (IV SOLUTION) ×4 IMPLANT

## 2013-09-19 NOTE — Transfer of Care (Signed)
Immediate Anesthesia Transfer of Care Note  Patient: [name]  Procedure(s) Performed: Procedure(s): RIGHT TOTAL HIP ARTHROPLASTY (Right)  Patient Location: PACU  Anesthesia Type:General  Level of Consciousness: awake, alert , oriented and patient cooperative  Airway & Oxygen Therapy: Patient Spontanous Breathing and Patient connected to face mask oxygen  Post-op Assessment: Report given to PACU RN, Post -op Vital signs reviewed and stable and Patient moving all extremities X 4  Post vital signs: stable  Complications: No apparent anesthesia complications

## 2013-09-19 NOTE — Anesthesia Preprocedure Evaluation (Signed)
Anesthesia Evaluation  Patient identified by MRN, date of birth, ID band Patient awake    Reviewed: Allergy & Precautions, H&P , NPO status , Patient's Chart, lab work & pertinent test results  Airway Mallampati: II TM Distance: >3 FB Neck ROM: full    Dental  (+) Caps and Dental Advisory Given Cap front upper lateral right incissor:   Pulmonary asthma , sleep apnea and Continuous Positive Airway Pressure Ventilation , former smoker,  Intermittent asthma breath sounds clear to auscultation  Pulmonary exam normal       Cardiovascular Exercise Tolerance: Good hypertension, Pt. on medications Rhythm:regular Rate:Normal     Neuro/Psych Panic disordernegative neurological ROS  negative psych ROS   GI/Hepatic negative GI ROS, Neg liver ROS,   Endo/Other  Hypothyroidism   Renal/GU negative Renal ROS  negative genitourinary   Musculoskeletal   Abdominal   Peds  Hematology negative hematology ROS (+)   Anesthesia Other Findings   Reproductive/Obstetrics negative OB ROS                           Anesthesia Physical Anesthesia Plan  ASA: III  Anesthesia Plan: General   Post-op Pain Management:    Induction: Intravenous  Airway Management Planned: Oral ETT  Additional Equipment:   Intra-op Plan:   Post-operative Plan: Extubation in OR  Informed Consent: I have reviewed the patients History and Physical, chart, labs and discussed the procedure including the risks, benefits and alternatives for the proposed anesthesia with the patient or authorized representative who has indicated his/her understanding and acceptance.   Dental Advisory Given  Plan Discussed with: CRNA and Surgeon  Anesthesia Plan Comments:         Anesthesia Quick Evaluation

## 2013-09-19 NOTE — Progress Notes (Signed)
Dr. Leta Jungling made aware of patient's heart rates being in the 40s- orders given- Robinul 0.1mg  IVP given as ordered.

## 2013-09-19 NOTE — Progress Notes (Signed)
Heart rate now 59- sinus bradycardia

## 2013-09-19 NOTE — Brief Op Note (Signed)
09/19/2013  4:26 PM  PATIENT:  [name]  67 y.o. female  PRE-OPERATIVE DIAGNOSIS:  OA OF RIGHT HIP with Obesity  POST-OPERATIVE DIAGNOSIS:  OA OF RIGHT HIP with Obesity  PROCEDURE:  Procedure(s): RIGHT TOTAL HIP ARTHROPLASTY (Right),Complex  SURGEON:  Surgeon(s) and Role:    * Drucilla Schmidt, MD - Assisting    * Jacki Cones, MD - Primary  PHYSICIAN ASSISTANT:Amber [place] PA   ASSISTANTS: Marlowe Kays MD and Dimitri Ped PA  ANESTHESIA:   general  EBL:  Total I/O In: 3250 [I.V.:2500; Blood:750] Out: 1700 [Urine:600; Blood:1100]  BLOOD ADMINISTERED:2-units of Packed cells given in OR CC PRBC  DRAINS: (One) Hemovact drain(s) in the Right Hip with  Suction Open   LOCAL MEDICATIONS USED:  BUPIVICAINE 20cc mixed with 20cc of Normal Saline  SPECIMEN:  No Specimen  DISPOSITION OF SPECIMEN:  N/A  COUNTS:  YES  TOURNIQUET:  * No tourniquets in log *  DICTATION: .Other Dictation: Dictation Number [id number]  PLAN OF CARE: Admit to inpatient   PATIENT DISPOSITION:  Stable in OR after 2-units of packed cells   Delay start of Pharmacological VTE agent (>24hrs) due to surgical blood loss or risk of bleeding: yes

## 2013-09-19 NOTE — Interval H&P Note (Signed)
History and Physical Interval Note:  09/19/2013 1:57 PM  [name]  has presented today for surgery, with the diagnosis of OA OF RIGHT HIP  The various methods of treatment have been discussed with the patient and family. After consideration of risks, benefits and other options for treatment, the patient has consented to  Procedure(s): RIGHT TOTAL HIP ARTHROPLASTY (Right) as a surgical intervention .  The patient's history has been reviewed, patient examined, no change in status, stable for surgery.  I have reviewed the patient's chart and labs.  Questions were answered to the patient's satisfaction.     Arzell Mcgeehan A

## 2013-09-19 NOTE — Progress Notes (Signed)
Portable AP Right Hip X-ray done. 

## 2013-09-19 NOTE — Progress Notes (Signed)
Hgb. And Hct. Results called to Dr. Darrelyn Hillock.

## 2013-09-19 NOTE — Progress Notes (Signed)
Hgb. And Hct. Drawn by lab. 

## 2013-09-19 NOTE — Progress Notes (Signed)
X-ray results noted 

## 2013-09-19 NOTE — Progress Notes (Signed)
Pt placed on CPAP set at 11 CMH2O per home settings via nasal pillows with 2 LPM o2 bleed in.  Pt tolerating well at this time.  RT to monitor and assess as needed.

## 2013-09-19 NOTE — Anesthesia Postprocedure Evaluation (Signed)
  Anesthesia Post-op Note  Patient: [name]  Procedure(s) Performed: Procedure(s) (LRB): RIGHT TOTAL HIP ARTHROPLASTY (Right)  Patient Location: PACU  Anesthesia Type: General  Level of Consciousness: awake and alert   Airway and Oxygen Therapy: Patient Spontanous Breathing  Post-op Pain: mild  Post-op Assessment: Post-op Vital signs reviewed, Patient's Cardiovascular Status Stable, Respiratory Function Stable, Patent Airway and No signs of Nausea or vomiting  Last Vitals:  Filed Vitals:   09/19/13 1842  BP: 146/65  Pulse: 57  Temp: 36.4 C  Resp: 16    Post-op Vital Signs: stable   Complications: No apparent anesthesia complications

## 2013-09-20 ENCOUNTER — Encounter (HOSPITAL_COMMUNITY): Payer: Self-pay | Admitting: Orthopedic Surgery

## 2013-09-20 DIAGNOSIS — D62 Acute posthemorrhagic anemia: Secondary | ICD-10-CM | POA: Diagnosis not present

## 2013-09-20 LAB — BASIC METABOLIC PANEL
BUN: 7 mg/dL (ref 6–23)
CO2: 23 mEq/L (ref 19–32)
Calcium: 8.4 mg/dL (ref 8.4–10.5)
Creatinine, Ser: 0.64 mg/dL (ref 0.50–1.10)
GFR calc Af Amer: >90 mL/min (ref >90)
Sodium: 138 mEq/L (ref 135–145)

## 2013-09-20 LAB — CBC
MCH: 30.5 pg (ref 26.0–34.0)
MCHC: 34.5 g/dL (ref 30.0–36.0)
MCV: 88.6 fL (ref 78.0–100.0)
Platelets: 165 10*3/uL (ref 150–400)
RDW: 14.2 % (ref 11.5–15.5)

## 2013-09-20 NOTE — Progress Notes (Signed)
Nurse was informed by PT that pt's arm was swelling at IV site. Assessment concluded infiltration of IV. IV removed. Per pt request, ice applied. Pt also requested not to restart IV unless necessary. IV not restarted at this time.

## 2013-09-20 NOTE — Progress Notes (Signed)
Utilization review completed.  

## 2013-09-20 NOTE — Evaluation (Signed)
Physical Therapy Evaluation Patient Details Name: [name] MRN: [id number] DOB: 23-Oct-1945 Today's Date: 09/20/2013 Time: 2956-2130 PT Time Calculation (min): 54 min  PT Assessment / Plan / Recommendation History of Present Illness  R Posterior THA, received 2 units blood post op.09/19/13  Clinical Impression  Pt tolerated mobilizing to recliner, BP low, not dizzy. RN aware. Pt will benefit from PT to address problems . Pt will DC to SNF.    PT Assessment       Follow Up Recommendations  SNF    Does the patient have the potential to tolerate intense rehabilitation      Barriers to Discharge Decreased caregiver support      Equipment Recommendations  Rolling walker with 5" wheels    Recommendations for Other Services     Frequency 7X/week    Precautions / Restrictions Precautions Precautions: Posterior Hip Required Braces or Orthoses: Knee Immobilizer - Right (not specified. removed for getting OOB) Restrictions Weight Bearing Restrictions: No   Pertinent Vitals/Pain Pain 6-7 when moving.ice applied, had tylenol and robaxin. BP supine=98/52  sitting 88/44  after  Transfer 104/63 HR range 52-66 sats 100% RA,      Mobility  Bed Mobility Bed Mobility: Supine to Sit;Sitting - Scoot to Edge of Bed;Sit to Supine Supine to Sit: 3: Mod assist Sitting - Scoot to Edge of Bed: 3: Mod assist Details for Bed Mobility Assistance: cues for posterior hip precautions, asistance to move RLE to edge. Transfers Transfers: Stand to Sit;Sit to Stand Sit to Stand: 1: +2 Total assist;With upper extremity assist;From bed Sit to Stand: Patient Percentage: 60% Stand to Sit: 1: +2 Total assist;With upper extremity assist;To chair/3-in-1 Stand to Sit: Patient Percentage: 60% Details for Transfer Assistance: cues for R leg and UE placement. Ambulation/Gait Ambulation/Gait Assistance: 1: +2 Total assist Ambulation/Gait: Patient Percentage: 60% Ambulation Distance (Feet): 5  Feet Assistive device: Rolling walker Ambulation/Gait Assistance Details: cues for PWB, hip precautions. Gait Pattern: Step-to pattern  Placed roll between knees  While in recliner.   Exercises Total Joint Exercises Ankle Circles/Pumps: AROM;Right;10 reps;Supine Quad Sets: AROM;Right;10 reps;Supine Heel Slides: AAROM;Right;10 reps;Supine   PT Diagnosis: Difficulty walking;Acute pain  PT Problem List: Decreased strength;Decreased range of motion;Decreased activity tolerance;Decreased mobility;Decreased coordination;Cardiopulmonary status limiting activity;Decreased knowledge of precautions;Decreased safety awareness;Pain;Decreased knowledge of use of DME PT Treatment Interventions: DME instruction;Gait training;Functional mobility training;Therapeutic activities;Patient/family education     PT Goals(Current goals can be found in the care plan section) Acute Rehab PT Goals Patient Stated Goal: I want to getup up. PT Goal Formulation: With patient Time For Goal Achievement: 09/27/13 Potential to Achieve Goals: Good  Visit Information  Last PT Received On: 09/20/13 Assistance Needed: +2 History of Present Illness: R Posterior THA, received 2 units blood post op.09/19/13       Prior Functioning  Home Living Family/patient expects to be discharged to:: Private residence Living Arrangements: Alone Available Help at Discharge: Skilled Nursing Facility Home Layout: One level Home Equipment: None Prior Function Level of Independence: Independent Communication Communication: No difficulties    Cognition  Cognition Arousal/Alertness: Awake/alert Behavior During Therapy: WFL for tasks assessed/performed Overall Cognitive Status: Within Functional Limits for tasks assessed    Extremity/Trunk Assessment Lower Extremity Assessment Lower Extremity Assessment: RLE deficits/detail RLE Deficits / Details: maximum assistance to move RLE over edge of bed.   Balance    End of Session PT  - End of Session Equipment Utilized During Treatment: Gait belt Activity Tolerance: Patient limited by fatigue Patient left:  in chair;with call bell/phone within reach;with family/visitor present Nurse Communication: Mobility status (low BP)  GP     [name] 09/20/2013, 12:02 PM Blanchard Kelch PT [phone]

## 2013-09-20 NOTE — Progress Notes (Signed)
Clinical Social Work Department CLINICAL SOCIAL WORK PLACEMENT NOTE 09/20/2013  Patient:  LAVENA, LORETTO  Account Number:  [id number] Admit date:  09/19/2013  Clinical Social Worker:  Cori Razor, LCSW  Date/time:  09/20/2013 01:47 PM  Clinical Social Work is seeking post-discharge placement for this patient at the following level of care:   SKILLED NURSING   (*CSW will update this form in Epic as items are completed)   09/20/2013  Patient/family provided with [facility] Department of Clinical Social Work's list of facilities offering this level of care within the geographic area requested by the patient (or if unable, by the patient's family).  09/20/2013  Patient/family informed of their freedom to choose among providers that offer the needed level of care, that participate in Medicare, Medicaid or managed care program needed by the patient, have an available bed and are willing to accept the patient.    Patient/family informed of [facility]' ownership interest in [facility], as well as of the fact that they are under no obligation to receive care at this facility.  PASARR submitted to EDS on 09/19/2013 PASARR number received from [employer] on 09/19/2013  FL2 transmitted to all facilities in geographic area requested by pt/family on  09/20/2013 FL2 transmitted to all facilities within larger geographic area on   Patient informed that his/her managed care company has contracts with or will negotiate with  certain facilities, including the following:     Patient/family informed of bed offers received:  09/20/2013 Patient chooses bed at [facility] Physician recommends and patient chooses bed at    Patient to be transferred to [facility] on   Patient to be transferred to facility by   The following physician request were entered in Epic:   Additional Comments: Bed offer pending BCBS authorization.  Cori Razor LCSW [phone]

## 2013-09-20 NOTE — Plan of Care (Signed)
Problem: Phase I Progression Outcomes Goal: Dangle or out of bed evening of surgery Outcome: Not Met (add Reason) Movement stimulated nausea.

## 2013-09-20 NOTE — Progress Notes (Signed)
Physical Therapy Treatment Patient Details Name: [name] MRN: [id number] DOB: 12-22-1945 Today's Date: 09/20/2013 Time: 4098-1191 PT Time Calculation (min): 23 min  PT Assessment / Plan / Recommendation  History of Present Illness R Posterior THA, received 2 units blood post op.09/19/13   PT Comments   Pt tolerated second session better. Will monitor BP tomorrow.  Follow Up Recommendations  SNF     Does the patient have the potential to tolerate intense rehabilitation     Barriers to Discharge Decreased caregiver support      Equipment Recommendations  Rolling walker with 5" wheels    Recommendations for Other Services    Frequency 7X/week   Progress towards PT Goals Progress towards PT goals: Progressing toward goals  Plan Current plan remains appropriate    Precautions / Restrictions Precautions Precautions: Posterior Hip Required Braces or Orthoses: Knee Immobilizer - Right   Pertinent Vitals/Pain 5 R hip, ice applied.    Mobility  Bed Mobility Bed Mobility: Supine to Sit;Sitting - Scoot to [place] of Bed;Sit to Supine Supine to Sit: 3: Mod assist Sitting - Scoot to Edge of Bed: 3: Mod assist Sit to Supine: 1: +2 Total assist Sit to Supine: Patient Percentage: 30% Details for Bed Mobility Assistance: assistance for RLE onto bed. cues for precautions., position of trunk. Transfers Transfers: Stand to Sit;Sit to Stand Sit to Stand: 1: +2 Total assist;With upper extremity assist;From chair/3-in-1 Sit to Stand: Patient Percentage: 60% Stand to Sit: To bed;1: +2 Total assist Stand to Sit: Patient Percentage: 60% Details for Transfer Assistance: cues for R leg and UE placement.cue to back up to bed. Ambulation/Gait Ambulation/Gait Assistance: 1: +2 Total assist Ambulation/Gait: Patient Percentage: 60% Ambulation Distance (Feet): 5 Feet Assistive device: Rolling walker Ambulation/Gait Assistance Details: cues for PWB, hip precautions. Gait Pattern: Step-to  pattern    Exercises Total Joint Exercises Ankle Circles/Pumps: AROM;Right;10 reps;Supine Quad Sets: AROM;Right;10 reps;Supine Heel Slides: AAROM;Right;10 reps;Supine   PT Diagnosis: Difficulty walking;Acute pain  PT Problem List: Decreased strength;Decreased range of motion;Decreased activity tolerance;Decreased mobility;Decreased coordination;Cardiopulmonary status limiting activity;Decreased knowledge of precautions;Decreased safety awareness;Pain;Decreased knowledge of use of DME PT Treatment Interventions: DME instruction;Gait training;Functional mobility training;Therapeutic activities;Patient/family education   PT Goals (current goals can now be found in the care plan section) Acute Rehab PT Goals Patient Stated Goal: I want to getup up. PT Goal Formulation: With patient Time For Goal Achievement: 09/27/13 Potential to Achieve Goals: Good  Visit Information  Last PT Received On: 09/20/13 Assistance Needed: +2 History of Present Illness: R Posterior THA, received 2 units blood post op.09/19/13    Subjective Data  Patient Stated Goal: I want to getup up.   Cognition  Cognition Arousal/Alertness: Awake/alert    Balance     End of Session PT - End of Session Equipment Utilized During Treatment: Gait belt Activity Tolerance: Patient tolerated treatment well Patient left: in bed;with call bell/phone within reach;with family/visitor present Nurse Communication: Mobility status   GP     [name] 09/20/2013, 3:54 PM

## 2013-09-20 NOTE — Progress Notes (Signed)
   Subjective: 1 Day Post-Op Procedure(s) (LRB): RIGHT TOTAL HIP ARTHROPLASTY (Right) Patient reports pain as moderate.   Patient seen in rounds without Dr. Darrelyn Hillock. Patient is well, and has had no acute complaints or problems at the present time. She had some issues with nausea last night but is feeling better this morning. She denies SOB and chest pain.  We will start therapy today.  Plan is to go Skilled nursing facility after hospital stay.  Objective: Vital signs in last 24 hours: Temp:  [97.3 F (36.3 C)-98.7 F (37.1 C)] 98.7 F (37.1 C) (12/11 0550) Pulse Rate:  [42-97] 92 (12/11 0550) Resp:  [6-20] 16 (12/11 0550) BP: (96-158)/(55-82) 96/63 mmHg (12/11 0550) SpO2:  [95 %-100 %] 98 % (12/11 0550) Weight:  [86.183 kg (190 lb)] 86.183 kg (190 lb) (12/10 1400)  Intake/Output from previous day:  Intake/Output Summary (Last 24 hours) at 09/20/13 0716 Last data filed at 09/20/13 0618  Gross per 24 hour  Intake   6022 ml  Output   3490 ml  Net   2532 ml     Labs:  Recent Labs  09/19/13 1710 09/20/13 0525  HGB 11.6* 10.2*    Recent Labs  09/19/13 1710 09/20/13 0525  WBC  --  8.9  RBC  --  3.34*  HCT 33.5* 29.6*  PLT  --  165    Recent Labs  09/20/13 0525  NA 138  K 3.9  CL 106  CO2 23  BUN 7  CREATININE 0.64  GLUCOSE 127*  CALCIUM 8.4    EXAM General - Patient is Alert and Oriented Extremity - Neurologically intact Neurovascular intact Dorsiflexion/Plantar flexion intact No cellulitis present Compartment soft Dressing - dressing C/D/I Motor Function - intact, moving foot and toes well on exam.  Hemovac pulled without difficulty.  Past Medical History  Diagnosis Date  . Arthritis   . Seasonal allergies   . Hx of blood clots 1967    from birth control in LUE  . UTI (urinary tract infection)   . Hypertension   . Hypothyroidism   . Asthma     mild  . H/O bronchitis   . Pneumonia     hx of  . Hypoglycemia   . Migraine     from food   . Sleep apnea     Assessment/Plan: 1 Day Post-Op Procedure(s) (LRB): RIGHT TOTAL HIP ARTHROPLASTY (Right) Active Problems:   Osteoarthritis of right hip   History of total hip replacement   Acute blood loss anemia  Estimated body mass index is 30.68 kg/(m^2) as calculated from the following:   Height as of this encounter: 5\' 6"  (1.676 m).   Weight as of this encounter: 86.183 kg (190 lb). Advance diet Up with therapy Discharge to SNF Saturday  DVT Prophylaxis - Xarelto PWB 50% right LE D/C Knee Immobilizer Hemovac Pulled Begin Therapy Hip Precautions  She is doing better this morning than she was last night. Will slowly progress to regular diet. Will begin therapy today. Plan for discharge to SNF on Saturday.   [name] LAUREN 09/20/2013, 7:16 AM

## 2013-09-20 NOTE — Op Note (Signed)
[name], [name]            ACCOUNT NO.:  [id number]  MEDICAL RECORD NO.:  [id number]  LOCATION:  1619                         FACILITY:  [facility]  PHYSICIAN:  Georges Lynch. Demetry Bendickson, M.D.DATE OF BIRTH:  Jan 11, 1946  DATE OF PROCEDURE:  09/19/2013 DATE OF DISCHARGE:                              OPERATIVE REPORT   SURGEON:  Georges Lynch. Darrelyn Hillock, M.D.  OPERATIVE ASSISTANT:  Marlowe Kays, M.D. and [place], [place].  PREOPERATIVE DIAGNOSES: 1. Severe degenerative arthritis of the right hip. 2. Obesity.  POSTOPERATIVE DIAGNOSES: 1. Severe degenerative arthritis of the right hip. 2. Obesity.  OPERATION:  Right total hip arthroplasty utilizing the DePuy system.  I utilized a high offset size 4 Tri-Lock stem.  The cup was a 54 mm pinnacle cup with 1 screw for fixation, and also utilizing the manhole cover.  The insert was an AltrX polyethylene insert inside diameter size 36 mm.  PROCEDURE:  Sterile prep and draping was carried out with the patient on the left side right side up.  The appropriate time-out was first carried out.  I also marked the appropriate right hip in the holding area.  At this time, the patient had 2 g of IV Ancef.  The posterolateral approach was carried out.  Bleeders were identified and cauterized.  At this time, the incision was carried down through the iliotibial band, the iliotibial band then was incised from proximal to distal.  Great care was taken not to injure the underlying sciatic nerve.  At this time, I then went down and partially detached the external rotators.  I then did a capsulectomy, dislocated the head, and amputated the femoral head at the appropriate neck level.  Note, we had a significant amount of bleeding from the canal, this was venous bleeding from the femoral canal.  We did utilize the box cutter to remove the cancellous bone with the trochanter, then the widening reamer, then we went down and utilized a canal finder.  I then rasped  the canal up to a size 4 Tri-Lock stem. At this point, because of the intramedullary bleeding, I packed the canal with an adrenaline-soaked sponge.  We then directed attention to the acetabulum, and at this time we completed the capsulectomy and reamed the acetabulum up to size 53 for a 54-mm cup.  At that time, we then affixed the cup in the proper position.  We utilized the [employer].  Once we determined the proper alignment, I then utilized 1 screw for fixation purposes.  We tried a second screw up into the pelvis, but for some reason, we could not get good purchase with that screw, so we did not use a second screw.  We then inserted our manhole cover in the usual fashion.  Then inserted our AltrX polyethylene liner.  We took the hip through range of motion with the appropriate neck lengths.  We considered our leg lengths at all time as well as stability.  We felt stable construct was utilized a +5 high offset head, which was 36 mm in diameter.  The head that we used was 36 mm diameter.  The cup as I mentioned was an inside diameter of 36 mm.  We went  through various neck lengths for stability purposes and felt that the +5 was the most stable. We checked leg lengths, we checked stability throughout the entire procedure.  The remaining part of the soft tissue was checked by cauterization means and we made sure we had good control of the bleeding and that we had good control.  We utilized a 36 mm diameter ceramic ball.  After we had good reduction we checked leg lengths etc. We then irrigated out the wound, packed some thrombin-soaked Gelfoam, inserted the Hemovac, and closed the wound layers in usual fashion.          ______________________________ Georges Lynch Darrelyn Hillock, M.D.     RAG/MEDQ  D:  09/19/2013  T:  09/20/2013  Job:  [id number]

## 2013-09-20 NOTE — Progress Notes (Signed)
Clinical Social Work Department BRIEF PSYCHOSOCIAL ASSESSMENT 09/20/2013  Patient:  [name], [name]     Account Number:  [id number]     Admit date:  09/19/2013  Clinical Social Worker:  Candie Chroman  Date/Time:  09/20/2013 01:13 PM  Referred by:  Physician  Date Referred:  09/20/2013 Referred for  SNF Placement   Other Referral:   Interview type:  Patient Other interview type:    PSYCHOSOCIAL DATA Living Status:  ALONE Admitted from facility:   Level of care:   Primary support name:  Boykin Reaper Primary support relationship to patient:  SIBLING Degree of support available:   unclear    CURRENT CONCERNS Current Concerns  Post-Acute Placement   Other Concerns:    SOCIAL WORK ASSESSMENT / PLAN Pt is 67 yr old female living at home prior to hospitalization. CSW met with pt / daughter to assist with d/c planning. Pt plans to have ST Rehab at [facility] following hospital d/c. SNF has been contacted and d/c plan has been confirmed pending BCBS authorization. CSW will continue to follow to assist with d/c planning needs.   Assessment/plan status:  Psychosocial Support/Ongoing Assessment of Needs Other assessment/ plan:   Information/referral to community resources:   Insurance coverage for SNF reviewed.    PATIENT'S/FAMILY'S RESPONSE TO PLAN OF CARE: " I know I need rehab and Dr. Darrelyn Hillock recommended [facility]. "  Pt is looking forward to feeling better and starting rehab.     Cori Razor LCSW [phone]

## 2013-09-21 ENCOUNTER — Other Ambulatory Visit: Payer: Self-pay

## 2013-09-21 LAB — CBC
HCT: 26.4 % — ABNORMAL LOW (ref 36.0–46.0)
Hemoglobin: 9.1 g/dL — ABNORMAL LOW (ref 12.0–15.0)
RDW: 14.3 % (ref 11.5–15.5)
WBC: 8.7 10*3/uL (ref 4.0–10.5)

## 2013-09-21 LAB — BASIC METABOLIC PANEL
BUN: 10 mg/dL (ref 6–23)
Chloride: 107 mEq/L (ref 96–112)
GFR calc Af Amer: >90 mL/min (ref >90)
GFR calc non Af Amer: 84 mL/min — ABNORMAL LOW (ref >90)
Glucose, Bld: 123 mg/dL — ABNORMAL HIGH (ref 70–99)
Potassium: 3.9 mEq/L (ref 3.5–5.1)
Sodium: 140 mEq/L (ref 135–145)

## 2013-09-21 MED ORDER — METHOCARBAMOL 500 MG PO TABS: 500.0000 mg | ORAL_TABLET | Freq: Four times a day (QID) | ORAL | Status: DC | PRN

## 2013-09-21 MED ORDER — FERROUS SULFATE 325 (65 FE) MG PO TABS: 325.0000 mg | ORAL_TABLET | Freq: Three times a day (TID) | ORAL | Status: DC

## 2013-09-21 MED ORDER — HYDROMORPHONE HCL 2 MG PO TABS: ORAL_TABLET | ORAL | Status: DC

## 2013-09-21 MED ORDER — RIVAROXABAN 10 MG PO TABS: 10.0000 mg | ORAL_TABLET | Freq: Every day | ORAL | Status: DC

## 2013-09-21 MED ORDER — POLYETHYLENE GLYCOL 3350 17 G PO PACK: 17.0000 g | PACK | Freq: Every day | ORAL | Status: DC | PRN

## 2013-09-21 MED ORDER — HYDROMORPHONE HCL 2 MG PO TABS: 2.0000 mg | ORAL_TABLET | ORAL | Status: DC | PRN

## 2013-09-21 NOTE — Discharge Summary (Signed)
Physician Discharge Summary   Patient ID: [name] MRN: [id number] DOB/AGE: August 20, 1946 67 y.o.  Admit date: 09/19/2013 Discharge date: 09/22/2013  Primary Diagnosis: Osteoarthritis, right hip  Admission Diagnoses:  Past Medical History  Diagnosis Date  . Arthritis   . Seasonal allergies   . Hx of blood clots 1967    from birth control in LUE  . UTI (urinary tract infection)   . Hypertension   . Hypothyroidism   . Asthma     mild  . H/O bronchitis   . Pneumonia     hx of  . Hypoglycemia   . Migraine     from food  . Sleep apnea    Discharge Diagnoses:   Active Problems:   Osteoarthritis of right hip   History of total hip replacement   Acute blood loss anemia  Estimated body mass index is 30.68 kg/(m^2) as calculated from the following:   Height as of this encounter: 5\' 6"  (1.676 m).   Weight as of this encounter: 86.183 kg (190 lb).  Procedure(s) (LRB): RIGHT TOTAL HIP ARTHROPLASTY (Right)   Consults: None  HPI: [name], 67 y.o. female, has a history of pain and functional disability in the right hip(s) due to arthritis and patient has failed non-surgical conservative treatments for greater than 12 weeks to include NSAID's and/or analgesics, use of assistive devices and activity modification. Onset of symptoms was gradual starting 2 years ago with gradually worsening course since that time.The patient noted no past surgery on the right hip(s). Patient currently rates pain in the right hip at 5 out of 10 with activity. Patient has night pain, worsening of pain with activity and weight bearing, pain that interfers with activities of daily living, pain with passive range of motion and crepitus. Patient has evidence of periarticular osteophytes and joint space narrowing by imaging studies. This condition presents safety issues increasing the risk of falls. There is no current active infection.   Laboratory Data: Admission on 09/19/2013  Component Date  Value Range Status  . Order Confirmation 09/19/2013 ORDER PROCESSED BY BLOOD BANK   Final  . Glucose-Capillary 09/19/2013 183* 70 - 99 mg/dL Final  . Comment 1 40/98/1191 Documented in Chart   Final  . Comment 2 09/19/2013 Notify RN   Final  . Hemoglobin 09/19/2013 11.6* 12.0 - 15.0 g/dL Final  . HCT 47/82/9562 33.5* 36.0 - 46.0 % Final  . WBC 09/20/2013 8.9  4.0 - 10.5 K/uL Final  . RBC 09/20/2013 3.34* 3.87 - 5.11 MIL/uL Final  . Hemoglobin 09/20/2013 10.2* 12.0 - 15.0 g/dL Final  . HCT 13/05/6577 29.6* 36.0 - 46.0 % Final  . MCV 09/20/2013 88.6  78.0 - 100.0 fL Final  . MCH 09/20/2013 30.5  26.0 - 34.0 pg Final  . MCHC 09/20/2013 34.5  30.0 - 36.0 g/dL Final  . RDW 46/96/2952 14.2  11.5 - 15.5 % Final  . Platelets 09/20/2013 165  150 - 400 K/uL Final  . Sodium 09/20/2013 138  135 - 145 mEq/L Final  . Potassium 09/20/2013 3.9  3.5 - 5.1 mEq/L Final  . Chloride 09/20/2013 106  96 - 112 mEq/L Final  . CO2 09/20/2013 23  19 - 32 mEq/L Final  . Glucose, Bld 09/20/2013 127* 70 - 99 mg/dL Final  . BUN 84/13/2440 7  6 - 23 mg/dL Final  . Creatinine, Ser 09/20/2013 0.64  0.50 - 1.10 mg/dL Final  . Calcium 08/07/2535 8.4  8.4 - 10.5 mg/dL  Final  . GFR calc non Af Amer 09/20/2013 90* >90 mL/min Final  . GFR calc Af Amer 09/20/2013 >90  >90 mL/min Final   Comment: (NOTE)                          The eGFR has been calculated using the CKD EPI equation.                          This calculation has not been validated in all clinical situations.                          eGFR's persistently <90 mL/min signify possible Chronic Kidney                          Disease.  . WBC 09/21/2013 8.7  4.0 - 10.5 K/uL Final  . RBC 09/21/2013 2.96* 3.87 - 5.11 MIL/uL Final  . Hemoglobin 09/21/2013 9.1* 12.0 - 15.0 g/dL Final  . HCT 47/82/9562 26.4* 36.0 - 46.0 % Final  . MCV 09/21/2013 89.2  78.0 - 100.0 fL Final  . MCH 09/21/2013 30.7  26.0 - 34.0 pg Final  . MCHC 09/21/2013 34.5  30.0 - 36.0 g/dL Final    . RDW 13/05/6577 14.3  11.5 - 15.5 % Final  . Platelets 09/21/2013 135* 150 - 400 K/uL Final  . Sodium 09/21/2013 140  135 - 145 mEq/L Final  . Potassium 09/21/2013 3.9  3.5 - 5.1 mEq/L Final  . Chloride 09/21/2013 107  96 - 112 mEq/L Final  . CO2 09/21/2013 27  19 - 32 mEq/L Final  . Glucose, Bld 09/21/2013 123* 70 - 99 mg/dL Final  . BUN 46/96/2952 10  6 - 23 mg/dL Final  . Creatinine, Ser 09/21/2013 0.77  0.50 - 1.10 mg/dL Final  . Calcium 84/13/2440 8.7  8.4 - 10.5 mg/dL Final  . GFR calc non Af Amer 09/21/2013 84* >90 mL/min Final  . GFR calc Af Amer 09/21/2013 >90  >90 mL/min Final   Comment: (NOTE)                          The eGFR has been calculated using the CKD EPI equation.                          This calculation has not been validated in all clinical situations.                          eGFR's persistently <90 mL/min signify possible Chronic Kidney                          Disease.  Hospital Outpatient Visit on 09/11/2013  Component Date Value Range Status  . WBC 09/11/2013 5.1  4.0 - 10.5 K/uL Final  . RBC 09/11/2013 4.05  3.87 - 5.11 MIL/uL Final  . Hemoglobin 09/11/2013 12.3  12.0 - 15.0 g/dL Final  . HCT 08/07/2535 36.7  36.0 - 46.0 % Final  . MCV 09/11/2013 90.6  78.0 - 100.0 fL Final  . MCH 09/11/2013 30.4  26.0 - 34.0 pg Final  . MCHC 09/11/2013 33.5  30.0 - 36.0 g/dL Final  . RDW 64/40/3474 13.7  11.5 - 15.5 %  Final  . Platelets 09/11/2013 241  150 - 400 K/uL Final  . MRSA, PCR 09/11/2013 NEGATIVE  NEGATIVE Final  . Staphylococcus aureus 09/11/2013 NEGATIVE  NEGATIVE Final   Comment:                                 The Xpert SA Assay (FDA                          approved for NASAL specimens                          in patients over 28 years of age),                          is one component of                          a comprehensive surveillance                          program.  Test performance has                          been validated by [employer] for patients greater                          than or equal to 69 year old.                          It is not intended                          to diagnose infection nor to                          guide or monitor treatment.  Marland Kitchen aPTT 09/11/2013 33  24 - 37 seconds Final  . Sodium 09/11/2013 138  135 - 145 mEq/L Final  . Potassium 09/11/2013 4.0  3.5 - 5.1 mEq/L Final  . Chloride 09/11/2013 103  96 - 112 mEq/L Final  . CO2 09/11/2013 27  19 - 32 mEq/L Final  . Glucose, Bld 09/11/2013 91  70 - 99 mg/dL Final  . BUN 16/07/9603 14  6 - 23 mg/dL Final  . Creatinine, Ser 09/11/2013 0.70  0.50 - 1.10 mg/dL Final  . Calcium 54/06/8118 9.8  8.4 - 10.5 mg/dL Final  . Total Protein 09/11/2013 7.1  6.0 - 8.3 g/dL Final  . Albumin 14/78/2956 3.7  3.5 - 5.2 g/dL Final  . AST 21/30/8657 21  0 - 37 U/L Final  . ALT 09/11/2013 14  0 - 35 U/L Final  . Alkaline Phosphatase 09/11/2013 71  39 - 117 U/L Final  . Total Bilirubin 09/11/2013 0.3  0.3 - 1.2 mg/dL Final  . GFR calc non Af Amer 09/11/2013 87* >90 mL/min Final  . GFR calc Af Amer 09/11/2013 >90  >90 mL/min Final   Comment: (NOTE)  The eGFR has been calculated using the CKD EPI equation.                          This calculation has not been validated in all clinical situations.                          eGFR's persistently <90 mL/min signify possible Chronic Kidney                          Disease.  Marland Kitchen Prothrombin Time 09/11/2013 13.0  11.6 - 15.2 seconds Final  . INR 09/11/2013 1.00  0.00 - 1.49 Final  . Color, Urine 09/11/2013 YELLOW  YELLOW Final  . APPearance 09/11/2013 CLEAR  CLEAR Final  . Specific Gravity, Urine 09/11/2013 1.016  1.005 - 1.030 Final  . pH 09/11/2013 6.0  5.0 - 8.0 Final  . Glucose, UA 09/11/2013 NEGATIVE  NEGATIVE mg/dL Final  . Hgb urine dipstick 09/11/2013 NEGATIVE  NEGATIVE Final  . Bilirubin Urine 09/11/2013 NEGATIVE  NEGATIVE Final  . Ketones, ur 09/11/2013  NEGATIVE  NEGATIVE mg/dL Final  . Protein, ur 45/40/9811 NEGATIVE  NEGATIVE mg/dL Final  . Urobilinogen, UA 09/11/2013 0.2  0.0 - 1.0 mg/dL Final  . Nitrite 91/47/8295 NEGATIVE  NEGATIVE Final  . Leukocytes, UA 09/11/2013 TRACE* NEGATIVE Final  . ABO/RH(D) 09/11/2013 A POS   Final  . Antibody Screen 09/11/2013 NEG   Final  . Sample Expiration 09/11/2013 09/22/2013   Final  . Unit Number 09/11/2013 [id number]   Final  . Blood Component Type 09/11/2013 RED CELLS,LR   Final  . Unit division 09/11/2013 00   Final  . Status of Unit 09/11/2013 ISSUED,FINAL   Final  . Transfusion Status 09/11/2013 OK TO TRANSFUSE   Final  . Crossmatch Result 09/11/2013 Compatible   Final  . Unit Number 09/11/2013 [id number]   Final  . Blood Component Type 09/11/2013 RED CELLS,LR   Final  . Unit division 09/11/2013 00   Final  . Status of Unit 09/11/2013 ISSUED,FINAL   Final  . Transfusion Status 09/11/2013 OK TO TRANSFUSE   Final  . Crossmatch Result 09/11/2013 Compatible   Final  . Unit Number 09/11/2013 [id number]   Final  . Blood Component Type 09/11/2013 RED CELLS,LR   Final  . Unit division 09/11/2013 00   Final  . Status of Unit 09/11/2013 ALLOCATED   Final  . Transfusion Status 09/11/2013 OK TO TRANSFUSE   Final  . Crossmatch Result 09/11/2013 Compatible   Final  . Unit Number 09/11/2013 [id number]   Final  . Blood Component Type 09/11/2013 RED CELLS,LR   Final  . Unit division 09/11/2013 00   Final  . Status of Unit 09/11/2013 ALLOCATED   Final  . Transfusion Status 09/11/2013 OK TO TRANSFUSE   Final  . Crossmatch Result 09/11/2013 Compatible   Final  . ABO/RH(D) 09/11/2013 A POS   Final  . WBC, UA 09/11/2013 3-6  <3 WBC/hpf Final  . Bacteria, UA 09/11/2013 RARE  RARE Final  . Urine-Other 09/11/2013 MUCOUS PRESENT   Final  Abstract on 09/11/2013  Component Date Value Range Status  . HM Mammogram 09/03/2013 normal, mammo, [facility]   Final     X-Rays:Dg Chest 2 View  09/11/2013    CLINICAL DATA:  67 year old female -preoperative respiratory examination for right hip surgery.  EXAM: CHEST  2 VIEW  COMPARISON:  None.  FINDINGS: The cardiomediastinal silhouette is unremarkable. There is no evidence of focal airspace disease, pulmonary edema, suspicious pulmonary nodule/mass, pleural effusion, or pneumothorax. No acute bony abnormalities are identified.  IMPRESSION: No evidence of active cardiopulmonary disease.   Electronically Signed   By: Laveda Abbe M.D.   On: 09/11/2013 17:58   Dg Hip Portable 1 View Right  09/19/2013   CLINICAL DATA:  Right hip replacement.  EXAM: PORTABLE RIGHT HIP - 1 VIEW  COMPARISON:  None.  FINDINGS: Single view of the right hip demonstrates a right hip arthroplasty. The entire femoral stem is visualized. Soft tissue changes in the upper thigh and a surgical drain is present. The hip arthroplasty appears to be located on this single view.  IMPRESSION: Right hip arthroplasty without complicating features.   Electronically Signed   By: Richarda Overlie M.D.   On: 09/19/2013 17:41    EKG: Orders placed during the hospital encounter of 09/11/13  . EKG 12-LEAD  . EKG 12-LEAD     Hospital Course: Patient was admitted to [facility] and taken to the OR and underwent the above state procedure without complications.  Patient tolerated the procedure well but due to significant blood loss during surgery, required transfusion of two units of blood, starting intra-operatively. She was later transferred to the recovery room and then to the orthopaedic floor for postoperative care.  They were given PO and IV analgesics for pain control following their surgery.  They were given 24 hours of postoperative antibiotics of  Anti-infectives   Start     Dose/Rate Route Frequency Ordered Stop   09/19/13 2000  ceFAZolin (ANCEF) IVPB 1 g/50 mL premix     1 g 100 mL/hr over 30 Minutes Intravenous Every 6 hours 09/19/13 1848 09/20/13 0236   09/19/13 1454  polymyxin B 500,000  Units, bacitracin 50,000 Units in sodium chloride irrigation 0.9 % 500 mL irrigation  Status:  Discontinued       As needed 09/19/13 1454 09/19/13 1648   09/19/13 1230  ceFAZolin (ANCEF) IVPB 2 g/50 mL premix     2 g 100 mL/hr over 30 Minutes Intravenous On call to O.R. 09/19/13 1222 09/19/13 1424     and started on DVT prophylaxis in the form of Xarelto.   PT and OT were ordered for total hip protocol.  The patient was allowed to be PWB 50% with therapy. Discharge planning was consulted to help with postop disposition and equipment needs.  Patient had a fair night on the evening of surgery.  They started to get up OOB with therapy on day one.  Hemovac drain was pulled without difficulty.  The knee immobilizer was removed and discontinued.  Continued to work with therapy into day two.  Dressing remained clean, dry, and intact.  By day three, the patient had progressed with therapy and meeting their goals. Hgb was holding steady. Incision was healing well.  Patient was seen in rounds and was ready to go to SNF.   Discharge Medications: Prior to Admission medications   Medication Sig Start Date End Date Taking? Authorizing Provider  acetaminophen (TYLENOL) 500 MG tablet Take 500 mg by mouth as needed for mild pain or moderate pain.    Yes Historical Provider, MD  albuterol (PROVENTIL HFA;VENTOLIN HFA) 108 (90 BASE) MCG/ACT inhaler Inhale 2 puffs into the lungs every 4 (four) hours as needed for wheezing or shortness of breath. 08/26/12  Yes Jeoffrey Massed, MD  aspirin 81 MG tablet Take 81 mg  by mouth daily.    Yes Historical Provider, MD  celecoxib (CELEBREX) 200 MG capsule Take 200 mg by mouth daily.   Yes Historical Provider, MD  cyclobenzaprine (FLEXERIL) 10 MG tablet Take 10 mg by mouth every 8 (eight) hours as needed for muscle spasms. 07/06/13 07/06/14 Yes Sheliah Hatch, MD  fexofenadine (ALLEGRA) 60 MG tablet Take 60 mg by mouth daily.   Yes Historical Provider, MD  lisinopril  (PRINIVIL,ZESTRIL) 20 MG tablet Take 20 mg by mouth at bedtime. 07/06/13  Yes Sheliah Hatch, MD  Multiple Vitamin (MULTIVITAMIN) capsule Take 1 capsule by mouth daily.   Yes Historical Provider, MD  sodium chloride (OCEAN) 0.65 % nasal spray Place 1 spray into the nose 2 (two) times daily.    Yes Historical Provider, MD  thyroid (ARMOUR) 130 MG tablet Take 130 mg by mouth at bedtime. 07/06/13  Yes Sheliah Hatch, MD  budesonide-formoterol (SYMBICORT) 160-4.5 MCG/ACT inhaler Inhale 2 puffs into the lungs 2 (two) times daily as needed (shortness of breath).     Historical Provider, MD  ferrous sulfate 325 (65 FE) MG tablet Take 1 tablet (325 mg total) by mouth 3 (three) times daily after meals. 09/21/13   Joyceline Maiorino Tamala Ser, PA-C  HYDROmorphone (DILAUDID) 2 MG tablet Take 1 tablet (2 mg total) by mouth every 3 (three) hours as needed for severe pain (Q4-6 hours PRN). 09/21/13   Jhovani Griswold Tamala Ser, PA-C  methocarbamol (ROBAXIN) 500 MG tablet Take 1 tablet (500 mg total) by mouth every 6 (six) hours as needed for muscle spasms. 09/21/13   Kollins Fenter Tamala Ser, PA-C  polyethylene glycol (MIRALAX / GLYCOLAX) packet Take 17 g by mouth daily as needed for mild constipation. 09/21/13   Yuchen Fedor Tamala Ser, PA-C  rivaroxaban (XARELTO) 10 MG TABS tablet Take 1 tablet (10 mg total) by mouth daily with breakfast. 09/21/13   Boss Danielsen Tamala Ser, PA-C    Diet: low sodium heart healthy Activity:PWB 50% for one week No bending hip over 90 degrees- A "L" Angle Do not cross legs Do not let foot roll inward When turning these patients a pillow should be placed between the patient's legs to prevent crossing. Patients should have the affected knee fully extended when trying to sit or stand from all surfaces to prevent excessive hip flexion. When ambulating and turning toward the affected side the affected leg should have the toes turned out prior to moving the walker and the rest of patient's  body as to prevent internal rotation/ turning in of the leg. Abduction pillows are the most effective way to prevent a patient from not crossing legs or turning toes in at rest. If an abduction pillow is not ordered placing a regular pillow length wise between the patient's legs is also an effective reminder. It is imperative that these precautions be maintained so that the surgical hip does not dislocate. Follow-up:in 2 weeks Disposition - Skilled nursing facility Discharged Condition: stable   Discharge Orders   Future Orders Complete By Expires   Call MD / Call 911  As directed    Comments:     If you experience chest pain or shortness of breath, CALL 911 and be transported to the hospital emergency room.  If you develope a fever above 101 F, pus (white drainage) or increased drainage or redness at the wound, or calf pain, call your surgeon's office.   Constipation Prevention  As directed    Comments:     Drink plenty of fluids.  Prune juice may be helpful.  You may use a stool softener, such as Colace (over the counter) 100 mg twice a day.  Use MiraLax (over the counter) for constipation as needed.   Diet - low sodium heart healthy  As directed    Discharge instructions  As directed    Comments:     Walk with your walker. PWB 50% for one week Do not change your dressing unless there is excess drainage Shower only, no tub bath. Call if any temperatures greater than 101 or any wound complications: [phone] during the day and ask for Dr. Jeannetta Ellis nurse, [facility] [place].   Driving restrictions  As directed    Comments:     No driving   Follow the hip precautions as taught in Physical Therapy  As directed    Increase activity slowly as tolerated  As directed        Medication List    STOP taking these medications       aspirin 81 MG tablet     celecoxib 200 MG capsule  Commonly known as:  CELEBREX     cyclobenzaprine 10 MG tablet  Commonly known as:  FLEXERIL      multivitamin capsule      TAKE these medications       acetaminophen 500 MG tablet  Commonly known as:  TYLENOL  Take 500 mg by mouth as needed for mild pain or moderate pain.     albuterol 108 (90 BASE) MCG/ACT inhaler  Commonly known as:  PROVENTIL HFA;VENTOLIN HFA  Inhale 2 puffs into the lungs every 4 (four) hours as needed for wheezing or shortness of breath.     budesonide-formoterol 160-4.5 MCG/ACT inhaler  Commonly known as:  SYMBICORT  Inhale 2 puffs into the lungs 2 (two) times daily as needed (shortness of breath).     ferrous sulfate 325 (65 FE) MG tablet  Take 1 tablet (325 mg total) by mouth 3 (three) times daily after meals.     fexofenadine 60 MG tablet  Commonly known as:  ALLEGRA  Take 60 mg by mouth daily.     HYDROmorphone 2 MG tablet  Commonly known as:  DILAUDID  Take 1 tablet (2 mg total) by mouth every 3 (three) hours as needed for severe pain (Q4-6 hours PRN).     lisinopril 20 MG tablet  Commonly known as:  PRINIVIL,ZESTRIL  Take 20 mg by mouth at bedtime.     methocarbamol 500 MG tablet  Commonly known as:  ROBAXIN  Take 1 tablet (500 mg total) by mouth every 6 (six) hours as needed for muscle spasms.     polyethylene glycol packet  Commonly known as:  MIRALAX / GLYCOLAX  Take 17 g by mouth daily as needed for mild constipation.     rivaroxaban 10 MG Tabs tablet  Commonly known as:  XARELTO  Take 1 tablet (10 mg total) by mouth daily with breakfast.     sodium chloride 0.65 % nasal spray  Commonly known as:  OCEAN  Place 1 spray into the nose 2 (two) times daily.     thyroid 130 MG tablet  Commonly known as:  ARMOUR  Take 130 mg by mouth at bedtime.           Follow-up Information   Follow up with GIOFFRE,RONALD A, MD. Schedule an appointment as soon as possible for a visit in 2 weeks.   Specialty:  Orthopedic Surgery   Contact information:   [address] [place] [address] [phone]        Signed: Dimitri Ped [facility] 09/21/2013, 7:16 AM

## 2013-09-21 NOTE — Progress Notes (Signed)
Physical Therapy Treatment Patient Details Name: [name] MRN: [id number] DOB: 10/22/1945 Today's Date: 09/21/2013 Time: 6213-0865 PT Time Calculation (min): 26 min  PT Assessment / Plan / Recommendation  History of Present Illness R Posterior THA, received 2 units blood post op.09/19/13   PT Comments   POD # 2 am session.  Assisted pt out of recliner to amb to BR then amb in hallway limited distance 2nd fatigue/pain and limited WBing PWB.  Performed THR TE's while in recliner then applied ICE.  Pt progressing slowly and will need ST Rehab at SNF prior to D/C to home to address gait instability/balance/safety after her THR.   Follow Up Recommendations  SNF     Does the patient have the potential to tolerate intense rehabilitation     Barriers to Discharge        Equipment Recommendations       Recommendations for Other Services    Frequency 7X/week   Progress towards PT Goals Progress towards PT goals: Progressing toward goals  Plan      Precautions / Restrictions Precautions Precautions: Posterior Hip Precaution Comments: Instructed pt on THP as she was only able to recall 1/3.  Handout also given.  Restrictions Weight Bearing Restrictions: Yes RLE Weight Bearing: Partial weight bearing    Pertinent Vitals/Pain C/o 6/10 ICE applied    Mobility  Bed Mobility Bed Mobility: Not assessed Details for Bed Mobility Assistance: Pt OOB in recliner Transfers Transfers: Sit to Stand;Stand to Sit Sit to Stand: 4: Min assist;With armrests;From chair/3-in-1;From toilet Stand to Sit: 4: Min assist;To toilet;To chair/3-in-1 Details for Transfer Assistance: 75% VC's on proper tech to avoid hip flex > 90 degrees and proper hand placement. Ambulation/Gait Ambulation/Gait Assistance: 4: Min assist;3: Mod assist Ambulation Distance (Feet): 22 Feet Assistive device: Rolling walker Ambulation/Gait Assistance Details: 25% VC's on proper sequencing and R LE placement to ensure  PWB.  Pt demon limited amb distance and required increased time. Gait Pattern: Step-to pattern;Decreased stance time - right Gait velocity: decreased    Exercises   Total Hip Replacement TE's 10 reps ankle pumps 10 reps knee presses 10 reps heel slides 10 reps SAQ's 10 reps ABD Followed by ICE    PT Goals (current goals can now be found in the care plan section)    Visit Information  Last PT Received On: 09/21/13 Assistance Needed: +1 History of Present Illness: R Posterior THA, received 2 units blood post op.09/19/13    Subjective Data      Cognition       Balance     End of Session PT - End of Session Equipment Utilized During Treatment: Gait belt Activity Tolerance: Patient limited by fatigue;Patient limited by pain Patient left: in chair;with call bell/phone within reach   Felecia Shelling  PTA [facility]  Acute  Rehab Pager      [phone]

## 2013-09-21 NOTE — Progress Notes (Addendum)
CSW sent updated physical and occupational therapy notes to [facility]. Still no auth as of this writing. CSW met with patient and informed her of progress. Will follow.  Easten Maceachern C. Rileigh Kawashima MSW, [place] [phone] Berkley Harvey obtained. Patient can dc to [facility] tomorrow.  Seibert Keeter C. Lailee Hoelzel MSW, LCSW [phone]

## 2013-09-21 NOTE — Progress Notes (Signed)
Physical Therapy Treatment Patient Details Name: [name] MRN: [id number] DOB: 28-Jul-1946 Today's Date: 09/21/2013 Time: 1228-0958 PT Time Calculation (min): 26 min  PT Assessment / Plan / Recommendation  History of Present Illness R Posterior THA   PT Comments   POD # 2 pt progressing slowly demonstrating gait/balance deficits with PWB restriction and limited activity tolerance due to pain.  Pt will need ST Rehab at SNF prior to D/C to amb.    Follow Up Recommendations  SNF     Does the patient have the potential to tolerate intense rehabilitation     Barriers to Discharge        Equipment Recommendations       Recommendations for Other Services    Frequency 7X/week   Progress towards PT Goals Progress towards PT goals: Progressing toward goals  Plan      Precautions / Restrictions Precautions Precautions: Posterior Hip Precaution Comments: Pt recalled 2/3 THP so re educated again and demonstrated Restrictions Weight Bearing Restrictions: Yes RLE Weight Bearing: Partial weight bearing    Pertinent Vitals/Pain C/o 7/10 hip pain ICE applied    Mobility  Bed Mobility Bed Mobility: Sit to Supine Sit to Supine: 4: Min assist;3: Mod assist Details for Bed Mobility Assistance: Mod/min assist back in bed with increased time due to increased c/o hip pain Transfers Transfers: Sit to Stand;Stand to Sit Sit to Stand: 4: Min assist;With armrests;From chair/3-in-1;From toilet Stand to Sit: 4: Min assist;To toilet;To bed Details for Transfer Assistance: 75% VC's on proper tech to avoid hip flex > 90 degrees and proper hand placement. Ambulation/Gait Ambulation/Gait Assistance: 4: Min assist;3: Mod assist Ambulation Distance (Feet): 26 Feet Assistive device: Rolling walker Ambulation/Gait Assistance Details: 25% VC's on safety with turns and backward gait sequencing while adhering to PWB.  Pt required increased time. Gait Pattern: Step-to pattern;Decreased stance  time - right Gait velocity: decreased     PT Goals (current goals can now be found in the care plan section)    Visit Information  Last PT Received On: 09/21/13 Assistance Needed: +1 History of Present Illness: R Posterior THA    Subjective Data      Cognition       Balance     End of Session PT - End of Session Equipment Utilized During Treatment: Gait belt Activity Tolerance: Patient limited by fatigue;Patient limited by pain Patient left: in bed;with call bell/phone within reach   Felecia Shelling  PTA [facility] - Inpatient  Acute  Rehab Pager      [phone]

## 2013-09-21 NOTE — Evaluation (Signed)
Occupational Therapy Evaluation Patient Details Name: [name] MRN: [id number] DOB: Jan 09, 1946 Today's Date: 09/21/2013 Time: 4098-1191 OT Time Calculation (min): 28 min  OT Assessment / Plan / Recommendation History of present illness R Posterior THA, received 2 units blood post op.09/19/13   Clinical Impression   Pt was admitted for R THA, and has posterior THPS as well as PWB. She will benefit from skilled OT to increase safety and independence with adls following these precautions    OT Assessment  Patient needs continued OT Services    Follow Up Recommendations  SNF    Barriers to Discharge      Equipment Recommendations  3 in 1 bedside comode    Recommendations for Other Services    Frequency  Min 2X/week    Precautions / Restrictions Precautions Precautions: Posterior Hip Required Braces or Orthoses: Knee Immobilizer - Right Restrictions Weight Bearing Restrictions: Yes RLE Weight Bearing: Partial weight bearing   Pertinent Vitals/Pain 4/10 R hip; premedicated and repostiioned.  Ice removed    ADL  Grooming: Teeth care;Set up Where Assessed - Grooming: Supported sitting Upper Body Bathing: Set up Where Assessed - Upper Body Bathing: Supported sitting Lower Body Bathing: Moderate assistance Where Assessed - Lower Body Bathing: Supported sit to stand Upper Body Dressing: Set up Where Assessed - Upper Body Dressing: Supported sitting Lower Body Dressing: Moderate assistance (with ae) Where Assessed - Lower Body Dressing: Supported sit to stand Toilet Transfer: Minimal assistance Toilet Transfer Method: Surveyor, minerals: Materials engineer and Hygiene: Moderate assistance Where Assessed - Toileting Clothing Manipulation and Hygiene: Sit to stand from 3-in-1 or toilet Equipment Used: Reacher;Rolling walker;Sock aid Transfers/Ambulation Related to ADLs: Pt was on 3:1 when OT arrived.  Ambulated 3 feet  to recliner and performed ADL ADL Comments: Educated on and used AE for adl.  Pt could not use standard sock aid for R foot--too narrow.  Pt needed mod cues during adls not to allow hip to roll inward.  Pt recalled 1/3 thps without cues.    OT Diagnosis: Generalized weakness  OT Problem List: Decreased strength;Decreased activity tolerance;Decreased knowledge of use of DME or AE;Decreased knowledge of precautions;Pain OT Treatment Interventions: Self-care/ADL training;DME and/or AE instruction;Patient/family education   OT Goals(Current goals can be found in the care plan section) Acute Rehab OT Goals Patient Stated Goal: go to rehab  OT Goal Formulation: With patient Time For Goal Achievement: 09/28/13 Potential to Achieve Goals: Good ADL Goals Pt Will Perform Lower Body Bathing: with min assist;sit to/from stand;with adaptive equipment Pt Will Perform Lower Body Dressing: with min assist;with adaptive equipment;sit to/from stand Pt Will Transfer to Toilet: with min guard assist;bedside commode;ambulating Pt Will Perform Toileting - Clothing Manipulation and hygiene: with min assist;sit to/from stand Additional ADL Goal #1: pt will state 3/3 thps  Visit Information  Last OT Received On: 09/21/13 Assistance Needed: +1 History of Present Illness: R Posterior THA, received 2 units blood post op.09/19/13       Prior Functioning     Home Living Family/patient expects to be discharged to:: Skilled nursing facility Prior Function Level of Independence: Independent Communication Communication: No difficulties         Vision/Perception     Cognition  Cognition Arousal/Alertness: Awake/alert Behavior During Therapy: WFL for tasks assessed/performed Overall Cognitive Status: Within Functional Limits for tasks assessed    Extremity/Trunk Assessment Upper Extremity Assessment Upper Extremity Assessment: Overall WFL for tasks assessed     Mobility  Transfers Sit to Stand: 4:  Min assist;With armrests;From chair/3-in-1 Details for Transfer Assistance: cues for UE/RLE position     Exercise     Balance     End of Session OT - End of Session Activity Tolerance: Patient tolerated treatment well Patient left: in chair;with call bell/phone within reach  GO     [facility] 09/21/2013, 9:29 AM Marica Otter, OTR/L [phone] 09/21/2013

## 2013-09-21 NOTE — Plan of Care (Signed)
Problem: Consults Goal: Diagnosis- Total Joint Replacement Outcome: Completed/Met Date Met:  09/21/13 Primary Total Hip

## 2013-09-21 NOTE — Progress Notes (Signed)
Subjective: 2 Days Post-Op Procedure(s) (LRB): RIGHT TOTAL HIP ARTHROPLASTY (Right) Patient reports pain as 1 on 0-10 scale. Doing very well today. Will DC to SNF Saturday.   Objective: Vital signs in last 24 hours: Temp:  [98.3 F (36.8 C)] 98.3 F (36.8 C) (12/12 0523) Pulse Rate:  [73-96] 73 (12/12 0523) Resp:  [14-18] 16 (12/12 0523) BP: (100-110)/(59-70) 108/70 mmHg (12/12 0523) SpO2:  [96 %-99 %] 99 % (12/12 0523)  Intake/Output from previous day: 12/11 0701 - 12/12 0700 In: 1550 [P.O.:840; I.V.:710] Out: 1650 [Urine:800; Stool:850] Intake/Output this shift:     Recent Labs  09/19/13 1710 09/20/13 0525 09/21/13 0455  HGB 11.6* 10.2* 9.1*    Recent Labs  09/20/13 0525 09/21/13 0455  WBC 8.9 8.7  RBC 3.34* 2.96*  HCT 29.6* 26.4*  PLT 165 135*    Recent Labs  09/20/13 0525 09/21/13 0455  NA 138 140  K 3.9 3.9  CL 106 107  CO2 23 27  BUN 7 10  CREATININE 0.64 0.77  GLUCOSE 127* 123*  CALCIUM 8.4 8.7   No results found for this basename: LABPT, INR,  in the last 72 hours  Dorsiflexion/Plantar flexion intact  Assessment/Plan: 2 Days Post-Op Procedure(s) (LRB): RIGHT TOTAL HIP ARTHROPLASTY (Right) Discharge to SNF Saturday.  Deanie Jupiter A 09/21/2013, 7:17 AM

## 2013-09-22 LAB — CBC
HCT: 23.9 % — ABNORMAL LOW (ref 36.0–46.0)
Hemoglobin: 8.1 g/dL — ABNORMAL LOW (ref 12.0–15.0)
MCH: 30.7 pg (ref 26.0–34.0)
MCHC: 33.9 g/dL (ref 30.0–36.0)
Platelets: 145 10*3/uL — ABNORMAL LOW (ref 150–400)
RBC: 2.64 MIL/uL — ABNORMAL LOW (ref 3.87–5.11)

## 2013-09-22 NOTE — Progress Notes (Signed)
Subjective: 3 Days Post-Op Procedure(s) (LRB): RIGHT TOTAL HIP ARTHROPLASTY (Right) Patient reports pain as 3 on 0-10 scale.    Objective: Vital signs in last 24 hours: Temp:  [97.8 F (36.6 C)-99.7 F (37.6 C)] 98.7 F (37.1 C) (12/13 0500) Pulse Rate:  [64-93] 75 (12/13 0500) Resp:  [15-18] 16 (12/13 0500) BP: (111-114)/(60-76) 111/70 mmHg (12/13 0500) SpO2:  [97 %-98 %] 97 % (12/13 0500)  Intake/Output from previous day: 12/12 0701 - 12/13 0700 In: 600 [P.O.:600] Out: 2100 [Urine:2100] Intake/Output this shift: Total I/O In: -  Out: 350 [Urine:350]   Recent Labs  09/19/13 1710 09/20/13 0525 09/21/13 0455 09/22/13 0447  HGB 11.6* 10.2* 9.1* 8.1*    Recent Labs  09/21/13 0455 09/22/13 0447  WBC 8.7 7.0  RBC 2.96* 2.64*  HCT 26.4* 23.9*  PLT 135* 145*    Recent Labs  09/20/13 0525 09/21/13 0455  NA 138 140  K 3.9 3.9  CL 106 107  CO2 23 27  BUN 7 10  CREATININE 0.64 0.77  GLUCOSE 127* 123*  CALCIUM 8.4 8.7   No results found for this basename: LABPT, INR,  in the last 72 hours No SOB dizziness Neurologically intact Neurovascular intact Sensation intact distally Dorsiflexion/Plantar flexion intact Compartment soft Dressing with scant drainage Swelling thigh  Assessment/Plan: 3 Days Post-Op Procedure(s) (LRB): RIGHT TOTAL HIP ARTHROPLASTY (Right) Elevate Advance diet Plan for discharge tomorrow  [name] C 09/22/2013, 8:01 AM

## 2013-09-22 NOTE — Progress Notes (Signed)
Will plan to hold D/C today given unstable Hgb, decreased from 9.1 yesterday AM to 8.1 this AM. Pt asymptomatic. Will recheck H&H tomorrow AM and plan for transfusion if Hgb less than 8, D/C later in the day if stable (to SNF). Discussed with Dr. Shelle Iron who is in agreement with plan. Discussed with [employer].

## 2013-09-22 NOTE — Progress Notes (Addendum)
Per RN, Pt not ready for d/c today; may be ready tomorrow.  Notified facility.  Facility suggested that CSW call tomorrow to discuss admission, if Pt ready for d/c.  Providence Crosby, LCSWA Clinical Social Work [phone]

## 2013-09-22 NOTE — Plan of Care (Signed)
Problem: Discharge Progression Outcomes Goal: Anticoagulant follow-up in place Outcome: Not Applicable Date Met:  09/22/13 xarelto Goal: Discharge plan in place and appropriate Outcome: Completed/Met Date Met:  09/22/13 To [facility] Goal: Demonstrates ADLs as appropriate Outcome: Completed/Met Date Met:  09/22/13 For SNF

## 2013-09-22 NOTE — Progress Notes (Signed)
Subjective: 3 Days Post-Op Procedure(s) (LRB): RIGHT TOTAL HIP ARTHROPLASTY (Right) Patient reports pain as 3 on 0-10 scale.    Objective: Vital signs in last 24 hours: Temp:  [97.8 F (36.6 C)-99.7 F (37.6 C)] 98.7 F (37.1 C) (12/13 0500) Pulse Rate:  [64-93] 75 (12/13 0500) Resp:  [15-18] 16 (12/13 0500) BP: (111-114)/(60-76) 111/70 mmHg (12/13 0500) SpO2:  [97 %-98 %] 97 % (12/13 0500)  Intake/Output from previous day: 12/12 0701 - 12/13 0700 In: 600 [P.O.:600] Out: 2100 [Urine:2100] Intake/Output this shift: Total I/O In: -  Out: 350 [Urine:350]   Recent Labs  09/19/13 1710 09/20/13 0525 09/21/13 0455 09/22/13 0447  HGB 11.6* 10.2* 9.1* 8.1*    Recent Labs  09/21/13 0455 09/22/13 0447  WBC 8.7 7.0  RBC 2.96* 2.64*  HCT 26.4* 23.9*  PLT 135* 145*    Recent Labs  09/20/13 0525 09/21/13 0455  NA 138 140  K 3.9 3.9  CL 106 107  CO2 23 27  BUN 7 10  CREATININE 0.64 0.77  GLUCOSE 127* 123*  CALCIUM 8.4 8.7   No results found for this basename: LABPT, INR,  in the last 72 hours  Neurologically intact Neurovascular intact Intact pulses distally Dorsiflexion/Plantar flexion intact Compartment soft Some swelling  Assessment/Plan: 3 Days Post-Op Procedure(s) (LRB): RIGHT TOTAL HIP ARTHROPLASTY (Right) Advance diet Up with therapy Asx anemia no no hx CAD or CVA. Moniter HCT. Transfuse if Sx Check H/H in am  Alpha Mysliwiec C 09/22/2013, 8:11 AM

## 2013-09-22 NOTE — Progress Notes (Signed)
Physical Therapy Treatment Patient Details Name: [name] MRN: [id number] DOB: 08-Jul-1946 Today's Date: 09/22/2013 Time: 1025-1050 PT Time Calculation (min): 25 min  PT Assessment / Plan / Recommendation  History of Present Illness R Posterior THA   PT Comments   Pt able to progress hallway ambulation however still limited by pain.  Pt also requires verbal cues to maintain posterior hip precautions with mobility.  Pt with increase in hip pain during exercises so short rest break between exercises to decrease pain.  Pt hopeful to d/c to SNF today.   Follow Up Recommendations  SNF     Does the patient have the potential to tolerate intense rehabilitation     Barriers to Discharge        Equipment Recommendations  Rolling walker with 5" wheels    Recommendations for Other Services    Frequency 7X/week   Progress towards PT Goals Progress towards PT goals: Progressing toward goals  Plan Current plan remains appropriate    Precautions / Restrictions Precautions Precautions: Posterior Hip Precaution Comments: Pt recalled 1/3 THP so re educated again and demonstrated Restrictions Weight Bearing Restrictions: Yes RLE Weight Bearing: Partial weight bearing RLE Partial Weight Bearing Percentage or Pounds: 50   Pertinent Vitals/Pain 2/10 R hip pain at rest, repositioned    Mobility  Bed Mobility Bed Mobility: Not assessed Details for Bed Mobility Assistance: sitting EOB with nsg tech on arrival Transfers Transfers: Sit to Stand;Stand to Sit Sit to Stand: 4: Min assist;From bed;With upper extremity assist Stand to Sit: 4: Min guard;To chair/3-in-1;With upper extremity assist Details for Transfer Assistance: verbal cues for R LE forward for adhering to hip precaution and hand placement, assist to rise Ambulation/Gait Ambulation/Gait Assistance: 4: Min guard Ambulation Distance (Feet): 80 Feet Assistive device: Rolling walker Ambulation/Gait Assistance Details: pt  verbalized and demonstrated PWB status well today, verbal cues for RW distance, pt states pain in R hip better today Gait Pattern: Step-to pattern;Decreased stance time - right;Antalgic Gait velocity: decreased    Exercises Total Joint Exercises Ankle Circles/Pumps: AROM;Supine;15 reps;Both Quad Sets: AROM;Both;15 reps;Supine Gluteal Sets: AROM;Both;15 reps;Supine Heel Slides: AAROM;Right;Supine;15 reps;Other (comment) (within precautions) Hip ABduction/ADduction: AROM;Right;15 reps;Supine;Other (comment) (within precautions)   PT Diagnosis:    PT Problem List:   PT Treatment Interventions:     PT Goals (current goals can now be found in the care plan section)    Visit Information  Last PT Received On: 09/22/13 Assistance Needed: +1 History of Present Illness: R Posterior THA    Subjective Data      Cognition  Cognition Arousal/Alertness: Awake/alert Behavior During Therapy: WFL for tasks assessed/performed Overall Cognitive Status: Within Functional Limits for tasks assessed    Balance     End of Session PT - End of Session Activity Tolerance: Patient limited by pain;Patient limited by fatigue Patient left: with call bell/phone within reach;in chair   GP     [name],[name] 09/22/2013, 11:56 AM [name], PT, DPT 09/22/2013 Pager: [phone]

## 2013-09-23 LAB — TYPE AND SCREEN
ABO/RH(D): A POS
Antibody Screen: NEGATIVE
Unit division: 0
Unit division: 0
Unit division: 0
Unit division: 0

## 2013-09-23 LAB — CBC
Hemoglobin: 8.2 g/dL — ABNORMAL LOW (ref 12.0–15.0)
MCV: 91.8 fL (ref 78.0–100.0)
Platelets: 188 10*3/uL (ref 150–400)
RBC: 2.68 MIL/uL — ABNORMAL LOW (ref 3.87–5.11)
WBC: 5.6 10*3/uL (ref 4.0–10.5)

## 2013-09-23 NOTE — Progress Notes (Signed)
   Subjective: 4 Days Post-Op Procedure(s) (LRB): RIGHT TOTAL HIP ARTHROPLASTY (Right)  Pt doing well Mild pain to right hip today Patient reports pain as mild.  Objective:   VITALS:   Filed Vitals:   09/23/13 0600  BP: 116/73  Pulse: 70  Temp: 97.6 F (36.4 C)  Resp: 20    Right hip incision healing well nv intact distally No rashes or edema  LABS  Recent Labs  09/21/13 0455 09/22/13 0447 09/23/13 0512  HGB 9.1* 8.1* 8.2*  HCT 26.4* 23.9* 24.6*  WBC 8.7 7.0 5.6  PLT 135* 145* 188     Recent Labs  09/21/13 0455  NA 140  K 3.9  BUN 10  CREATININE 0.77  GLUCOSE 123*     Assessment/Plan: 4 Days Post-Op Procedure(s) (LRB): RIGHT TOTAL HIP ARTHROPLASTY (Right) Plan to d/c to snf today F/u in 2 weeks with Dr. Liana Gerold, MPAS, PA-C  09/23/2013, 7:52 AM

## 2013-09-23 NOTE — Progress Notes (Signed)
09/24/2103 1100 Dc to SNF.  No NCM needs identified. Isidoro Donning RN CCM Case Mgmt phone [phone]

## 2013-09-23 NOTE — Progress Notes (Signed)
Physical Therapy Treatment Patient Details Name: Morgaine Kimball MRN: [id number] DOB: [birth year] Today's Date: 09/23/2013 Time: 4098-1191 PT Time Calculation (min): 13 min  PT Assessment / Plan / Recommendation  History of Present Illness R Posterior THA   PT Comments   **Pt pleasant and motivated. Treatment time limited by arrival of ambulance to take pt to SNF. *  Follow Up Recommendations  SNF     Does the patient have the potential to tolerate intense rehabilitation     Barriers to Discharge        Equipment Recommendations  Rolling walker with 5" wheels    Recommendations for Other Services    Frequency 7X/week   Progress towards PT Goals Progress towards PT goals: Progressing toward goals  Plan Current plan remains appropriate    Precautions / Restrictions Precautions Precautions: Posterior Hip Precaution Comments: Pt recalled 2/3 THP so re educated again and demonstrated Restrictions Weight Bearing Restrictions: Yes RLE Weight Bearing: Partial weight bearing RLE Partial Weight Bearing Percentage or Pounds: 50%   Pertinent Vitals/Pain *3/10 R hip premedicated**    Mobility  Bed Mobility Supine to Sit: 4: Min assist Details for Bed Mobility Assistance: min A to support RLE Transfers Transfers: Sit to Stand;Stand to Sit Sit to Stand: From bed;With upper extremity assist;5: Supervision Stand to Sit: To chair/3-in-1;With upper extremity assist;5: Supervision Details for Transfer Assistance: verbal cues for R LE forward for adhering to hip precaution and hand placement Ambulation/Gait Ambulation/Gait Assistance: 5: Supervision Ambulation Distance (Feet): 80 Feet Assistive device: Rolling walker Gait Pattern: Step-to pattern;Decreased stance time - right;Antalgic Gait velocity: decreased General Gait Details: steady, no LOB    Exercises Total Joint Exercises Ankle Circles/Pumps: AROM;Supine;15 reps;Both Heel Slides: AAROM;Right;Supine;15 reps;Other  (comment) (within precautions) Hip ABduction/ADduction: AROM;Right;15 reps;Supine;Other (comment) (within precautions)   PT Diagnosis:    PT Problem List:   PT Treatment Interventions:     PT Goals (current goals can now be found in the care plan section) Acute Rehab PT Goals Patient Stated Goal: go to rehab  PT Goal Formulation: With patient Time For Goal Achievement: 09/27/13 Potential to Achieve Goals: Good  Visit Information  Last PT Received On: 09/23/13 Assistance Needed: +1 History of Present Illness: R Posterior THA    Subjective Data  Patient Stated Goal: go to rehab    Cognition  Cognition Arousal/Alertness: Awake/alert Behavior During Therapy: WFL for tasks assessed/performed Overall Cognitive Status: Within Functional Limits for tasks assessed    Balance     End of Session PT - End of Session Activity Tolerance: Patient tolerated treatment well Patient left: Other (comment) (ambulance stretcher for DC to SNF) Nurse Communication: Mobility status   GP     Ralene Bathe Kistler 09/23/2013, 11:44 AM [phone]

## 2013-09-23 NOTE — Progress Notes (Signed)
Discharged from floor via stretcher, EMT with pt. No changes in assessment. Tawana Pasch  

## 2013-09-24 ENCOUNTER — Non-Acute Institutional Stay: Payer: BC Managed Care – PPO | Admitting: Adult Health

## 2013-09-24 DIAGNOSIS — K5900 Constipation, unspecified: Secondary | ICD-10-CM

## 2013-09-24 DIAGNOSIS — D62 Acute posthemorrhagic anemia: Secondary | ICD-10-CM

## 2013-09-24 DIAGNOSIS — I10 Essential (primary) hypertension: Secondary | ICD-10-CM

## 2013-09-24 DIAGNOSIS — E039 Hypothyroidism, unspecified: Secondary | ICD-10-CM

## 2013-09-24 DIAGNOSIS — M169 Osteoarthritis of hip, unspecified: Secondary | ICD-10-CM

## 2013-09-24 DIAGNOSIS — M1611 Unilateral primary osteoarthritis, right hip: Secondary | ICD-10-CM

## 2013-09-24 DIAGNOSIS — J309 Allergic rhinitis, unspecified: Secondary | ICD-10-CM

## 2013-09-24 LAB — POCT I-STAT 4, (NA,K, GLUC, HGB,HCT)
Glucose, Bld: 106 mg/dL — ABNORMAL HIGH (ref 70–99)
HCT: 26 % — ABNORMAL LOW (ref 36.0–46.0)
Hemoglobin: 8.8 g/dL — ABNORMAL LOW (ref 12.0–15.0)
Potassium: 3.2 mEq/L — ABNORMAL LOW (ref 3.5–5.1)
Sodium: 142 mEq/L (ref 135–145)

## 2013-09-24 NOTE — Progress Notes (Signed)
Clinical Social Work Department CLINICAL SOCIAL WORK PLACEMENT NOTE 09/24/2013  Patient:  [name], [name]  Account Number:  [id number] Admit date:  09/19/2013  Clinical Social Worker:  Cori Razor, LCSW  Date/time:  09/20/2013 01:47 PM  Clinical Social Work is seeking post-discharge placement for this patient at the following level of care:   SKILLED NURSING   (*CSW will update this form in Epic as items are completed)   09/20/2013  Patient/family provided with [facility] Department of Clinical Social Work's list of facilities offering this level of care within the geographic area requested by the patient (or if unable, by the patient's family).  09/20/2013  Patient/family informed of their freedom to choose among providers that offer the needed level of care, that participate in Medicare, Medicaid or managed care program needed by the patient, have an available bed and are willing to accept the patient.    Patient/family informed of [facility]' ownership interest in [facility], as well as of the fact that they are under no obligation to receive care at this facility.  PASARR submitted to EDS on 09/19/2013 PASARR number received from [employer] on 09/19/2013  FL2 transmitted to all facilities in geographic area requested by pt/family on  09/20/2013 FL2 transmitted to all facilities within larger geographic area on   Patient informed that his/her managed care company has contracts with or will negotiate with  certain facilities, including the following:     Patient/family informed of bed offers received:  09/20/2013 Patient chooses bed at [facility] Physician recommends and patient chooses bed at    Patient to be transferred to [facility] on  09/23/2013 Patient to be transferred to facility by EMS  The following physician request were entered in Epic:   Additional Comments: [employer] auth received prior to d/c.  Cori Razor LCSW [phone]

## 2013-09-25 DIAGNOSIS — K5900 Constipation, unspecified: Secondary | ICD-10-CM | POA: Insufficient documentation

## 2013-09-25 NOTE — Progress Notes (Signed)
Patient ID: [name], female   DOB: 1946-08-21, 67 y.o.   MRN: [id number]                         PROGRESS NOTE  DATE: 09/24/2013  FACILITY: Nursing Home Location: [facility]  LEVEL OF CARE: SNF (31)  Acute Visit  CHIEF COMPLAINT:  Follow-up hospitalization  HISTORY OF PRESENT ILLNESS: This is a 67 year old female who has been admitted to [facility] on 09/23/13 from [facility] with Osteoarthritis S/P right total hip arthroplasty. She has been admitted for a short-term rehabilitation.  REASSESSMENT OF ONGOING PROBLEM(S):  HTN: Pt 's HTN remains stable.  Denies CP, sob, DOE, pedal edema, headaches, dizziness or visual disturbances.  No complications from the medications currently being used.  Last BP : 120/78  ALLERGIC RHINITIS: Allergic rhinitis remains stable.  Patient denies ongoing symptoms such as runny nose sneezing or tearing. No complications reported from the current medication(s) being used.  ANEMIA: The anemia has been stable. The patient denies fatigue, melena or hematochezia. No complications from the medications currently being used. 12/14 hgb 9.1  PAST MEDICAL HISTORY : Reviewed.  No changes.  CURRENT MEDICATIONS: Reviewed per [facility]  REVIEW OF SYSTEMS:  GENERAL: no change in appetite, no fatigue, no weight changes, no fever, chills or weakness RESPIRATORY: no cough, SOB, DOE, wheezing, hemoptysis CARDIAC: no chest pain, edema or palpitations GI: no abdominal pain, diarrhea, constipation, heart burn, nausea or vomiting  PHYSICAL EXAMINATION  VS:  T97.4       P80      RR18      BP120/78     POX97 %     WT192.2 (Lb)  GENERAL: no acute distress, normal body habitus EYES: conjunctivae normal, sclerae normal, normal eye lids NECK: supple, trachea midline, no neck masses, no thyroid tenderness, no thyromegaly LYMPHATICS: no LAN in the neck, no supraclavicular LAN RESPIRATORY: breathing is even & unlabored, BS CTAB CARDIAC: RRR, no  murmur,no extra heart sounds, no edema GI: abdomen soft, normal BS, no masses, no tenderness, no hepatomegaly, no splenomegaly PSYCHIATRIC: the patient is alert & oriented to person, affect & behavior appropriate  LABS/RADIOLOGY: 09/21/13 WBC 8.7 hemoglobin 9.1 hematocrit 36.0 09/20/13 hemoglobin 10.2 hematocrit 29.6  WBC 8.9 sodium 138 potassium 3.9 glucose 127 BUN 7 creatinine 0.64 calcium 8.4   ASSESSMENT/PLAN:  Osteoarthritis  status post right total hip arthroplasty - for rehabilitation  Anemia - continue ferrous sulfate  Allergic rhinitis - stable; continue Allegra  Hypertension - well controlled; continue lisinopril  Constipation -  Continue MiraLax  Hypothyroidism - continue Thyroid   CPT CODE: 40981

## 2013-09-26 ENCOUNTER — Non-Acute Institutional Stay (SKILLED_NURSING_FACILITY): Payer: BC Managed Care – PPO | Admitting: Internal Medicine

## 2013-09-26 DIAGNOSIS — J45909 Unspecified asthma, uncomplicated: Secondary | ICD-10-CM

## 2013-09-26 DIAGNOSIS — M1611 Unilateral primary osteoarthritis, right hip: Secondary | ICD-10-CM

## 2013-09-26 DIAGNOSIS — E039 Hypothyroidism, unspecified: Secondary | ICD-10-CM

## 2013-09-26 DIAGNOSIS — M169 Osteoarthritis of hip, unspecified: Secondary | ICD-10-CM

## 2013-09-26 DIAGNOSIS — D62 Acute posthemorrhagic anemia: Secondary | ICD-10-CM

## 2013-09-30 ENCOUNTER — Encounter: Payer: Self-pay | Admitting: Internal Medicine

## 2013-09-30 NOTE — Progress Notes (Signed)
HISTORY & PHYSICAL  DATE: 09/26/2013   FACILITY: [facility]  LEVEL OF CARE: SNF (31)  ALLERGIES:  Allergies  Allergen Reactions  . Codeine   . Prednisone Other (See Comments)    Causes more pain    CHIEF COMPLAINT:  Manage right hip osteoarthritis, hypothyroidism and asthma  HISTORY OF PRESENT ILLNESS: Patient is a 67 year old Caucasian female.  HIP OSTEOARTHRITIS: patient had advanced end stage OA of the hip with progressively worsening pain & dysfunction.  Pt failed non-surgical conservative management.  Therefore pt underwent total hip arthroplasty & tolerated the procedure well.  Pt denies hip pain currently.  Pt was admitted to this facility for short term rehabilitation. She had a lead and self-pay the articular osteophytes and joint space narrowing by imaging studies.  ASTHMA: The patient's asthma remains stable. Patient denies shortness of breath, dyspnea on exertion or wheezing. No complications reported from the medications currently being used.  HYPOTHYROIDISM: The hypothyroidism remains stable. No complications noted from the medications presently being used.  The patient denies fatigue or constipation.  Last TSH not available.  PAST MEDICAL HISTORY :  Past Medical History  Diagnosis Date  . Arthritis   . Seasonal allergies   . Hx of blood clots 1967    from birth control in LUE  . UTI (urinary tract infection)   . Hypertension   . Hypothyroidism   . Asthma     mild  . H/O bronchitis   . Pneumonia     hx of  . Hypoglycemia   . Migraine     from food  . Sleep apnea     PAST SURGICAL HISTORY: Past Surgical History  Procedure Laterality Date  . Detatched retina  2003    rt eye  . Cataract extraction      rt eye  . Colonoscopy    . Total hip arthroplasty Right 09/19/2013    Procedure: RIGHT TOTAL HIP ARTHROPLASTY;  Surgeon: Jacki Cones, MD;  Location: [facility];  Service: Orthopedics;  Laterality: Right;    SOCIAL  HISTORY:  reports that she quit smoking about 38 years ago. Her smoking use included Cigarettes. She has a 39 pack-year smoking history. She has never used smokeless tobacco. She reports that she does not drink alcohol or use illicit drugs.  FAMILY HISTORY:  Family History  Problem Relation Age of Onset  . Rheum arthritis Mother   . Rheum arthritis Father   . Rheum arthritis Maternal Grandmother   . Rheum arthritis Maternal Grandfather   . Rheum arthritis Paternal Grandmother     CURRENT MEDICATIONS: Reviewed per MAR  REVIEW OF SYSTEMS:  See HPI otherwise 14 point ROS is negative.  PHYSICAL EXAMINATION  VS:  T 99.3       P 72       RR 18       BP 108/54        GENERAL: no acute distress, normal body habitus EYES: conjunctivae normal, sclerae normal, normal eye lids MOUTH/THROAT: lips without lesions,no lesions in the mouth,tongue is without lesions,uvula elevates in midline NECK: supple, trachea midline, no neck masses, no thyroid tenderness, no thyromegaly LYMPHATICS: no LAN in the neck, no supraclavicular LAN RESPIRATORY: breathing is even & unlabored, BS CTAB CARDIAC: RRR, no murmur,no extra heart sounds, no edema GI:  ABDOMEN: abdomen soft, normal BS, no masses, no tenderness  LIVER/SPLEEN: no hepatomegaly, no splenomegaly MUSCULOSKELETAL: HEAD: normal to inspection & palpation BACK: no kyphosis, scoliosis or spinal processes  tenderness EXTREMITIES: LEFT UPPER EXTREMITY: full range of motion, normal strength & tone RIGHT UPPER EXTREMITY:  full range of motion, normal strength & tone LEFT LOWER EXTREMITY:  full range of motion, normal strength & tone RIGHT LOWER EXTREMITY:   range of motion not tested due to surgery, normal strength & tone PSYCHIATRIC: the patient is alert & oriented to person, affect & behavior appropriate  LABS/RADIOLOGY:  Labs reviewed: Basic Metabolic Panel:  Recent Labs  16/10/96 1525 09/19/13 1544 09/20/13 0525 09/21/13 0455  NA 138 142  138 140  K 4.0 3.2* 3.9 3.9  CL 103  --  106 107  CO2 27  --  23 27  GLUCOSE 91 106* 127* 123*  BUN 14  --  7 10  CREATININE 0.70  --  0.64 0.77  CALCIUM 9.8  --  8.4 8.7   Liver Function Tests:  Recent Labs  07/06/13 0922 09/11/13 1525  AST 21 21  ALT 14 14  ALKPHOS 58 71  BILITOT 0.6 0.3  PROT 6.7 7.1  ALBUMIN 3.8 3.7   CBC:  Recent Labs  07/06/13 0922  09/21/13 0455 09/22/13 0447 09/23/13 0512  WBC 4.6  < > 8.7 7.0 5.6  NEUTROABS 3.1  --   --   --   --   HGB 12.5  < > 9.1* 8.1* 8.2*  HCT 36.6  < > 26.4* 23.9* 24.6*  MCV 90.3  < > 89.2 90.5 91.8  PLT 244.0  < > 135* 145* 188  < > = values in this interval not displayed.  Lipid Panel:  Recent Labs  07/06/13 0922  HDL 59.90   CBG:  Recent Labs  09/19/13 1700  GLUCAP 183*   Urinalysis negative Chest x-ray-no acute disease Right hip x-ray-right hip arthroplasty without complicating features  ASSESSMENT/PLAN:  Right hip osteoarthritis-status post right total hip arthroplasty. Continue rehabilitation. Asthma-well compensated Hypothyroidism-continue levothyroxine Acute blood loss anemia-iron was started Hypertension-well-controlled Allergic rhinitis-well controlled  I have reviewed patient's medical records received at admission/from hospitalization.  CPT CODE: 04540

## 2013-10-02 ENCOUNTER — Non-Acute Institutional Stay (SKILLED_NURSING_FACILITY): Payer: BC Managed Care – PPO | Admitting: Adult Health

## 2013-10-02 DIAGNOSIS — K5900 Constipation, unspecified: Secondary | ICD-10-CM

## 2013-10-02 DIAGNOSIS — E039 Hypothyroidism, unspecified: Secondary | ICD-10-CM

## 2013-10-02 DIAGNOSIS — J309 Allergic rhinitis, unspecified: Secondary | ICD-10-CM

## 2013-10-02 DIAGNOSIS — M169 Osteoarthritis of hip, unspecified: Secondary | ICD-10-CM

## 2013-10-02 DIAGNOSIS — I10 Essential (primary) hypertension: Secondary | ICD-10-CM

## 2013-10-02 DIAGNOSIS — D62 Acute posthemorrhagic anemia: Secondary | ICD-10-CM

## 2013-10-02 DIAGNOSIS — M1611 Unilateral primary osteoarthritis, right hip: Secondary | ICD-10-CM

## 2013-10-10 NOTE — Progress Notes (Signed)
Patient ID: [name], female   DOB: [birth year], 67 y.o.   MRN: [id number]                     PROGRESS NOTE  DATE: 10/02/2013   FACILITY: [facility]  LEVEL OF CARE: SNF (31)  Acute Visit  CHIEF COMPLAINT:  Discharge Notes  HISTORY OF PRESENT ILLNESS: This is a 67 year old female who is for discharge home with Home health PT, OT and Nursing. DME: Rolling walker and Bedside commode. She has been admitted to [facility] on 09/23/13 from [facility] with Osteoarthritis S/P right total hip arthroplasty.  Patient was admitted to this facility for short-term rehabilitation after the patient's recent hospitalization.  Patient has completed SNF rehabilitation and therapy has cleared the patient for discharge.  Reassessment of ongoing problem(s):  HTN: Pt 's HTN remains stable.  Denies CP, sob, DOE, pedal edema, headaches, dizziness or visual disturbances.  No complications from the medications currently being used.  Last BP : 118/67  HYPOTHYROIDISM: The hypothyroidism remains stable. No complications noted from the medications presently being used.  The patient denies fatigue or constipation.    CONSTIPATION: The constipation remains stable. No complications from the medications presently being used. Patient denies ongoing constipation, abdominal pain, nausea or vomiting.  PAST MEDICAL HISTORY : Reviewed.  No changes.  CURRENT MEDICATIONS: Reviewed per [facility]  REVIEW OF SYSTEMS:  GENERAL: no change in appetite, no fatigue, no weight changes, no fever, chills or weakness RESPIRATORY: no cough, SOB, DOE, wheezing, hemoptysis CARDIAC: no chest pain, edema or palpitations GI: no abdominal pain, diarrhea, constipation, heart burn, nausea or vomiting  PHYSICAL EXAMINATION  VS:  T97.2       P94       RR20      BP118/67            WT190.2 (Lb)  GENERAL: no acute distress, normal body habitus NECK: supple, trachea midline, no neck masses, no thyroid tenderness,  no thyromegaly LYMPHATICS: no LAN in the neck, no supraclavicular LAN RESPIRATORY: breathing is even & unlabored, BS CTAB CARDIAC: RRR, no murmur,no extra heart sounds, no edema GI: abdomen soft, normal BS, no masses, no tenderness, no hepatomegaly, no splenomegaly PSYCHIATRIC: the patient is alert & oriented to person, affect & behavior appropriate  LABS/RADIOLOGY: 09/21/13 WBC 8.7 hemoglobin 9.1 hematocrit 36.0 09/20/13 hemoglobin 10.2 hematocrit 29.6  WBC 8.9 sodium 138 potassium 3.9 glucose 127 BUN 7 creatinine 0.64 calcium 8.4    ASSESSMENT/PLAN:  Osteoarthritis  status post right total hip arthroplasty - for Home health PT, OT and Nursing  Anemia - continue ferrous sulfate  Allergic rhinitis - stable; continue Allegra  Hypertension - well controlled; continue lisinopril  Constipation -  Continue MiraLax  Hypothyroidism - continue Thyroid  I have filled out patient's discharge paperwork and written prescriptions.  Patient will receive home health PT, OT and Nursing.  DME provided: Rolling walker and Bedside commode  Total discharge time: Greater than 30 minutes Discharge time involved coordination of the discharge process with Child psychotherapist, nursing staff and therapy department. Medical justification for home health services/DME verified.   CPT CODE: 11914

## 2013-10-22 ENCOUNTER — Other Ambulatory Visit: Payer: Self-pay | Admitting: Family Medicine

## 2013-10-23 NOTE — Telephone Encounter (Signed)
Last OV 07-06-13 Med filled 07-06-13 #60 with 0  Ok for pt to still be on?

## 2013-11-12 ENCOUNTER — Encounter: Payer: Self-pay | Admitting: Family Medicine

## 2014-02-25 ENCOUNTER — Other Ambulatory Visit: Payer: Self-pay | Admitting: Family Medicine

## 2014-02-25 NOTE — Telephone Encounter (Signed)
Med filled.  

## 2014-03-13 ENCOUNTER — Other Ambulatory Visit: Payer: Self-pay | Admitting: General Practice

## 2014-03-13 ENCOUNTER — Ambulatory Visit (INDEPENDENT_AMBULATORY_CARE_PROVIDER_SITE_OTHER): Payer: BC Managed Care – PPO | Admitting: Family Medicine

## 2014-03-13 ENCOUNTER — Encounter: Payer: Self-pay | Admitting: Family Medicine

## 2014-03-13 VITALS — BP 138/80 | HR 65 | Temp 98.2°F | Resp 16 | Wt 191.1 lb

## 2014-03-13 DIAGNOSIS — I10 Essential (primary) hypertension: Secondary | ICD-10-CM

## 2014-03-13 DIAGNOSIS — E039 Hypothyroidism, unspecified: Secondary | ICD-10-CM

## 2014-03-13 LAB — BASIC METABOLIC PANEL
BUN: 21 mg/dL (ref 6–23)
CHLORIDE: 106 meq/L (ref 96–112)
CO2: 27 meq/L (ref 19–32)
Calcium: 9.5 mg/dL (ref 8.4–10.5)
Creatinine, Ser: 0.9 mg/dL (ref 0.4–1.2)
GFR: 66.84 mL/min (ref >60.00)
Glucose, Bld: 93 mg/dL (ref 70–99)
POTASSIUM: 4.2 meq/L (ref 3.5–5.1)
SODIUM: 140 meq/L (ref 135–145)

## 2014-03-13 LAB — TSH: TSH: 0.05 u[IU]/mL — ABNORMAL LOW (ref 0.35–4.50)

## 2014-03-13 MED ORDER — THYROID 120 MG PO TABS: 120.0000 mg | ORAL_TABLET | Freq: Every day | ORAL | Status: DC

## 2014-03-13 NOTE — Patient Instructions (Signed)
Schedule your complete physical for September We'll notify you of your lab results and make any changes if needed Keep up the good work!  You look great!! Happy Belated Birthday!!!

## 2014-03-13 NOTE — Assessment & Plan Note (Signed)
Chronic problem, currently doing well.  Asymptomatic.  Check TSH.  Adjust meds prn.

## 2014-03-13 NOTE — Progress Notes (Signed)
   Subjective:    Patient ID: [name], female    DOB: [birth year], 68 y.o.   MRN: [id number]  HPI HTN- chronic problem, pt reports high salt intake yesterday.  Adequate control on Lisinopril 20mg .  No CP, SOB, HAs, visual changes, edema.  Hypothyroid- chronic problem, reports 'things are good'.  No longer losing hair, fingernails are stronger.  No constipation.  No excessive fatigue.   Review of Systems For ROS see HPI     Objective:   Physical Exam  Vitals reviewed. Constitutional: She is oriented to person, place, and time. She appears well-developed and well-nourished. No distress.  HENT:  Head: Normocephalic and atraumatic.  Eyes: Conjunctivae and EOM are normal. Pupils are equal, round, and reactive to light.  Neck: Normal range of motion. Neck supple. No thyromegaly present.  Cardiovascular: Normal rate, regular rhythm, normal heart sounds and intact distal pulses.   No murmur heard. Pulmonary/Chest: Effort normal and breath sounds normal. No respiratory distress.  Abdominal: Soft. She exhibits no distension. There is no tenderness.  Musculoskeletal: She exhibits no edema.  Lymphadenopathy:    She has no cervical adenopathy.  Neurological: She is alert and oriented to person, place, and time.  Skin: Skin is warm and dry.  Psychiatric: She has a normal mood and affect. Her behavior is normal.          Assessment & Plan:

## 2014-03-13 NOTE — Assessment & Plan Note (Signed)
Chronic problem, adequate control.  Asymptomatic.  Check labs.  No anticipated med changes. 

## 2014-03-13 NOTE — Progress Notes (Signed)
Pre visit review using our clinic review tool, if applicable. No additional management support is needed unless otherwise documented below in the visit note. 

## 2014-03-14 ENCOUNTER — Telehealth: Payer: Self-pay | Admitting: Family Medicine

## 2014-03-14 NOTE — Telephone Encounter (Signed)
Relevant patient education mailed to patient.  

## 2014-04-07 ENCOUNTER — Other Ambulatory Visit: Payer: Self-pay | Admitting: Family Medicine

## 2014-04-08 NOTE — Telephone Encounter (Signed)
Med filled.  

## 2014-07-17 ENCOUNTER — Telehealth: Payer: Self-pay | Admitting: Family Medicine

## 2014-07-17 DIAGNOSIS — Z780 Asymptomatic menopausal state: Secondary | ICD-10-CM

## 2014-07-17 NOTE — Telephone Encounter (Signed)
Pt.notified

## 2014-07-17 NOTE — Telephone Encounter (Signed)
Order entered

## 2014-07-17 NOTE — Telephone Encounter (Signed)
Caller name: [name] Relation to pt: self Call back number: [phone] Pharmacy:  Reason for call:   Patient states that Teola Bradley is telling her that it is time to have a bone density done and needs an order for this.

## 2014-08-01 LAB — HM MAMMOGRAPHY

## 2014-08-01 LAB — HM DEXA SCAN: HM Dexa Scan: NORMAL

## 2014-08-06 ENCOUNTER — Ambulatory Visit (INDEPENDENT_AMBULATORY_CARE_PROVIDER_SITE_OTHER): Payer: BC Managed Care – PPO | Admitting: *Deleted

## 2014-08-06 DIAGNOSIS — Z23 Encounter for immunization: Secondary | ICD-10-CM

## 2014-08-13 ENCOUNTER — Encounter: Payer: Self-pay | Admitting: General Practice

## 2014-09-02 ENCOUNTER — Encounter: Payer: Self-pay | Admitting: Family Medicine

## 2014-09-10 ENCOUNTER — Encounter: Payer: Self-pay | Admitting: General Practice

## 2014-09-13 ENCOUNTER — Encounter: Payer: Self-pay | Admitting: Family Medicine

## 2014-09-13 ENCOUNTER — Ambulatory Visit (INDEPENDENT_AMBULATORY_CARE_PROVIDER_SITE_OTHER): Payer: BC Managed Care – PPO | Admitting: Family Medicine

## 2014-09-13 VITALS — BP 120/80 | HR 83 | Temp 97.7°F | Resp 17 | Wt 193.4 lb

## 2014-09-13 DIAGNOSIS — J0100 Acute maxillary sinusitis, unspecified: Secondary | ICD-10-CM

## 2014-09-13 MED ORDER — AMOXICILLIN 875 MG PO TABS: 875.0000 mg | ORAL_TABLET | Freq: Two times a day (BID) | ORAL | Status: DC

## 2014-09-13 NOTE — Progress Notes (Signed)
   Subjective:    Patient ID: [name], female    DOB: 09/19/1946, 68 y.o.   MRN: [id number]  HPI URI- sxs started 2-3 days w/ ear fullness and dizziness upon sitting up.  Now w/ nasal congestion.  + maxillary pain.  No tooth pain.  No fevers.  No N/V.  + PND and cough.  +sick contacts.   Review of Systems For ROS see HPI     Objective:   Physical Exam  Constitutional: She appears well-developed and well-nourished. No distress.  HENT:  Head: Normocephalic and atraumatic.  Right Ear: Tympanic membrane normal.  Left Ear: Tympanic membrane normal.  Nose: Mucosal edema and rhinorrhea present. Right sinus exhibits maxillary sinus tenderness. Right sinus exhibits no frontal sinus tenderness. Left sinus exhibits maxillary sinus tenderness. Left sinus exhibits no frontal sinus tenderness.  Mouth/Throat: Uvula is midline and mucous membranes are normal. Posterior oropharyngeal erythema present. No oropharyngeal exudate.  Eyes: Conjunctivae and EOM are normal. Pupils are equal, round, and reactive to light.  Neck: Normal range of motion. Neck supple.  Cardiovascular: Normal rate, regular rhythm and normal heart sounds.   Pulmonary/Chest: Effort normal and breath sounds normal. No respiratory distress. She has no wheezes.  Lymphadenopathy:    She has no cervical adenopathy.  Vitals reviewed.         Assessment & Plan:

## 2014-09-13 NOTE — Progress Notes (Signed)
Pre visit review using our clinic review tool, if applicable. No additional management support is needed unless otherwise documented below in the visit note. 

## 2014-09-13 NOTE — Patient Instructions (Signed)
Follow up as scheduled Start the Amoxicillin twice daily- take w/ food Drink plenty of fluids REST! Mucinex DM for cough/congestion Call with any questions or concerns HAPPY RETIREMENT! Merry Christmas!

## 2014-09-15 NOTE — Assessment & Plan Note (Signed)
Pt's sxs and PE consistent w/ infxn.  Start abx.  Reviewed supportive care and red flags that should prompt return.  Pt expressed understanding and is in agreement w/ plan.  

## 2014-09-22 ENCOUNTER — Other Ambulatory Visit: Payer: Self-pay | Admitting: Family Medicine

## 2014-09-23 NOTE — Telephone Encounter (Signed)
Med filled.  

## 2014-10-18 ENCOUNTER — Telehealth: Payer: Self-pay | Admitting: Family Medicine

## 2014-10-18 NOTE — Telephone Encounter (Signed)
Prior authorization initiated. Awaiting determination. JG//CMA

## 2014-10-18 NOTE — Telephone Encounter (Signed)
Sarah from [place] called and needs callback on this. She needs clarification. Best # [phone]

## 2014-10-18 NOTE — Telephone Encounter (Signed)
Prior authorization approved effective 10/11/2014 through 10/11/2015. JG//CMA

## 2014-10-18 NOTE — Telephone Encounter (Signed)
Caller name: Sander, Speckman Relation to pt: self  Call back number: [phone]   Reason for call:  In need of pre-cert for thyroid (ARMOUR) 120 MG tablet. pt has new Haematologist member ID #[id number] Group ID [id number]    thyroid (ARMOUR) 120 MG tablet Pt states she will run out of medication by Tuesday and was unaware she needed a pre-cert. [employer] [phone]

## 2014-11-14 ENCOUNTER — Encounter: Payer: Self-pay | Admitting: Family Medicine

## 2014-11-14 ENCOUNTER — Ambulatory Visit (INDEPENDENT_AMBULATORY_CARE_PROVIDER_SITE_OTHER): Payer: Medicare HMO | Admitting: Family Medicine

## 2014-11-14 VITALS — BP 120/68 | HR 73 | Temp 97.9°F | Resp 16 | Ht 64.5 in | Wt 190.5 lb

## 2014-11-14 DIAGNOSIS — Z79899 Other long term (current) drug therapy: Secondary | ICD-10-CM

## 2014-11-14 DIAGNOSIS — E038 Other specified hypothyroidism: Secondary | ICD-10-CM

## 2014-11-14 DIAGNOSIS — Z23 Encounter for immunization: Secondary | ICD-10-CM

## 2014-11-14 DIAGNOSIS — I10 Essential (primary) hypertension: Secondary | ICD-10-CM

## 2014-11-14 DIAGNOSIS — Z0000 Encounter for general adult medical examination without abnormal findings: Secondary | ICD-10-CM

## 2014-11-14 DIAGNOSIS — L609 Nail disorder, unspecified: Secondary | ICD-10-CM

## 2014-11-14 DIAGNOSIS — L608 Other nail disorders: Secondary | ICD-10-CM

## 2014-11-14 LAB — HEPATIC FUNCTION PANEL
ALBUMIN: 4 g/dL (ref 3.5–5.2)
ALK PHOS: 72 U/L (ref 39–117)
ALT: 10 U/L (ref 0–35)
AST: 18 U/L (ref 0–37)
Bilirubin, Direct: 0.1 mg/dL (ref 0.0–0.3)
Total Bilirubin: 0.5 mg/dL (ref 0.2–1.2)
Total Protein: 6.8 g/dL (ref 6.0–8.3)

## 2014-11-14 LAB — BASIC METABOLIC PANEL
BUN: 15 mg/dL (ref 6–23)
CO2: 27 mEq/L (ref 19–32)
Calcium: 9.8 mg/dL (ref 8.4–10.5)
Chloride: 106 mEq/L (ref 96–112)
Creatinine, Ser: 0.75 mg/dL (ref 0.40–1.20)
GFR: 81.27 mL/min (ref >60.00)
Glucose, Bld: 90 mg/dL (ref 70–99)
Potassium: 4 mEq/L (ref 3.5–5.1)
Sodium: 139 mEq/L (ref 135–145)

## 2014-11-14 LAB — CBC WITH DIFFERENTIAL/PLATELET
BASOS ABS: 0 10*3/uL (ref 0.0–0.1)
BASOS PCT: 0.3 % (ref 0.0–3.0)
Eosinophils Absolute: 0.1 10*3/uL (ref 0.0–0.7)
Eosinophils Relative: 1.9 % (ref 0.0–5.0)
HEMATOCRIT: 38.1 % (ref 36.0–46.0)
Hemoglobin: 12.8 g/dL (ref 12.0–15.0)
Lymphocytes Relative: 21.3 % (ref 12.0–46.0)
Lymphs Abs: 1 10*3/uL (ref 0.7–4.0)
MCHC: 33.6 g/dL (ref 30.0–36.0)
MCV: 88.9 fl (ref 78.0–100.0)
Monocytes Absolute: 0.4 10*3/uL (ref 0.1–1.0)
Monocytes Relative: 8.3 % (ref 3.0–12.0)
NEUTROS PCT: 68.2 % (ref 43.0–77.0)
Neutro Abs: 3.2 10*3/uL (ref 1.4–7.7)
Platelets: 229 10*3/uL (ref 150.0–400.0)
RBC: 4.29 Mil/uL (ref 3.87–5.11)
RDW: 13.5 % (ref 11.5–15.5)
WBC: 4.7 10*3/uL (ref 4.0–10.5)

## 2014-11-14 LAB — TSH: TSH: 0.06 u[IU]/mL — ABNORMAL LOW (ref 0.35–4.50)

## 2014-11-14 LAB — LIPID PANEL
CHOL/HDL RATIO: 2
Cholesterol: 176 mg/dL (ref 0–200)
HDL: 79.8 mg/dL (ref >39.00)
LDL Cholesterol: 63 mg/dL (ref 0–99)
NonHDL: 96.2
Triglycerides: 164 mg/dL — ABNORMAL HIGH (ref 0.0–149.0)
VLDL: 32.8 mg/dL (ref 0.0–40.0)

## 2014-11-14 LAB — VITAMIN D 25 HYDROXY (VIT D DEFICIENCY, FRACTURES): VITD: 13.88 ng/mL — ABNORMAL LOW (ref 30.00–100.00)

## 2014-11-14 NOTE — Progress Notes (Signed)
Pre visit review using our clinic review tool, if applicable. No additional management support is needed unless otherwise documented below in the visit note. 

## 2014-11-14 NOTE — Assessment & Plan Note (Signed)
Pt's PE WNL w/ exception of being overweight and R great toe nail which is thickened w/ fungal infection and avulsing from the nail bed.  Refer to podiatry for removal.  UTD on colonoscopy, mammo, DEXA.  Written screening schedule updated and given to pt.  Check labs.  Anticipatory guidance provided.

## 2014-11-14 NOTE — Assessment & Plan Note (Signed)
Chronic problem.  Well controlled.  Asymptomatic.  Check labs.  No anticipated med changes. 

## 2014-11-14 NOTE — Progress Notes (Signed)
   Subjective:    Patient ID: [name], female    DOB: [birth year], 69 y.o.   MRN: [id number]  HPI Here today for CPE.  Risk Factors: HTN- chronic problem, on Lisinopril.  Compliant w/ meds.  Denies CP, SOB, HAs, visual changes, edema. Hypothyroid- chronic problem, on Armour thyroid.  Excellent compliance w/ meds.  Denies fatigue, changes to skin/hair/nails, change in bowel habits. [facility] Risk: low Depression: denies current sxs Hearing: normal to conversational tones and whispered voice at 6 ft ADL's: independent Cognitive: normal linear thought process, memory and attention intact Home Safety: safe at home Height, Weight, BMI, Visual Acuity: see vitals, vision corrected to 20/20 w/ glasses Counseling: UTD on colonoscopy, mammo, DEXA.  No need for paps. Healthcare POA/Living Will: doesn't have one currently but plans to set one up this year. Labs Ordered: See A&P Care Plan: See A&P    Review of Systems Patient reports no vision/ hearing changes, adenopathy,fever, weight change,  persistant/recurrent hoarseness , swallowing issues, chest pain, palpitations, edema, persistant/recurrent cough, hemoptysis, dyspnea (rest/exertional/paroxysmal nocturnal), gastrointestinal bleeding (melena, rectal bleeding), abdominal pain, significant heartburn, bowel changes, GU symptoms (dysuria, hematuria, incontinence), Gyn symptoms (abnormal  bleeding, pain),  syncope, focal weakness, memory loss, numbness & tingling, skin/hair/nail changes, abnormal bruising or bleeding, anxiety, or depression.   Reviewed meds, allergies, problem list, and PMH in chart     Objective:   Physical Exam General Appearance:    Alert, cooperative, no distress, appears stated age, overweight  Head:    Normocephalic, without obvious abnormality, atraumatic  Eyes:    PERRL, conjunctiva/corneas clear, EOM's intact, fundi    benign, both eyes  Ears:    Normal TM's and external  ear canals, both ears  Nose:   Nares normal, septum midline, mucosa normal, no drainage    or sinus tenderness  Throat:   Lips, mucosa, and tongue normal; teeth and gums normal  Neck:   Supple, symmetrical, trachea midline, no adenopathy;    Thyroid: no enlargement/tenderness/nodules  Back:     Symmetric, no curvature, ROM normal, no CVA tenderness  Lungs:     Clear to auscultation bilaterally, respirations unlabored  Chest Wall:    No tenderness or deformity   Heart:    Regular rate and rhythm, S1 and S2 normal, no murmur, rub   or gallop  Breast Exam:    Deferred to mammo  Abdomen:     Soft, non-tender, bowel sounds active all four quadrants,    no masses, no organomegaly  Genitalia:    Deferred  Rectal:    Extremities:   Extremities normal, atraumatic, no cyanosis or edema  Pulses:   2+ and symmetric all extremities  Skin:   Skin color, texture, turgor normal, eczematous patch on L upper chest  Lymph nodes:   Cervical, supraclavicular, and axillary nodes normal  Neurologic:   CNII-XII intact, normal strength, sensation and reflexes    throughout          Assessment & Plan:

## 2014-11-14 NOTE — Addendum Note (Signed)
Addended by: Kris Hartmann on: 11/14/2014 09:47 AM   Modules accepted: Orders

## 2014-11-14 NOTE — Assessment & Plan Note (Signed)
Chronic problem.  Asymptomatic.  Check labs.  Adjust meds prn  

## 2014-11-14 NOTE — Patient Instructions (Signed)
Follow up in 6 months to recheck BP We'll notify you of your lab results and make any changes if needed We'll call you with your podiatry appt Apply the Triamcinolone ointment on the itchy areas twice daily as needed Keep up the good work on healthy diet and regular exercise Call with any questions or concerns HAPPY RETIREMENT!!!

## 2014-11-15 ENCOUNTER — Telehealth: Payer: Self-pay | Admitting: Family Medicine

## 2014-11-15 ENCOUNTER — Other Ambulatory Visit: Payer: Self-pay | Admitting: General Practice

## 2014-11-15 MED ORDER — THYROID 90 MG PO TABS: 90.0000 mg | ORAL_TABLET | Freq: Every day | ORAL | Status: DC

## 2014-11-15 MED ORDER — VITAMIN D (ERGOCALCIFEROL) 1.25 MG (50000 UNIT) PO CAPS: 50000.0000 [IU] | ORAL_CAPSULE | ORAL | Status: DC

## 2014-11-15 MED ORDER — TRIAMCINOLONE ACETONIDE 0.1 % EX OINT: 1.0000 "application " | TOPICAL_OINTMENT | Freq: Two times a day (BID) | CUTANEOUS | Status: DC

## 2014-11-15 NOTE — Telephone Encounter (Signed)
Caller name: Leonia Reader Relation to pt: self Call back number: [phone] [facility]acy: [facility] [address] [facility]  Reason for call:   Patient thought that Dr. Birdie Riddle was going to send in a cream for her and she states that the pharmacy told her that they did not receive this medication refill.

## 2014-11-15 NOTE — Telephone Encounter (Signed)
Prescription sent to pharamcy °

## 2014-11-15 NOTE — Telephone Encounter (Signed)
Per OV note pt was advised to use triamcinolone cream. Was this OTC or were you sending in a prescription?

## 2014-11-26 ENCOUNTER — Ambulatory Visit: Payer: Medicare Other | Admitting: Podiatry

## 2014-11-28 ENCOUNTER — Telehealth: Payer: Self-pay | Admitting: Family Medicine

## 2014-11-28 NOTE — Telephone Encounter (Signed)
Caller name: mary ellen Relation to pt: self Call back number: [phone] Pharmacy:  Reason for call:   Couldn't remember what strength vit D OTC Dr. Birdie Riddle told her to buy?

## 2014-11-28 NOTE — Telephone Encounter (Signed)
Pt notified to take at least 2,000iu daily.

## 2014-12-09 ENCOUNTER — Telehealth: Payer: Self-pay | Admitting: Family Medicine

## 2014-12-09 DIAGNOSIS — M25562 Pain in left knee: Secondary | ICD-10-CM

## 2014-12-09 NOTE — Telephone Encounter (Signed)
Referral placed, Pt has seen [facility] ortho, Dr. Gladstone Lighter in the past.

## 2014-12-09 NOTE — Telephone Encounter (Signed)
Caller name: mary ellen Relation to pt: self Call back number: [phone] Pharmacy:  Reason for call:   Patient states that she has a torn miniscus and states that by the end of the day her whole leg is swollen. Patient is wanting to know if she can just contact orthopaedics for an appointment??

## 2014-12-10 ENCOUNTER — Ambulatory Visit: Payer: Medicare Other | Admitting: Podiatry

## 2014-12-12 NOTE — Telephone Encounter (Signed)
Pt is schedule for 12/17/14

## 2014-12-24 ENCOUNTER — Other Ambulatory Visit: Payer: Self-pay | Admitting: Family Medicine

## 2015-04-02 ENCOUNTER — Encounter: Payer: Self-pay | Admitting: Gastroenterology

## 2015-04-07 ENCOUNTER — Other Ambulatory Visit: Payer: Self-pay | Admitting: Family Medicine

## 2015-04-07 NOTE — Telephone Encounter (Signed)
Med filled.  

## 2015-05-15 ENCOUNTER — Other Ambulatory Visit: Payer: Self-pay | Admitting: Family Medicine

## 2015-05-15 ENCOUNTER — Ambulatory Visit: Payer: Medicare HMO | Admitting: Family Medicine

## 2015-05-15 ENCOUNTER — Encounter: Payer: Self-pay | Admitting: Family Medicine

## 2015-05-15 ENCOUNTER — Ambulatory Visit (INDEPENDENT_AMBULATORY_CARE_PROVIDER_SITE_OTHER): Payer: Medicare HMO | Admitting: Family Medicine

## 2015-05-15 VITALS — BP 120/72 | HR 53 | Temp 98.0°F | Resp 16 | Wt 185.2 lb

## 2015-05-15 DIAGNOSIS — E038 Other specified hypothyroidism: Secondary | ICD-10-CM | POA: Diagnosis not present

## 2015-05-15 DIAGNOSIS — I10 Essential (primary) hypertension: Secondary | ICD-10-CM | POA: Diagnosis not present

## 2015-05-15 LAB — CBC WITH DIFFERENTIAL/PLATELET
Basophils Absolute: 0 10*3/uL (ref 0.0–0.1)
Basophils Relative: 0.7 % (ref 0.0–3.0)
EOS ABS: 0.1 10*3/uL (ref 0.0–0.7)
EOS PCT: 2.3 % (ref 0.0–5.0)
HCT: 37.5 % (ref 36.0–46.0)
Hemoglobin: 12.7 g/dL (ref 12.0–15.0)
Lymphocytes Relative: 27 % (ref 12.0–46.0)
Lymphs Abs: 1 10*3/uL (ref 0.7–4.0)
MCHC: 33.9 g/dL (ref 30.0–36.0)
MCV: 89 fl (ref 78.0–100.0)
MONOS PCT: 8.3 % (ref 3.0–12.0)
Monocytes Absolute: 0.3 10*3/uL (ref 0.1–1.0)
NEUTROS PCT: 61.7 % (ref 43.0–77.0)
Neutro Abs: 2.4 10*3/uL (ref 1.4–7.7)
Platelets: 235 10*3/uL (ref 150.0–400.0)
RBC: 4.22 Mil/uL (ref 3.87–5.11)
RDW: 13.8 % (ref 11.5–15.5)
WBC: 3.9 10*3/uL — ABNORMAL LOW (ref 4.0–10.5)

## 2015-05-15 LAB — BASIC METABOLIC PANEL
BUN: 9 mg/dL (ref 6–23)
CO2: 27 mEq/L (ref 19–32)
CREATININE: 0.7 mg/dL (ref 0.40–1.20)
Calcium: 9.7 mg/dL (ref 8.4–10.5)
Chloride: 107 mEq/L (ref 96–112)
GFR: 87.88 mL/min (ref >60.00)
GLUCOSE: 85 mg/dL (ref 70–99)
Potassium: 3.9 mEq/L (ref 3.5–5.1)
SODIUM: 141 meq/L (ref 135–145)

## 2015-05-15 LAB — TSH: TSH: 0.13 u[IU]/mL — ABNORMAL LOW (ref 0.35–4.50)

## 2015-05-15 LAB — T3, FREE: T3 FREE: 2.4 pg/mL (ref 2.3–4.2)

## 2015-05-15 LAB — T4, FREE: FREE T4: 0.55 ng/dL — AB (ref 0.60–1.60)

## 2015-05-15 NOTE — Progress Notes (Signed)
Pre visit review using our clinic review tool, if applicable. No additional management support is needed unless otherwise documented below in the visit note. 

## 2015-05-15 NOTE — Progress Notes (Signed)
   Subjective:    Patient ID: [name], female    DOB: March 23, 1946, 69 y.o.   MRN: [id number]  HPI HTN- chronic problem, on Lisinopril daily.  BP is well controlled today.  Denies CP, SOB, HAs, visual changes, edema.  Hypothyroid- chronic problem, on Nature Thyroid daily.  Pt reports increased fatigue for a few months.  Denies changes to skin/hair/nails.   Review of Systems For ROS see HPI     Objective:   Physical Exam  Constitutional: She is oriented to person, place, and time. She appears well-developed and well-nourished. No distress.  HENT:  Head: Normocephalic and atraumatic.  Eyes: Conjunctivae and EOM are normal. Pupils are equal, round, and reactive to light.  Neck: Normal range of motion. Neck supple. No thyromegaly present.  Cardiovascular: Normal rate, regular rhythm, normal heart sounds and intact distal pulses.   No murmur heard. Pulmonary/Chest: Effort normal and breath sounds normal. No respiratory distress.  Abdominal: Soft. She exhibits no distension. There is no tenderness.  Musculoskeletal: She exhibits no edema.  Lymphadenopathy:    She has no cervical adenopathy.  Neurological: She is alert and oriented to person, place, and time.  Skin: Skin is warm and dry.  Psychiatric: She has a normal mood and affect. Her behavior is normal.  Vitals reviewed.         Assessment & Plan:

## 2015-05-15 NOTE — Assessment & Plan Note (Signed)
Chronic problem, adequate control.  Good med compliance.  Asymptomatic.  Check labs.  Adjust meds prn.

## 2015-05-15 NOTE — Assessment & Plan Note (Signed)
Chronic problem.  + fatigue.  Check labs.  Adjust meds prn  

## 2015-05-15 NOTE — Patient Instructions (Signed)
Schedule your complete physical in 6 months We'll notify you of your lab results and make any changes if needed Keep up the good work!  You look great! Call with any questions or concerns Enjoy the rest of your summer!!!

## 2015-06-04 ENCOUNTER — Telehealth: Payer: Self-pay | Admitting: Family Medicine

## 2015-06-04 NOTE — Telephone Encounter (Signed)
Patient scheduled cpe/pap/new patient appointment with Melissa for 11/19/2015

## 2015-06-04 NOTE — Telephone Encounter (Signed)
Patient had questions regarding what her Endocrinology would manage, notified patient it was thyroid medications.  Patient requested labs mailed to her home address- labs sent.    Patient requests to schedule with either Debbrah Alar or Dr. Etter Sjogren.  Please call patient with availabilities

## 2015-06-04 NOTE — Telephone Encounter (Signed)
[name] [phone]  1. Wants to know which other female provider you would recommend in this office 2. Copy of labs 05/15/15 3. General health questions

## 2015-06-13 ENCOUNTER — Telehealth: Payer: Self-pay | Admitting: Family Medicine

## 2015-06-13 ENCOUNTER — Ambulatory Visit (INDEPENDENT_AMBULATORY_CARE_PROVIDER_SITE_OTHER): Payer: Medicare HMO | Admitting: Endocrinology

## 2015-06-13 ENCOUNTER — Encounter: Payer: Self-pay | Admitting: Endocrinology

## 2015-06-13 VITALS — BP 136/84 | HR 58 | Temp 98.1°F | Ht 64.5 in | Wt 182.0 lb

## 2015-06-13 DIAGNOSIS — E038 Other specified hypothyroidism: Secondary | ICD-10-CM

## 2015-06-13 NOTE — Patient Instructions (Signed)
I agree with what Dr Birdie Riddle is advising you, which is this: is best for you health to go back to the levothyroxine (T4).  This allows your body's natural mechanism to convert it over to T3.  However, if you choose to continue the "nature-throid," you should reduce the amount.   I would be happy to see you back here as necessary.

## 2015-06-13 NOTE — Progress Notes (Signed)
Subjective:    Patient ID: [name], female    DOB: [birth year], 69 y.o.   MRN: [id number]  HPI Pt reports hypothyroidism was dx'ed in 1991.  she took levothyroxine until 2014, when she had sxs of dry skin, and was changed to nature-throid.  she has never taken kelp or any other type of non-prescribed thyroid product.  she has never had thyroid imaging.  She is not considering a pregnancy.  He has never had thyroid surgery, or XRT to the neck.  He has never been on amiodarone or lithium.  She has slight arthralgias throughout the body, and assoc weight loss. Past Medical History  Diagnosis Date  . Arthritis   . Seasonal allergies   . Hx of blood clots 1967    from birth control in LUE  . UTI (urinary tract infection)   . Hypertension   . Hypothyroidism   . Asthma     mild  . H/O bronchitis   . Pneumonia     hx of  . Hypoglycemia   . Migraine     from food  . Sleep apnea     Past Surgical History  Procedure Laterality Date  . Detatched retina  2003    rt eye  . Cataract extraction      rt eye  . Colonoscopy    . Total hip arthroplasty Right 09/19/2013    Procedure: RIGHT TOTAL HIP ARTHROPLASTY;  Surgeon: Tobi Bastos, MD;  Location: [facility];  Service: Orthopedics;  Laterality: Right;    Social History   Social History  . Marital Status: Legally Separated    Spouse Name: N/A  . Number of Children: N/A  . Years of Education: N/A   Occupational History  . Not on file.   Social History Main Topics  . Smoking status: Former Smoker -- 3.00 packs/day for 13 years    Types: Cigarettes    Quit date: 10/11/1974  . Smokeless tobacco: Never Used  . Alcohol Use: No  . Drug Use: No  . Sexual Activity: Not on file   Other Topics Concern  . Not on file   Social History Narrative    Current Outpatient Prescriptions on File Prior to Visit  Medication Sig Dispense Refill  . acetaminophen (TYLENOL) 500 MG tablet Take 500 mg by mouth as needed for mild pain or  moderate pain.     Marland Kitchen albuterol (PROVENTIL HFA;VENTOLIN HFA) 108 (90 BASE) MCG/ACT inhaler Inhale 2 puffs into the lungs every 4 (four) hours as needed for wheezing or shortness of breath.    . budesonide-formoterol (SYMBICORT) 160-4.5 MCG/ACT inhaler Inhale 2 puffs into the lungs 2 (two) times daily as needed (shortness of breath).     . Cholecalciferol (VITAMIN D) 2000 UNITS CAPS Take by mouth.    . fexofenadine (ALLEGRA) 60 MG tablet Take 60 mg by mouth daily.    Marland Kitchen lisinopril (PRINIVIL,ZESTRIL) 20 MG tablet TAKE 1 TABLET BY MOUTH AT BEDTIME 30 tablet 6  . Multiple Vitamins-Minerals (CENTRUM SILVER ADULT 50+ PO) Take by mouth.    Marland Kitchen NATURE-THROID 130 MG tablet TAKE 1 TABLET BY MOUTH EVERY DAY 30 tablet 6  . triamcinolone ointment (KENALOG) 0.1 % Apply 1 application topically 2 (two) times daily. 90 g 1   No current facility-administered medications on file prior to visit.    Allergies  Allergen Reactions  . Codeine Hives  . Pork-Derived Products   . Prednisone Other (See Comments)    Causes more pain  Family History  Problem Relation Age of Onset  . Rheum arthritis Mother   . Rheum arthritis Father   . Rheum arthritis Maternal Grandmother   . Rheum arthritis Maternal Grandfather   . Rheum arthritis Paternal Grandmother   . Thyroid disease Mother   . Thyroid disease Father     BP 136/84 mmHg  Pulse 58  Temp(Src) 98.1 F (36.7 C) (Oral)  Ht 5' 4.5" (1.638 m)  Wt 182 lb (82.555 kg)  BMI 30.77 kg/m2  SpO2 92%  Review of Systems denies depression, hair loss, muscle cramps, sob, memory loss, constipation, numbness, blurry vision, cold intolerance, dry skin, rhinorrhea, easy bruising, and syncope.     Objective:   Physical Exam VS: see vs page GEN: no distress HEAD: head: no deformity eyes: no periorbital swelling, no proptosis external nose and ears are normal mouth: no lesion seen NECK: supple, thyroid is not enlarged CHEST WALL: no deformity LUNGS:  Clear to  auscultation CV: reg rate and rhythm, no murmur ABD: abdomen is soft, nontender.  no hepatosplenomegaly.  not distended.  no hernia MUSCULOSKELETAL: muscle bulk and strength are grossly normal.  no obvious joint swelling.  gait is normal and steady EXTEMITIES: no deformity.  no ulcer on the feet.  feet are of normal color and temp.  no edema PULSES: dorsalis pedis intact bilat.  no carotid bruit NEURO:  cn 2-12 grossly intact.   readily moves all 4's.  sensation is intact to touch on the feet SKIN:  Normal texture and temperature.  No rash or suspicious lesion is visible.   NODES:  None palpable at the neck PSYCH: alert, well-oriented.  Does not appear anxious nor depressed.   Lab Results  Component Value Date   TSH 0.13* 05/15/2015  free T4=0.55 free T3=2.4  DEXA (08/01/14): normal  i have reviewed outside records: pt was seen and noted to have sxs c/w hypothyroidism despite suppressed TSH.      Assessment & Plan:  Hypothyroidism, new to me, overreplaced.  The low TSH is driven by T3 component of the nature-thyroid, but it is too short-acting to be detected on the test.    Patient is advised the following: Patient Instructions  I agree with what Dr Birdie Riddle is advising you, which is this: is best for you health to go back to the levothyroxine (T4).  This allows your body's natural mechanism to convert it over to T3.  However, if you choose to continue the "nature-throid," you should reduce the amount.   I would be happy to see you back here as necessary.

## 2015-06-13 NOTE — Telephone Encounter (Signed)
Relation to RF:FMBW Call back [phone] Pharmacy: [facility] - [place], [address] [phone] (Phone) [phone] (Fax)         Reason for call:  Patient would like PCP to review Dr. Renato Shin, MD report for D.O.S 06/13/15 and would like to discuss thyroid medication before PCP sends to pharmacy

## 2015-06-17 NOTE — Telephone Encounter (Signed)
Addressed in 'review chart' message- please copy and paste into this note

## 2015-06-17 NOTE — Telephone Encounter (Signed)
Please advise 

## 2015-06-17 NOTE — Telephone Encounter (Signed)
Called pt back today, phone only rang no option to LMOVM. Will call back again in the morning.

## 2015-06-17 NOTE — Telephone Encounter (Signed)
Reviewed Dr Cordelia Pen note in which he agrees to switch back to Levothyroxine or decreasing amount of your current natural thyroid medication.         Not sure what questions she has at this time    KT, MD

## 2015-06-20 MED ORDER — THYROID 113.75 MG PO TABS: 1.0000 | ORAL_TABLET | Freq: Every day | ORAL | Status: DC

## 2015-06-20 NOTE — Addendum Note (Signed)
Addended by: Davis Gourd on: 06/20/2015 04:33 PM   Modules accepted: Orders

## 2015-06-20 NOTE — Telephone Encounter (Signed)
Called pt and lMOVM to return call.

## 2015-06-20 NOTE — Telephone Encounter (Signed)
Medication filled to pharmacy as requested.   

## 2015-06-20 NOTE — Telephone Encounter (Signed)
Pt would like to decrease and remain on the Nature-thyroid prescription.

## 2015-06-20 NOTE — Telephone Encounter (Signed)
Please send script for Nature-Throid 113 to pharmacy

## 2015-06-26 ENCOUNTER — Other Ambulatory Visit: Payer: Self-pay | Admitting: Family Medicine

## 2015-06-26 NOTE — Telephone Encounter (Signed)
Medication filled to pharmacy as requested.   

## 2015-07-04 ENCOUNTER — Ambulatory Visit (INDEPENDENT_AMBULATORY_CARE_PROVIDER_SITE_OTHER): Payer: Medicare HMO | Admitting: *Deleted

## 2015-07-04 DIAGNOSIS — Z23 Encounter for immunization: Secondary | ICD-10-CM

## 2015-07-04 NOTE — Progress Notes (Signed)
Pre visit review using our clinic review tool, if applicable. No additional management support is needed unless otherwise documented below in the visit note. 

## 2015-07-16 ENCOUNTER — Telehealth: Payer: Self-pay | Admitting: Family Medicine

## 2015-07-16 NOTE — Telephone Encounter (Signed)
Please notify pt

## 2015-07-16 NOTE — Telephone Encounter (Signed)
Caller name:[name] Relationship to patient:self Can be reached:[phone] Pharmacy: [facility] [place]   Reason for call:a physical therapist recommended that she starts taking glucosamine for her joints.  She read that its is made from glucose.  Is this goon affect her blood sugar

## 2015-07-16 NOTE — Telephone Encounter (Signed)
It should not impact her blood sugars.  She can start supplement

## 2015-07-16 NOTE — Telephone Encounter (Signed)
Pt informed and stated an understanding.  

## 2015-09-30 ENCOUNTER — Ambulatory Visit (INDEPENDENT_AMBULATORY_CARE_PROVIDER_SITE_OTHER): Payer: Medicare HMO | Admitting: Medical

## 2015-09-30 ENCOUNTER — Encounter: Payer: Self-pay | Admitting: Medical

## 2015-09-30 VITALS — BP 126/80 | HR 80 | Temp 98.0°F | Ht 64.5 in | Wt 194.8 lb

## 2015-09-30 DIAGNOSIS — J209 Acute bronchitis, unspecified: Secondary | ICD-10-CM

## 2015-09-30 DIAGNOSIS — R059 Cough, unspecified: Secondary | ICD-10-CM

## 2015-09-30 DIAGNOSIS — R05 Cough: Secondary | ICD-10-CM | POA: Diagnosis not present

## 2015-09-30 DIAGNOSIS — J0100 Acute maxillary sinusitis, unspecified: Secondary | ICD-10-CM

## 2015-09-30 MED ORDER — AZITHROMYCIN 250 MG PO TABS: ORAL_TABLET | ORAL | Status: DC

## 2015-09-30 MED ORDER — BENZONATATE 100 MG PO CAPS: 100.0000 mg | ORAL_CAPSULE | Freq: Three times a day (TID) | ORAL | Status: DC | PRN

## 2015-09-30 MED ORDER — AZELASTINE HCL 0.1 % NA SOLN: 2.0000 | Freq: Two times a day (BID) | NASAL | Status: DC

## 2015-09-30 NOTE — Progress Notes (Signed)
Pre visit review using our clinic review tool, if applicable. No additional management support is needed unless otherwise documented below in the visit note. 

## 2015-09-30 NOTE — Patient Instructions (Addendum)
You appear to have sinus infection and bronchitis and will give azithromycin antibiotic.   For nasal congestion will rx astelin.(may have had allergic component to recent illness early on)  For cough will rx benzonatate.  Follow up 7 days or as needed

## 2015-09-30 NOTE — Progress Notes (Signed)
Subjective:    Patient ID: [name], female    DOB: Mar 28, 1946, 69 y.o.   MRN: [id number]  HPI  Pt in for one week of feeling sick. Started mild rt ear irritation but then got dry cough, nasal congestion, pnd and some chest congestion. This morning has productive yellow/green mucous. Feels warm and some chills for 3 days.  Some sneezing early on.  Review of Systems  Constitutional: Positive for fever, chills and fatigue. Negative for diaphoresis.  HENT: Positive for congestion, ear pain, postnasal drip and sinus pressure.   Respiratory: Positive for cough. Negative for shortness of breath and wheezing.   Cardiovascular: Negative for chest pain and palpitations.  Gastrointestinal: Negative for abdominal pain.  Musculoskeletal: Negative for back pain.  Hematological: Negative for adenopathy. Does not bruise/bleed easily.  Psychiatric/Behavioral: Negative for behavioral problems and confusion.    Past Medical History  Diagnosis Date  . Arthritis   . Seasonal allergies   . Hx of blood clots 1967    from birth control in LUE  . UTI (urinary tract infection)   . Hypertension   . Hypothyroidism   . Asthma     mild  . H/O bronchitis   . Pneumonia     hx of  . Hypoglycemia   . Migraine     from food  . Sleep apnea     Social History   Social History  . Marital Status: Legally Separated    Spouse Name: N/A  . Number of Children: N/A  . Years of Education: N/A   Occupational History  . Not on file.   Social History Main Topics  . Smoking status: Former Smoker -- 3.00 packs/day for 13 years    Types: Cigarettes    Quit date: 10/11/1974  . Smokeless tobacco: Never Used  . Alcohol Use: No  . Drug Use: No  . Sexual Activity: Not on file   Other Topics Concern  . Not on file   Social History Narrative    Past Surgical History  Procedure Laterality Date  . Detatched retina  2003    rt eye  . Cataract extraction      rt eye  . Colonoscopy    . Total  hip arthroplasty Right 09/19/2013    Procedure: RIGHT TOTAL HIP ARTHROPLASTY;  Surgeon: Tobi Bastos, MD;  Location: [facility];  Service: Orthopedics;  Laterality: Right;    Family History  Problem Relation Age of Onset  . Rheum arthritis Mother   . Rheum arthritis Father   . Rheum arthritis Maternal Grandmother   . Rheum arthritis Maternal Grandfather   . Rheum arthritis Paternal Grandmother   . Thyroid disease Mother   . Thyroid disease Father     Allergies  Allergen Reactions  . Codeine Hives  . Pork-Derived Products   . Prednisone Other (See Comments)    Causes more pain    Current Outpatient Prescriptions on File Prior to Visit  Medication Sig Dispense Refill  . acetaminophen (TYLENOL) 500 MG tablet Take 500 mg by mouth as needed for mild pain or moderate pain.     . Cholecalciferol (VITAMIN D) 2000 UNITS CAPS Take by mouth.    . fexofenadine (ALLEGRA) 60 MG tablet Take 60 mg by mouth daily.    Marland Kitchen lisinopril (PRINIVIL,ZESTRIL) 20 MG tablet TAKE 1 TABLET BY MOUTH EVERY NIGHT AT BEDTIME 30 tablet 6  . Thyroid 113.75 MG TABS Take 1 tablet by mouth daily. 30 tablet 6  .  triamcinolone ointment (KENALOG) 0.1 % Apply 1 application topically 2 (two) times daily. 90 g 1   No current facility-administered medications on file prior to visit.    BP 126/80 mmHg  Pulse 80  Temp(Src) 98 F (36.7 C) (Oral)  Ht 5' 4.5" (1.638 m)  Wt 194 lb 12.8 oz (88.361 kg)  BMI 32.93 kg/m2  SpO2 97%       Objective:   Physical Exam  General  Mental Status - Alert. General Appearance - Well groomed. Not in acute distress.  Skin Rashes- No Rashes.  HEENT Head- Normal. Ear Auditory Canal - Left- Normal. Right - Normal.Tympanic Membrane- Left- both tm red. Right- both tm red. Eye Sclera/Conjunctiva- Left- Normal. Right- Normal. Nose & Sinuses Nasal Mucosa- Left-  Boggy and Congested. Right-  Boggy and  Congested.Bilateral maxillary tenderness but no  frontal sinus pressure. Mouth &  Throat Lips: Upper Lip- Normal: no dryness, cracking, pallor, cyanosis, or vesicular eruption. Lower Lip-Normal: no dryness, cracking, pallor, cyanosis or vesicular eruption. Buccal Mucosa- Bilateral- No Aphthous ulcers. Oropharynx- No Discharge or Erythema. +pnd Tonsils: Characteristics- Bilateral- No Erythema or Congestion. Size/Enlargement- Bilateral- No enlargement. Discharge- bilateral-None.  Neck Neck- Supple. No Masses.   Chest and Lung Exam Auscultation: Breath Sounds:-Clear even and unlabored.  Cardiovascular Auscultation:Rythm- Regular, rate and rhythm. Murmurs & Other Heart Sounds:Ausculatation of the heart reveal- No Murmurs.  Lymphatic Head & Neck General Head & Neck Lymphatics: Bilateral: Description- No Localized lymphadenopathy.      See assesment/plan  See below.           You appear to have sinus infection and bronchitis and will give azithromycin antibiotic.   For nasal congestion will rx astelin.(may have had allergic component to recent illness early on)  For cough will rx benzonatate.  Follow up in 7 days or as needed

## 2015-10-30 DIAGNOSIS — R69 Illness, unspecified: Secondary | ICD-10-CM | POA: Diagnosis not present

## 2015-11-18 ENCOUNTER — Encounter: Payer: Self-pay | Admitting: Behavioral Health

## 2015-11-18 ENCOUNTER — Telehealth: Payer: Self-pay | Admitting: Behavioral Health

## 2015-11-18 NOTE — Telephone Encounter (Signed)
Patient voiced that she was on the way to take her granddaughter to a doctor's appointment, therefore she's unable to complete pre-visit info.

## 2015-11-19 ENCOUNTER — Telehealth: Payer: Self-pay | Admitting: Family

## 2015-11-19 ENCOUNTER — Encounter: Payer: Medicare HMO | Admitting: Family Medicine

## 2015-11-19 ENCOUNTER — Encounter: Payer: Self-pay | Admitting: Family

## 2015-11-19 ENCOUNTER — Ambulatory Visit (INDEPENDENT_AMBULATORY_CARE_PROVIDER_SITE_OTHER): Payer: Medicare HMO | Admitting: Family

## 2015-11-19 VITALS — HR 51 | Temp 97.9°F | Resp 16 | Ht 65.0 in | Wt 192.4 lb

## 2015-11-19 DIAGNOSIS — E039 Hypothyroidism, unspecified: Secondary | ICD-10-CM

## 2015-11-19 DIAGNOSIS — E038 Other specified hypothyroidism: Secondary | ICD-10-CM

## 2015-11-19 DIAGNOSIS — Z0000 Encounter for general adult medical examination without abnormal findings: Secondary | ICD-10-CM | POA: Diagnosis not present

## 2015-11-19 DIAGNOSIS — Z1231 Encounter for screening mammogram for malignant neoplasm of breast: Secondary | ICD-10-CM

## 2015-11-19 DIAGNOSIS — E559 Vitamin D deficiency, unspecified: Secondary | ICD-10-CM | POA: Diagnosis not present

## 2015-11-19 DIAGNOSIS — Z23 Encounter for immunization: Secondary | ICD-10-CM

## 2015-11-19 DIAGNOSIS — I10 Essential (primary) hypertension: Secondary | ICD-10-CM

## 2015-11-19 DIAGNOSIS — G4733 Obstructive sleep apnea (adult) (pediatric): Secondary | ICD-10-CM

## 2015-11-19 DIAGNOSIS — Z1239 Encounter for other screening for malignant neoplasm of breast: Secondary | ICD-10-CM

## 2015-11-19 LAB — BASIC METABOLIC PANEL
BUN: 12 mg/dL (ref 6–23)
CO2: 29 mEq/L (ref 19–32)
CREATININE: 0.71 mg/dL (ref 0.40–1.20)
Calcium: 9.8 mg/dL (ref 8.4–10.5)
Chloride: 106 mEq/L (ref 96–112)
GFR: 86.33 mL/min (ref >60.00)
Glucose, Bld: 85 mg/dL (ref 70–99)
Potassium: 4.1 mEq/L (ref 3.5–5.1)
Sodium: 142 mEq/L (ref 135–145)

## 2015-11-19 LAB — TSH: TSH: 0.1 u[IU]/mL — ABNORMAL LOW (ref 0.35–4.50)

## 2015-11-19 LAB — VITAMIN D 25 HYDROXY (VIT D DEFICIENCY, FRACTURES): VITD: 46.82 ng/mL (ref 30.00–100.00)

## 2015-11-19 MED ORDER — THYROID 97.5 MG PO TABS: 1.0000 | ORAL_TABLET | Freq: Every day | ORAL | Status: DC

## 2015-11-19 NOTE — Progress Notes (Signed)
Subjective:    [name] is a 70 y.o. female who presents for Medicare Annual/Subsequent preventive examination.  Preventive Screening-Counseling & Management  Tobacco History  Smoking status  . Former Smoker -- 3.00 packs/day for 13 years  . Types: Cigarettes  . Quit date: 10/11/1974  Smokeless tobacco  . Never Used     Immunizations: Tdap 2009, flu shot up to date, prevnar up to date, zostavax complete Diet: overall diet has been healthy Exercise: some, has a left meninscus tear and and has been having some left knee pain (sees Dr. Gladstone Lighter) Colonoscopy: colo- 2009- normal due 2019 Dexa: 10/15 Pap Smear: declines Mammogram: 10/15   Problems Prior to Visit 1. HTN- on lisinopril 20mg .   BP Readings from Last 3 Encounters:  09/30/15 126/80  06/13/15 136/84  05/15/15 120/72   2.  Hypothyroid- maintained on Thyroid 113.75.  Lab Results  Component Value Date   TSH 0.13* 05/15/2015   3. Vit D deficiency- on otc supplement  4. OSA- was on CPAP but needs new tubing, overdue for follow up with Dr. Elsworth Soho. has seen Dr. Elsworth Soho in the past.   Current Problems (verified) Patient Active Problem List   Diagnosis Date Noted  . Constipation 09/25/2013  . Acute blood loss anemia 09/20/2013  . Osteoarthritis of right hip 09/19/2013  . 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 Heel pain 02/16/2013  . Viral URI 08/26/2012  . Knee pain 07/05/2012  . Bilateral bunions 07/05/2012  . Screening for malignant neoplasm of the cervix 07/05/2012  . Physical exam 07/05/2012  . Sinusitis 02/18/2012  . Anxiety and depression 11/02/2011  . Sleep apnea 11/02/2011  . ASTHMA, INTERMITTENT 08/12/2010  . WHEEZING 12/30/2009  . PANIC DISORDER 05/07/2009  . HYPERTENSION, BENIGN 12/05/2008  . Hypothyroidism 03/27/2008  . ARTHRITIS 03/27/2008  . Allergic rhinitis, cause unspecified 08/26/2007    Medications Prior to Visit Current Outpatient Prescriptions on File Prior to Visit   Medication Sig Dispense Refill  . acetaminophen (TYLENOL) 500 MG tablet Take 500 mg by mouth as needed for mild pain or moderate pain.     . Cholecalciferol (VITAMIN D) 2000 UNITS CAPS Take by mouth.    . fexofenadine (ALLEGRA) 60 MG tablet Take 60 mg by mouth daily.    Marland Kitchen lisinopril (PRINIVIL,ZESTRIL) 20 MG tablet TAKE 1 TABLET BY MOUTH EVERY NIGHT AT BEDTIME 30 tablet 6  . Thyroid 113.75 MG TABS Take 1 tablet by mouth daily. 30 tablet 6  . triamcinolone ointment (KENALOG) 0.1 % Apply 1 application topically 2 (two) times daily. 90 g 1   No current facility-administered medications on file prior to visit.    Current Medications (verified) Current Outpatient Prescriptions  Medication Sig Dispense Refill  . acetaminophen (TYLENOL) 500 MG tablet Take 500 mg by mouth as needed for mild pain or moderate pain.     . Ascorbic Acid (VITAMIN C) 1000 MG tablet Take 1,000 mg by mouth daily.    . Cholecalciferol (VITAMIN D) 2000 UNITS CAPS Take by mouth.    . fexofenadine (ALLEGRA) 60 MG tablet Take 60 mg by mouth daily.    Marland Kitchen glucosamine-chondroitin 500-400 MG tablet Take 1 tablet by mouth 2 (two) times daily.    Marland Kitchen lisinopril (PRINIVIL,ZESTRIL) 20 MG tablet TAKE 1 TABLET BY MOUTH EVERY NIGHT AT BEDTIME 30 tablet 6  . Thyroid 113.75 MG TABS Take 1 tablet by mouth daily. 30 tablet 6  . triamcinolone ointment (KENALOG) 0.1 % Apply 1 application topically 2 (two) times  daily. 90 g 1  . vitamin B-12 (CYANOCOBALAMIN) 1000 MCG tablet Take 1,000 mcg by mouth daily.     No current facility-administered medications for this visit.     Allergies (verified) Codeine; Pork-derived products; and Prednisone   PAST HISTORY  Family History Family History  Problem Relation Age of Onset  . Rheum arthritis Mother   . Rheum arthritis Father   . Rheum arthritis Maternal Grandmother   . Rheum arthritis Maternal Grandfather   . Rheum arthritis Paternal Grandmother   . Thyroid disease Mother   . Thyroid disease  Father     Social History Social History  Substance Use Topics  . Smoking status: Former Smoker -- 3.00 packs/day for 13 years    Types: Cigarettes    Quit date: 10/11/1974  . Smokeless tobacco: Never Used  . Alcohol Use: No     Are there smokers in your home (other than you)? No  Risk Factors Current exercise habits: Gym/ health club routine includes 2 x a week.  Dietary issues discussed: continue healthy diet   Cardiac risk factors: advanced age (older than 85 for men, 60 for women).  Depression Screen (Note: if answer to either of the following is "Yes", a more complete depression screening is indicated)   Over the past two weeks, have you felt down, depressed or hopeless? No  Over the past two weeks, have you felt little interest or pleasure in doing things? No  Have you lost interest or pleasure in daily life? No  Do you often feel hopeless? No  Do you cry easily over simple problems? No  Activities of Daily Living In your present state of health, do you have any difficulty performing the following activities?:  Driving? No Managing money?  No Feeding yourself? No Getting from bed to chair? No . Climbing a flight of stairs? No Preparing food and eating?: No Bathing or showering? No Getting dressed: No Getting to the toilet? No Using the toilet:No Moving around from place to place: No In the past year have you fallen or had a near fall?:No   Are you sexually active?  No  Do you have more than one partner?  No  Hearing Difficulties: No Do you often ask people to speak up or repeat themselves? No Do you experience ringing or noises in your ears? No Do you have difficulty understanding soft or whispered voices? No   Do you feel that you have a problem with memory? No  Do you often misplace items? No  Do you feel safe at home?  Yes  Cognitive Testing  Alert? Yes  Normal Appearance?Yes  Oriented to person? Yes  Place? Yes   Time? Yes  Recall of three  objects?  Yes  Can perform simple calculations? Yes  Displays appropriate judgment?Yes  Can read the correct time from a watch face?Yes   Advanced Directives have been discussed with the patient? Yes  List the Names of Other Physician/Practitioners you currently use: 1.  See care team- updated  Indicate any recent Medical Services you may have received from other than [facility] providers in the past year (date may be approximate).  Immunization History  Administered Date(s) Administered  . Influenza Split 07/20/2011, 07/05/2012  . Influenza Whole 08/31/2007, 07/18/2008, 07/17/2009, 07/09/2010  . Influenza, High Dose Seasonal PF 08/04/2013, 07/04/2015  . Influenza,inj,Quad PF,36+ Mos 08/06/2014  . Pneumococcal Conjugate-13 11/14/2014  . Pneumococcal Polysaccharide-23 09/27/2007  . Td 03/27/2008  . Zoster 11/02/2011    Screening Tests Health  Maintenance  Topic Date Due  . Hepatitis C Screening  11/26/1945  . PNA vac Low Risk Adult (2 of 2 - PPSV23) 11/15/2015  . INFLUENZA VACCINE  05/11/2016  . MAMMOGRAM  08/01/2016  . TETANUS/TDAP  03/27/2018  . COLONOSCOPY  05/13/2018  . DEXA SCAN  Completed  . ZOSTAVAX  Completed    All answers were reviewed with the patient and necessary referrals were made:  O'SULLIVAN,Aniken Monestime S., NP   11/19/2015   History reviewed: allergies, current medications, past family history, past medical history, past social history, past surgical history and problem list  Review of Systems .    Review of Systems  Constitutional: Negative for weight loss.  HENT: Negative for hearing loss.   Eyes: Negative for blurred vision.  Respiratory: Negative for cough and shortness of breath.   Cardiovascular: Negative for chest pain.  Gastrointestinal: Negative for diarrhea.       Mild constipation  Genitourinary: Negative for dysuria and frequency.  Musculoskeletal: Negative for myalgias.  Skin: Negative for rash.  Neurological:       Denies recent HA   Psychiatric/Behavioral: Negative for depression. The patient is not nervous/anxious.       Objective:     Vision by Snellen chart: right eye:., left eye:.  Body mass index is 32.02 kg/(m^2). Pulse 51  Temp(Src) 97.9 F (36.6 C) (Oral)  Resp 16  Ht 5\' 5"  (1.651 m)  Wt 192 lb 6.4 oz (87.272 kg)  BMI 32.02 kg/m2  SpO2 100%   Physical Exam  Constitutional: She is oriented to person, place, and time. She appears well-developed and well-nourished. No distress.  HENT:  Head: Normocephalic and atraumatic.  Right Ear: Tympanic membrane and ear canal normal.  Left Ear: Tympanic membrane and ear canal normal.  Mouth/Throat: Oropharynx is clear and moist.  Eyes: Pupils are equal, round, and reactive to light. No scleral icterus.  Neck: Normal range of motion. No thyromegaly present.  Cardiovascular: Normal rate and regular rhythm.   No murmur heard. Pulmonary/Chest: Effort normal and breath sounds normal. No respiratory distress. He has no wheezes. She has no rales. She exhibits no tenderness.  Abdominal: Soft. Bowel sounds are normal. He exhibits no distension and no mass. There is no tenderness. There is no rebound and no guarding.  Musculoskeletal: She exhibits no edema.  Lymphadenopathy:    She has no cervical adenopathy.  Neurological: She is alert and oriented to person, place, and time. She has normal reflexes. She exhibits normal muscle tone. Coordination normal.  Skin: Skin is warm and dry.  Psychiatric: She has a normal mood and affect. Her behavior is normal. Judgment and thought content normal.  Breasts: Examined lying Right: Without masses, retractions, discharge or axillary adenopathy.  Left: Without masses, retractions, discharge or axillary adenopathy.  Pelvic:declined     Assessment & Plan:    Assessment:          Plan:     During the course of the visit the patient was educated and counseled about appropriate screening and preventive services including:     Pneumococcal vaccine   Screening mammography  Advanced directives: pt given copy of [place] POA paperwork and  MOST form  Diet review for nutrition referral? Yes ____  Not Indicated __x__   Patient Instructions (the written plan) was given to the patient.  Medicare Attestation I have personally reviewed: The patient's medical and social history Their use of alcohol, tobacco or illicit drugs Their current medications and supplements The patient's functional  ability including ADLs,fall risks, home safety risks, cognitive, and hearing and visual impairment Diet and physical activities Evidence for depression or mood disorders  The patient's weight, height, BMI, and visual acuity have been recorded in the chart.  I have made referrals, counseling, and provided education to the patient based on review of the above and I have provided the patient with a written personalized care plan for preventive services.     O'SULLIVAN,Jadyn Brasher S., NP   11/19/2015

## 2015-11-19 NOTE — Telephone Encounter (Signed)
Thyroid is overtreated. Decrease thyroid from 113.75 to 97mg  once daily. Repeat tsh in 6 weeks.

## 2015-11-19 NOTE — Progress Notes (Signed)
Pre visit review using our clinic review tool, if applicable. No additional management support is needed unless otherwise documented below in the visit note. 

## 2015-11-19 NOTE — Patient Instructions (Addendum)
Please complete lab work prior to leaving. Schedule mammogram on the first floor in our imaging department. Review advanced directives and bring [place] a copy after you have filled out. If you decide to fill out the pink MOST form, please bring it to your next appointment with me or Dr. Birdie Riddle and we will fill together.

## 2015-11-20 NOTE — Addendum Note (Signed)
Addended by: Rudene Anda on: 11/20/2015 04:00 PM   Modules accepted: Orders

## 2015-11-20 NOTE — Telephone Encounter (Signed)
Pt notified and made aware.  She stated understanding and agrees with plan.  Lab appt scheduled.  Future lab ordered.

## 2015-12-16 DIAGNOSIS — Z1231 Encounter for screening mammogram for malignant neoplasm of breast: Secondary | ICD-10-CM | POA: Diagnosis not present

## 2016-01-01 ENCOUNTER — Other Ambulatory Visit (INDEPENDENT_AMBULATORY_CARE_PROVIDER_SITE_OTHER): Payer: Medicare HMO

## 2016-01-01 DIAGNOSIS — E038 Other specified hypothyroidism: Secondary | ICD-10-CM

## 2016-01-01 LAB — TSH: TSH: 0.46 u[IU]/mL (ref 0.35–4.50)

## 2016-01-02 ENCOUNTER — Telehealth: Payer: Self-pay | Admitting: Family

## 2016-01-02 DIAGNOSIS — E038 Other specified hypothyroidism: Secondary | ICD-10-CM

## 2016-01-02 MED ORDER — THYROID 90 MG PO TABS: 90.0000 mg | ORAL_TABLET | Freq: Every day | ORAL | Status: DC

## 2016-01-02 NOTE — Telephone Encounter (Signed)
Attempted to call work # but line remains busy. Called cell# and left message to return my call.

## 2016-01-02 NOTE — Telephone Encounter (Signed)
Notified pt of below and scheduled lab appt for 02/17/16 at 9:30am, future order entered.

## 2016-01-02 NOTE — Telephone Encounter (Signed)
Please let pt know that thyroid test looks better but shows we still need to decrease Thyroid med from 97.5 to 90mg  once daily. Follow up in 6 weeks for TSH, dx hypothyroid.

## 2016-01-26 ENCOUNTER — Other Ambulatory Visit: Payer: Self-pay | Admitting: Family Medicine

## 2016-02-03 ENCOUNTER — Emergency Department (HOSPITAL_COMMUNITY)
Admission: EM | Admit: 2016-02-03 | Discharge: 2016-02-04 | Disposition: A | Discharge status: Home / Self Care | Payer: Medicare HMO | Attending: Emergency Medicine | Admitting: Emergency Medicine

## 2016-02-03 DIAGNOSIS — Z87440 Personal history of urinary (tract) infections: Secondary | ICD-10-CM | POA: Insufficient documentation

## 2016-02-03 DIAGNOSIS — G43909 Migraine, unspecified, not intractable, without status migrainosus: Secondary | ICD-10-CM | POA: Diagnosis not present

## 2016-02-03 DIAGNOSIS — I10 Essential (primary) hypertension: Secondary | ICD-10-CM | POA: Insufficient documentation

## 2016-02-03 DIAGNOSIS — S46812A Strain of other muscles, fascia and tendons at shoulder and upper arm level, left arm, initial encounter: Secondary | ICD-10-CM | POA: Insufficient documentation

## 2016-02-03 DIAGNOSIS — S161XXA Strain of muscle, fascia and tendon at neck level, initial encounter: Secondary | ICD-10-CM | POA: Diagnosis not present

## 2016-02-03 DIAGNOSIS — S0093XA Contusion of unspecified part of head, initial encounter: Secondary | ICD-10-CM | POA: Insufficient documentation

## 2016-02-03 DIAGNOSIS — Z86718 Personal history of other venous thrombosis and embolism: Secondary | ICD-10-CM | POA: Diagnosis not present

## 2016-02-03 DIAGNOSIS — Z79899 Other long term (current) drug therapy: Secondary | ICD-10-CM | POA: Diagnosis not present

## 2016-02-03 DIAGNOSIS — S199XXA Unspecified injury of neck, initial encounter: Secondary | ICD-10-CM | POA: Diagnosis not present

## 2016-02-03 DIAGNOSIS — Z8639 Personal history of other endocrine, nutritional and metabolic disease: Secondary | ICD-10-CM | POA: Diagnosis not present

## 2016-02-03 DIAGNOSIS — Y9389 Activity, other specified: Secondary | ICD-10-CM | POA: Insufficient documentation

## 2016-02-03 DIAGNOSIS — S8992XA Unspecified injury of left lower leg, initial encounter: Secondary | ICD-10-CM | POA: Insufficient documentation

## 2016-02-03 DIAGNOSIS — Z87891 Personal history of nicotine dependence: Secondary | ICD-10-CM | POA: Insufficient documentation

## 2016-02-03 DIAGNOSIS — W01198A Fall on same level from slipping, tripping and stumbling with subsequent striking against other object, initial encounter: Secondary | ICD-10-CM | POA: Diagnosis not present

## 2016-02-03 DIAGNOSIS — S0990XA Unspecified injury of head, initial encounter: Secondary | ICD-10-CM | POA: Diagnosis not present

## 2016-02-03 DIAGNOSIS — Z8701 Personal history of pneumonia (recurrent): Secondary | ICD-10-CM | POA: Insufficient documentation

## 2016-02-03 DIAGNOSIS — M1990 Unspecified osteoarthritis, unspecified site: Secondary | ICD-10-CM | POA: Insufficient documentation

## 2016-02-03 DIAGNOSIS — Y92009 Unspecified place in unspecified non-institutional (private) residence as the place of occurrence of the external cause: Secondary | ICD-10-CM | POA: Insufficient documentation

## 2016-02-03 DIAGNOSIS — M25562 Pain in left knee: Secondary | ICD-10-CM

## 2016-02-03 DIAGNOSIS — Y998 Other external cause status: Secondary | ICD-10-CM | POA: Diagnosis not present

## 2016-02-03 DIAGNOSIS — J45909 Unspecified asthma, uncomplicated: Secondary | ICD-10-CM | POA: Diagnosis not present

## 2016-02-03 DIAGNOSIS — S46811A Strain of other muscles, fascia and tendons at shoulder and upper arm level, right arm, initial encounter: Secondary | ICD-10-CM

## 2016-02-03 DIAGNOSIS — W19XXXA Unspecified fall, initial encounter: Secondary | ICD-10-CM

## 2016-02-03 DIAGNOSIS — S4992XA Unspecified injury of left shoulder and upper arm, initial encounter: Secondary | ICD-10-CM | POA: Diagnosis present

## 2016-02-03 NOTE — ED Notes (Addendum)
Pt states that she was walking in the kitchen in some floppy house shoes and got tangled in a chair; pt states that the chair fell over and she tripped over the chair and struck her head on the frig; pt denies LOC; pt states that she has a knot to the left side of her head; no bleeding noted; area tender to palpation; pt states that approx an hour later she had some nausea and felt like she had trouble focusing when she was trying to read; pt denies visual changes currently; pt also c/o left knee pain; mild swelling noted to left outer knee area

## 2016-02-04 ENCOUNTER — Emergency Department (HOSPITAL_COMMUNITY): Payer: Medicare HMO

## 2016-02-04 ENCOUNTER — Encounter (HOSPITAL_COMMUNITY): Payer: Self-pay | Admitting: *Deleted

## 2016-02-04 DIAGNOSIS — S199XXA Unspecified injury of neck, initial encounter: Secondary | ICD-10-CM | POA: Diagnosis not present

## 2016-02-04 DIAGNOSIS — S0093XA Contusion of unspecified part of head, initial encounter: Secondary | ICD-10-CM | POA: Diagnosis not present

## 2016-02-04 DIAGNOSIS — S8992XA Unspecified injury of left lower leg, initial encounter: Secondary | ICD-10-CM | POA: Diagnosis not present

## 2016-02-04 DIAGNOSIS — S46812A Strain of other muscles, fascia and tendons at shoulder and upper arm level, left arm, initial encounter: Secondary | ICD-10-CM | POA: Diagnosis not present

## 2016-02-04 DIAGNOSIS — Z86718 Personal history of other venous thrombosis and embolism: Secondary | ICD-10-CM | POA: Diagnosis not present

## 2016-02-04 DIAGNOSIS — G43909 Migraine, unspecified, not intractable, without status migrainosus: Secondary | ICD-10-CM | POA: Diagnosis not present

## 2016-02-04 DIAGNOSIS — M25562 Pain in left knee: Secondary | ICD-10-CM | POA: Diagnosis not present

## 2016-02-04 DIAGNOSIS — M1990 Unspecified osteoarthritis, unspecified site: Secondary | ICD-10-CM | POA: Diagnosis not present

## 2016-02-04 DIAGNOSIS — S0990XA Unspecified injury of head, initial encounter: Secondary | ICD-10-CM | POA: Diagnosis not present

## 2016-02-04 DIAGNOSIS — I10 Essential (primary) hypertension: Secondary | ICD-10-CM | POA: Diagnosis not present

## 2016-02-04 DIAGNOSIS — J45909 Unspecified asthma, uncomplicated: Secondary | ICD-10-CM | POA: Diagnosis not present

## 2016-02-04 DIAGNOSIS — W01198A Fall on same level from slipping, tripping and stumbling with subsequent striking against other object, initial encounter: Secondary | ICD-10-CM | POA: Diagnosis not present

## 2016-02-04 MED ORDER — ORPHENADRINE CITRATE ER 100 MG PO TB12: 100.0000 mg | ORAL_TABLET | Freq: Two times a day (BID) | ORAL | Status: DC | PRN

## 2016-02-04 MED ORDER — NAPROXEN 250 MG PO TABS: ORAL_TABLET | ORAL | Status: DC

## 2016-02-04 MED ORDER — CYCLOBENZAPRINE HCL 10 MG PO TABS
5.0000 mg | ORAL_TABLET | Freq: Once | ORAL | Status: AC
  Administered 2016-02-04: 5 mg via ORAL
  Filled 2016-02-04: qty 1

## 2016-02-04 NOTE — Discharge Instructions (Signed)
Ice and heat to the painful areas. Take the medications for pain as needed. Recheck for any problems listed on the head injury sheet.  Cryotherapy Cryotherapy is when you put ice on your injury. Ice helps lessen pain and puffiness (swelling) after an injury. Ice works the best when you start using it in the first 24 to 48 hours after an injury. HOME CARE  Put a dry or damp towel between the ice pack and your skin.  You may press gently on the ice pack.  Leave the ice on for no more than 10 to 20 minutes at a time.  Check your skin after 5 minutes to make sure your skin is okay.  Rest at least 20 minutes between ice pack uses.  Stop using ice when your skin loses feeling (numbness).  Do not use ice on someone who cannot tell you when it hurts. This includes small children and people with memory problems (dementia). GET HELP RIGHT AWAY IF:  You have white spots on your skin.  Your skin turns blue or pale.  Your skin feels waxy or hard.  Your puffiness gets worse. MAKE SURE YOU:   Understand these instructions.  Will watch your condition.  Will get help right away if you are not doing well or get worse.   This information is not intended to replace advice given to you by your health care provider. Make sure you discuss any questions you have with your health care provider.   Document Released: 03/15/2008 Document Revised: 12/20/2011 Document Reviewed: 05/20/2011 Elsevier Interactive Patient Education 2016 [place] Injury, Adult You have a head injury. Headaches and throwing up (vomiting) are common after a head injury. It should be easy to wake up from sleeping. Sometimes you must stay in the hospital. Most problems happen within the first 24 hours. Side effects may occur up to 7-10 days after the injury.  WHAT ARE THE TYPES OF HEAD INJURIES? Head injuries can be as minor as a bump. Some head injuries can be more severe. More severe head injuries include:  A  jarring injury to the brain (concussion).  A bruise of the brain (contusion). This mean there is bleeding in the brain that can cause swelling.  A cracked skull (skull fracture).  Bleeding in the brain that collects, clots, and forms a bump (hematoma). WHEN SHOULD I GET HELP RIGHT AWAY?   You are confused or sleepy.  You cannot be woken up.  You feel sick to your stomach (nauseous) or keep throwing up (vomiting).  Your dizziness or unsteadiness is getting worse.  You have very bad, lasting headaches that are not helped by medicine. Take medicines only as told by your doctor.  You cannot use your arms or legs like normal.  You cannot walk.  You notice changes in the black spots in the center of the colored part of your eye (pupil).  You have clear or bloody fluid coming from your nose or ears.  You have trouble seeing. During the next 24 hours after the injury, you must stay with someone who can watch you. This person should get help right away (call 911 in the [place] if you start to shake and are not able to control it (have seizures), you pass out, or you are unable to wake up. HOW CAN I PREVENT A HEAD INJURY IN THE FUTURE?  Wear seat belts.  Wear a helmet while bike riding and playing sports like football.  Stay away from dangerous  activities around the house. WHEN CAN I RETURN TO NORMAL ACTIVITIES AND ATHLETICS? See your doctor before doing these activities. You should not do normal activities or play contact sports until 1 week after the following symptoms have stopped:  Headache that does not go away.  Dizziness.  Poor attention.  Confusion.  Memory problems.  Sickness to your stomach or throwing up.  Tiredness.  Fussiness.  Bothered by bright lights or loud noises.  Anxiousness or depression.  Restless sleep. MAKE SURE YOU:   Understand these instructions.  Will watch your condition.  Will get help right away if you are not doing well or get  worse.   This information is not intended to replace advice given to you by your health care provider. Make sure you discuss any questions you have with your health care provider.   Document Released: 09/09/2008 Document Revised: 10/18/2014 Document Reviewed: 06/04/2013 Elsevier Interactive Patient Education [employer].

## 2016-02-04 NOTE — ED Provider Notes (Signed)
CSN: [id number]     Arrival date & time 02/03/16  2348 History  By signing my name below, I, [name], attest that this documentation has been prepared under the direction and in the presence of Courtney Porter, MD at 00:51 AM. Electronically Signed: Altamease Briggs, ED Scribe. 02/04/2016. 3:02 AM   Chief Complaint  Patient presents with  . Fall   The history is provided by the patient. No language interpreter was used.   [name] is a 70 y.o. female who presents to the Emergency Department for evaluation after a fall tonight at home. Pt states that she tripped after her feet were tangled in her loose slippers. She fell into a chair and onto the floor striking the left side of her head on the corner of the refrigerator. She saw stars but denies LOC.  Associated symptoms include headache that improved after Tylenol and the application of ice, blurred vision resolved, right sided neck pain that radiates to the right shoulder, left knee pain (notes that she already has a lateral meniscus tear at the left knee treated by Dr. Gladstone Briggs), and nausea. She felt lightheaded after the fall but that has resolved.  Pt denies vomiting, double vision, and numbness/tingling in the extremities. She is not on a blood thinner. Her PCP is Dr. Inda Briggs. She stopped smoking over 40 years ago.   Pt has no knowledge of having a history of bradycardia and denies feeling dizzy or lightheaded prior to the fall. On record review pt had pulse rate of 51 at her PCP's office on November 19, 2015 and it was 50 in September 2016 with endocrinologist.   PCP Dr [name]  Past Medical History  Diagnosis Date  . Arthritis   . Seasonal allergies   . Hx of blood clots 1967    from birth control in LUE  . UTI (urinary tract infection)   . Hypertension   . Hypothyroidism   . Asthma     mild  . H/O bronchitis   . Pneumonia     hx of  . Hypoglycemia   . Migraine     from food  . Sleep apnea    Past Surgical  History  Procedure Laterality Date  . Detatched retina  2003    rt eye  . Cataract extraction      rt eye  . Colonoscopy    . Total hip arthroplasty Right 09/19/2013    Procedure: RIGHT TOTAL HIP ARTHROPLASTY;  Surgeon: Courtney Bastos, MD;  Location: [facility];  Service: Orthopedics;  Laterality: Right;   Family History  Problem Relation Age of Onset  . Rheum arthritis Mother   . Rheum arthritis Father   . Rheum arthritis Maternal Grandmother   . Rheum arthritis Maternal Grandfather   . Rheum arthritis Paternal Grandmother   . Thyroid disease Mother   . Thyroid disease Father    Social History  Substance Use Topics  . Smoking status: Former Smoker -- 3.00 packs/day for 13 years    Types: Cigarettes    Quit date: 10/11/1974  . Smokeless tobacco: Never Used  . Alcohol Use: No   Lives at home Lives alone  OB History    No data available     Review of Systems  Eyes: Positive for visual disturbance.  Gastrointestinal: Positive for nausea. Negative for vomiting.  Musculoskeletal: Positive for arthralgias and neck pain.  Neurological: Positive for light-headedness and headaches. Negative for weakness and numbness.  All other systems reviewed and  are negative.  Allergies  Codeine; Pork-derived products; and Prednisone  Home Medications   Prior to Admission medications   Medication Sig Start Date End Date Taking? Authorizing Provider  acetaminophen (TYLENOL) 500 MG tablet Take 500-1,000 mg by mouth as needed for mild pain or moderate pain.    Yes Historical Provider, MD  Ascorbic Acid (VITAMIN C) 1000 MG tablet Take 1,000 mg by mouth daily.   Yes Historical Provider, MD  Cholecalciferol (VITAMIN D) 2000 UNITS CAPS Take 2,000 Units by mouth daily.    Yes Historical Provider, MD  fexofenadine (ALLEGRA) 60 MG tablet Take 60 mg by mouth daily.   Yes Historical Provider, MD  glucosamine-chondroitin 500-400 MG tablet Take 1 tablet by mouth 2 (two) times daily.   Yes Historical  Provider, MD  lisinopril (PRINIVIL,ZESTRIL) 20 MG tablet TAKE 1 TABLET BY MOUTH EVERY NIGHT AT BEDTIME Patient taking differently: TAKE 20 MG BY MOUTH EVERY NIGHT AT BEDTIME 01/26/16  Yes Courtney Alar, NP  thyroid (ARMOUR) 90 MG tablet Take 1 tablet (90 mg total) by mouth daily. 01/02/16  Yes Courtney Alar, NP  triamcinolone ointment (KENALOG) 0.1 % Apply 1 application topically 2 (two) times daily. Patient taking differently: Apply 1 application topically 2 (two) times daily as needed (RASH).  11/15/14  Yes Courtney Minium, MD  vitamin B-12 (CYANOCOBALAMIN) 1000 MCG tablet Take 1,000 mcg by mouth daily.   Yes Historical Provider, MD  naproxen (NAPROSYN) 250 MG tablet Take 1 po BID with food prn pain 02/04/16   Courtney Porter, MD  orphenadrine (NORFLEX) 100 MG tablet Take 1 tablet (100 mg total) by mouth 2 (two) times daily as needed (muscle soreness). 02/04/16   Courtney Porter, MD   BP 175/89 mmHg  Pulse 62  Temp(Src) 98.6 F (37 C) (Oral)  Resp 16  SpO2 100%  Vital signs normal except bradycardia  Physical Exam  Constitutional: She is oriented to person, place, and time. She appears well-developed and well-nourished.  Non-toxic appearance. She does not appear ill. No distress.  HENT:  Head: Normocephalic.  Right Ear: External ear normal.  Left Ear: External ear normal.  Nose: Nose normal. No mucosal edema or rhinorrhea.  Mouth/Throat: Oropharynx is clear and moist and mucous membranes are normal. No dental abscesses or uvula swelling.  TTP at left posterior scalp with some swelling. No crepitance.   Eyes: Conjunctivae and EOM are normal. Pupils are equal, round, and reactive to light.  Neck: Normal range of motion and full passive range of motion without pain. Neck supple.    TTP at right trapezius   Cardiovascular: Normal rate, regular rhythm and normal heart sounds.  Exam reveals no gallop and no friction rub.   No murmur heard. Pulmonary/Chest: Effort normal and breath sounds  normal. No respiratory distress. She has no wheezes. She has no rhonchi. She has no rales. She exhibits no tenderness and no crepitus.  Abdominal: Soft. Normal appearance and bowel sounds are normal. She exhibits no distension. There is no tenderness. There is no rebound and no guarding.  Musculoskeletal: Normal range of motion. She exhibits no tenderness.  Moves all extremities well.  An area of quarter-sized swelling of lateral anterior knee that is non tender, no bruising or swelling noted.  Neurological: She is alert and oriented to person, place, and time. She has normal strength. No cranial nerve deficit.  Skin: Skin is warm, dry and intact. No rash noted. No erythema. No pallor.  Psychiatric: She has a normal mood and affect. Her  speech is normal and behavior is normal. Her mood appears not anxious.  Nursing note and vitals reviewed.   ED Course  Procedures (including critical care time)  Medications  cyclobenzaprine (FLEXERIL) tablet 5 mg (5 mg Oral Given 02/04/16 0129)    DIAGNOSTIC STUDIES: Oxygen Saturation is 100% on RA,  normal by my interpretation.    COORDINATION OF CARE: 12:58 AM Discussed treatment plan which includes CT head without contrast, CT cervical spine without contrast, left knee XR, and Flexeril with pt at bedside and pt agreed to plan.   3:02 AM I re-evaluated the patient and provided an update on the results of her imaging. Her pain has improved after the muscle relaxant.       Ct Head Wo Contrast  Ct Cervical Spine Wo Contrast  02/04/2016  CLINICAL DATA:  Fall at home.  Initial encounter. EXAM: CT HEAD WITHOUT CONTRAST CT CERVICAL SPINE WITHOUT CONTRAST TECHNIQUE: Multidetector CT imaging of the head and cervical spine was performed following the standard protocol without intravenous contrast. Multiplanar CT image reconstructions of the cervical spine were also generated. COMPARISON:  03/05/2007 FINDINGS: CT HEAD FINDINGS Skull and Sinuses:Left parietal  scalp swelling without calvarial fracture. Visualized orbits: Scleral band on the right. No evidence of acute injury Brain: No evidence of acute infarction, hemorrhage, hydrocephalus, or mass lesion/mass effect. CT CERVICAL SPINE FINDINGS Negative for acute fracture or subluxation. No prevertebral edema. No gross cervical canal hematoma. 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 No evidence of high-grade canal stenosis. IMPRESSION: 1. No evidence of acute intracranial or cervical spine injury. 2. Left parietal scalp contusion without calvarial fracture. Electronically Signed   By: Monte Fantasia M.D.   On: 02/04/2016 02:07   Dg Knee Complete 4 Views Left  02/04/2016  CLINICAL DATA:  Fall yesterday with left knee pain. Initial encounter. EXAM: LEFT KNEE - COMPLETE 4+ VIEW COMPARISON:  None. FINDINGS: There is no evidence of fracture, dislocation, or joint effusion. 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 Osteopenia. IMPRESSION: 1. No acute finding. 2. 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 Electronically Signed   By: Monte Fantasia M.D.   On: 02/04/2016 01:44     I have personally reviewed and evaluated these images as part of my medical decision-making.    MDM   Final diagnoses:  Fall at home, initial encounter  Contusion of head, initial encounter  Trapezius muscle strain, right, initial encounter  Left knee pain   Discharge Medication List as of 02/04/2016  3:20 AM    START taking these medications   Details  naproxen (NAPROSYN) 250 MG tablet Take 1 po BID with food prn pain, Print    orphenadrine (NORFLEX) 100 MG tablet Take 1 tablet (100 mg total) by mouth 2 (two) times daily as needed (muscle soreness)., Starting 02/04/2016, Until Discontinued, Print        Plan discharge  Courtney Porter, MD, FACEP   I personally performed the services described in this documentation, which was scribed in my presence.  The recorded information has been reviewed and considered.  Courtney Porter, MD, Barbette Or, MD 02/04/16 [phone]

## 2016-02-17 ENCOUNTER — Encounter: Payer: Self-pay | Admitting: Family

## 2016-02-17 ENCOUNTER — Other Ambulatory Visit (INDEPENDENT_AMBULATORY_CARE_PROVIDER_SITE_OTHER): Payer: Medicare HMO

## 2016-02-17 DIAGNOSIS — E038 Other specified hypothyroidism: Secondary | ICD-10-CM | POA: Diagnosis not present

## 2016-02-17 LAB — TSH: TSH: 1.24 u[IU]/mL (ref 0.35–4.50)

## 2016-02-26 ENCOUNTER — Telehealth: Payer: Self-pay | Admitting: Family

## 2016-02-26 NOTE — Telephone Encounter (Signed)
Caller name:Self  Can be reached: [phone]  Pharmacy:  [id number] - [place] [phone] (Phone) [phone] (Fax)       Reason for call: refill thyroid (ARMOUR) 90 MG tablet [id number]   Patient needs to clarify what mg she should be taking because what she has is 97.5mg .

## 2016-02-27 ENCOUNTER — Telehealth: Payer: Self-pay | Admitting: Family

## 2016-02-27 MED ORDER — THYROID 97.5 MG PO TABS: 1.0000 | ORAL_TABLET | Freq: Every day | ORAL | Status: DC

## 2016-02-27 NOTE — Telephone Encounter (Signed)
See 02/27/16 phone note.

## 2016-02-27 NOTE — Telephone Encounter (Signed)
Spoke with pt and she states that the last prescriptions she has picked up from the pharmacy was for 97.5mg . States she never received the 90mg  Rx and would like to continue current dose of 97.5mg . States she has been on that dose for 3 months. Pt also requests 90 day supply.  Please advise.

## 2016-02-27 NOTE — Telephone Encounter (Signed)
°  Relation to [id number] Call back number:[phone] OR [phone] Pharmacy:  Reason for call: pt would like for you to give her a call states she has questions regarding her thyroid numbers, states to please call before 11:00 this morning if possible she states she is confused on the med dosage.

## 2016-02-27 NOTE — Telephone Encounter (Signed)
rx sent for 97.5.

## 2016-04-29 DIAGNOSIS — R69 Illness, unspecified: Secondary | ICD-10-CM | POA: Diagnosis not present

## 2016-05-18 ENCOUNTER — Encounter: Payer: Self-pay | Admitting: Family

## 2016-05-18 ENCOUNTER — Ambulatory Visit (INDEPENDENT_AMBULATORY_CARE_PROVIDER_SITE_OTHER): Payer: Medicare HMO | Admitting: Family

## 2016-05-18 VITALS — BP 118/78 | HR 46 | Temp 98.0°F | Resp 16 | Ht 65.0 in | Wt 195.8 lb

## 2016-05-18 DIAGNOSIS — I10 Essential (primary) hypertension: Secondary | ICD-10-CM

## 2016-05-18 DIAGNOSIS — E559 Vitamin D deficiency, unspecified: Secondary | ICD-10-CM | POA: Diagnosis not present

## 2016-05-18 DIAGNOSIS — G4730 Sleep apnea, unspecified: Secondary | ICD-10-CM | POA: Diagnosis not present

## 2016-05-18 DIAGNOSIS — G4733 Obstructive sleep apnea (adult) (pediatric): Secondary | ICD-10-CM | POA: Diagnosis not present

## 2016-05-18 LAB — BASIC METABOLIC PANEL
BUN: 12 mg/dL (ref 6–23)
CALCIUM: 9.7 mg/dL (ref 8.4–10.5)
CHLORIDE: 106 meq/L (ref 96–112)
CO2: 29 mEq/L (ref 19–32)
CREATININE: 0.76 mg/dL (ref 0.40–1.20)
GFR: 79.69 mL/min (ref >60.00)
Glucose, Bld: 88 mg/dL (ref 70–99)
Potassium: 4.1 mEq/L (ref 3.5–5.1)
Sodium: 143 mEq/L (ref 135–145)

## 2016-05-18 LAB — VITAMIN D 25 HYDROXY (VIT D DEFICIENCY, FRACTURES): VITD: 37.5 ng/mL (ref 30.00–100.00)

## 2016-05-18 NOTE — Assessment & Plan Note (Signed)
Clinically stable, Continue Thyroid.

## 2016-05-18 NOTE — Progress Notes (Signed)
Pre visit review using our clinic review tool, if applicable. No additional management support is needed unless otherwise documented below in the visit note. 

## 2016-05-18 NOTE — Assessment & Plan Note (Signed)
Reinforced importance of follow up with Dr. Elsworth Soho.  Will reinitiate referral.

## 2016-05-18 NOTE — Progress Notes (Signed)
Subjective:    Patient ID: [name], female    DOB: 1946-06-02, 70 y.o.   MRN: [id number]  HPI  [name] is a 70 yr old female who presents today for follow up.  1) HTN- maintained on lisinopril. Denies CP/SOB or swelling (unless she has had prolonged standing) BP Readings from Last 3 Encounters:  05/18/16 118/78  02/04/16 152/69  09/30/15 126/80   2) Hypothyroid- on Thyroid 97.5mg  once daily. Reports good energy.  Lab Results  Component Value Date   TSH 1.24 02/17/2016   3) Vit D deficiency- taking a daily otc supplement.   4) OSA-  A referral was made to Dr. Elsworth Soho (pulmonary) but patient declined to schedule when office called her.   Review of Systems    see hpi  Past Medical History:  Diagnosis Date  . Arthritis   . Asthma    mild  . H/O bronchitis   . Hx of blood clots 1967   from birth control in LUE  . Hypertension   . Hypoglycemia   . Hypothyroidism   . Migraine    from food  . Pneumonia    hx of  . Seasonal allergies   . Sleep apnea   . UTI (urinary tract infection)      Social History   Social History  . Marital status: Legally Separated    Spouse name: N/A  . Number of children: N/A  . Years of education: N/A   Occupational History  . Not on file.   Social History Main Topics  . Smoking status: Former Smoker    Packs/day: 3.00    Years: 13.00    Types: Cigarettes    Quit date: 10/11/1974  . Smokeless tobacco: Never Used  . Alcohol use No  . Drug use: No  . Sexual activity: Not on file   Other Topics Concern  . Not on file   Social History Narrative  . No narrative on file    Past Surgical History:  Procedure Laterality Date  . CATARACT EXTRACTION     rt eye  . COLONOSCOPY    . detatched retina  2003   rt eye  . 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   Procedure: RIGHT TOTAL HIP ARTHROPLASTY;  Surgeon: Tobi Bastos, MD;  Location: [facility];  Service: Orthopedics;  Laterality: Right;    Family History  Problem  Relation Age of Onset  . Rheum arthritis Mother   . Rheum arthritis Father   . Rheum arthritis Maternal Grandmother   . Rheum arthritis Maternal Grandfather   . Rheum arthritis Paternal Grandmother   . Thyroid disease Mother   . Thyroid disease Father     Allergies  Allergen Reactions  . Codeine Hives  . Pork-Derived Products   . Prednisone Other (See Comments)    Causes more pain    Current Outpatient Prescriptions on File Prior to Visit  Medication Sig Dispense Refill  . acetaminophen (TYLENOL) 500 MG tablet Take 500-1,000 mg by mouth as needed for mild pain or moderate pain.     . Ascorbic Acid (VITAMIN C) 1000 MG tablet Take 1,000 mg by mouth daily.    . Cholecalciferol (VITAMIN D) 2000 UNITS CAPS Take 2,000 Units by mouth daily.     . fexofenadine (ALLEGRA) 60 MG tablet Take 60 mg by mouth daily.    Marland Kitchen glucosamine-chondroitin 500-400 MG tablet Take 1 tablet by mouth 2 (two) times daily.    Marland Kitchen lisinopril (PRINIVIL,ZESTRIL) 20 MG  tablet TAKE 1 TABLET BY MOUTH EVERY NIGHT AT BEDTIME (Patient taking differently: TAKE 20 MG BY MOUTH EVERY NIGHT AT BEDTIME) 30 tablet 5  . Thyroid 97.5 MG TABS Take 1 tablet (97.5 mg total) by mouth daily. 90 tablet 1  . triamcinolone ointment (KENALOG) 0.1 % Apply 1 application topically 2 (two) times daily. (Patient taking differently: Apply 1 application topically 2 (two) times daily as needed (RASH). ) 90 g 1   No current facility-administered medications on file prior to visit.     BP 118/78   Pulse (!) 46   Temp 98 F (36.7 C) (Oral)   Resp 16   Ht 5\' 5"  (1.651 m)   Wt 195 lb 12.8 oz (88.8 kg)   SpO2 98% Comment: room air  BMI 32.58 kg/m    Objective:   Physical Exam  Constitutional: She appears well-developed and well-nourished.  Cardiovascular: Normal rate, regular rhythm and normal heart sounds.   No murmur heard. Trace bilateral LE edema.   Pulmonary/Chest: Effort normal and breath sounds normal. No respiratory distress. She has  no wheezes.  Psychiatric: She has a normal mood and affect. Her behavior is normal. Judgment and thought content normal.          Assessment & Plan:

## 2016-05-18 NOTE — Assessment & Plan Note (Signed)
Stable on current meds.  Continue same, obtain follow up bmet.  

## 2016-05-18 NOTE — Assessment & Plan Note (Signed)
Obtain follow up vit D level.  Continue supplement.

## 2016-05-18 NOTE — Patient Instructions (Signed)
Please complete lab work prior to leaving.  You will be contacted about scheduling an appointment with Dr. Elsworth Soho for your sleep apnea.

## 2016-07-16 ENCOUNTER — Ambulatory Visit (INDEPENDENT_AMBULATORY_CARE_PROVIDER_SITE_OTHER): Payer: Medicare HMO | Admitting: Pulmonary Disease

## 2016-07-16 ENCOUNTER — Encounter: Payer: Self-pay | Admitting: Pulmonary Disease

## 2016-07-16 DIAGNOSIS — G4733 Obstructive sleep apnea (adult) (pediatric): Secondary | ICD-10-CM | POA: Diagnosis not present

## 2016-07-16 DIAGNOSIS — Z9989 Dependence on other enabling machines and devices: Secondary | ICD-10-CM

## 2016-07-16 DIAGNOSIS — I10 Essential (primary) hypertension: Secondary | ICD-10-CM

## 2016-07-16 NOTE — Assessment & Plan Note (Signed)
Slight high today Recheck with PCP

## 2016-07-16 NOTE — Patient Instructions (Signed)
CPAP supplies will be renewed including-full face mask, hose and filters. We will ask [facility] to evaluate for new humidifier Download will be checked in one month after you restart using the machine

## 2016-07-16 NOTE — Progress Notes (Signed)
Subjective:    Patient ID: [name], female    DOB: 02/03/1946, 70 y.o.   MRN: [id number]  HPI   Chief Complaint  Patient presents with  . Sleep Apnea    Referred by Dr. Inda Castle; had Sleep Study done 2012, was on CPAP for a while, patient was having problems with runny nose with CPAP.  SD card has no data on it. ES: 31      70 year old Remote smoker, For management of obstructive sleep apnea  PSG 9/ 2012 showed total sleep time of 286 minutes with decreased slow wave sleep and very little REM noted. Sleep onset latency was normal at 6.5 minutes and REM onset was very prolonged at 345 minutes. Sleep efficiency was poor at 70%. 50 apneas and 27 obstructive hypopneas, AHI of 16 events per hour. The events occurred in all body positions and there was moderate snoring noted throughout - low desatn of 88%   She was started on CPAP 11 cm  with nasal pillows and Download 03/2012 shows ahi 4/h, excellent compliance > 7h, leak ++   She used CPAP until 2015 with good compliance. And she had hip surgery and was discharged to rehabilitation, they put saline solution in the humidifier and this caused her nasal congestion. She then stopped using her machine  She now reports increased daytime somnolence and fatigue, she also reports increased gasping episodes that wake her up from sleep She still has her machine but needs supplies for this  Bedtime is between 10 PM and midnight, depending on whether she has had a daytime nap are not. Sleep latency is minimal, she sleeps on her left side with one pillow but done so over on her back and this causes gasping episodes. She reports frequent nocturnal awakenings and is out of bed at 7 AM feeling tired with occasional dryness of mouth  There is no history suggestive of cataplexy, sleep paralysis or parasomnias  Blood pressure is slight high today and she denies headaches or blurry vision   Past Medical History:  Diagnosis Date  . Arthritis   .  Asthma    mild  . H/O bronchitis   . Hx of blood clots 1967   from birth control in LUE  . Hypertension   . Hypoglycemia   . Hypothyroidism   . Migraine    from food  . Pneumonia    hx of  . Seasonal allergies   . Sleep apnea   . UTI (urinary tract infection)     Past Surgical History:  Procedure Laterality Date  . CATARACT EXTRACTION     rt eye  . COLONOSCOPY    . detatched retina  2003   rt eye  . 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 Codeine Hives  . Pork-Derived Products   . Prednisone Other (See Comments)    Causes more pain     Social History   Social History  . Marital status: Legally Separated    Spouse name: N/A  . Number of children: N/A  . Years of education: N/A   Occupational History  . Not on file.   Social History Main Topics  . Smoking status: Former Smoker    Packs/day: 3.00    Years: 13.00    Types: Cigarettes    Quit date:  10/11/1974  . Smokeless tobacco: Never Used  . Alcohol use No  . Drug use: No  . Sexual activity: Not on file   Other Topics Concern  . Not on file   Social History Narrative  . No narrative on file     Family History  Problem Relation Age of Onset  . Rheum arthritis Mother   . Rheum arthritis Father   . Rheum arthritis Maternal Grandmother   . Rheum arthritis Maternal Grandfather   . Rheum arthritis Paternal Grandmother   . Thyroid disease Mother   . Thyroid disease Father      Review of Systems  Constitutional: Negative for chills, fever and unexpected weight change.  HENT: Negative for congestion, dental problem, ear pain, nosebleeds, postnasal drip, rhinorrhea, sinus pressure, sneezing, sore throat, trouble swallowing and voice change.   Eyes: Negative for visual disturbance.  Respiratory: Negative for cough, choking and  shortness of breath.   Cardiovascular: Negative for chest pain and leg swelling.  Gastrointestinal: Negative for abdominal pain, diarrhea and vomiting.  Genitourinary: Negative for difficulty urinating.  Musculoskeletal: Negative for arthralgias.  Skin: Negative for rash.  Neurological: Negative for tremors, syncope and headaches.  Hematological: Does not bruise/bleed easily.       Objective:   Physical Exam  Gen. Pleasant, obese, in no distress ENT - no lesions, no post nasal drip Neck: No JVD, no thyromegaly, no carotid bruits Lungs: no use of accessory muscles, no dullness to percussion, decreased without rales or rhonchi  Cardiovascular: Rhythm regular, heart sounds  normal, no murmurs or gallops, no peripheral edema Musculoskeletal: No deformities, no cyanosis or clubbing , no tremors       Assessment & Plan:

## 2016-07-16 NOTE — Assessment & Plan Note (Signed)
CPAP supplies will be renewed including-full face mask, hose and filters. We will ask [facility] to evaluate for new humidifier Download will be checked in one month after you restart using the machine  Weight loss encouraged, compliance with goal of at least 4-6 hrs every night is the expectation. Advised against medications with sedative side effects Cautioned against driving when sleepy - understanding that sleepiness will vary on a day to day basis

## 2016-07-21 ENCOUNTER — Ambulatory Visit (INDEPENDENT_AMBULATORY_CARE_PROVIDER_SITE_OTHER): Payer: Medicare HMO | Admitting: Behavioral Health

## 2016-07-21 DIAGNOSIS — Z23 Encounter for immunization: Secondary | ICD-10-CM

## 2016-07-28 ENCOUNTER — Telehealth: Payer: Self-pay | Admitting: Pulmonary Disease

## 2016-07-28 DIAGNOSIS — G4733 Obstructive sleep apnea (adult) (pediatric): Secondary | ICD-10-CM

## 2016-07-28 DIAGNOSIS — Z9989 Dependence on other enabling machines and devices: Secondary | ICD-10-CM

## 2016-07-28 NOTE — Telephone Encounter (Signed)
Patient calling to get CPAP supplies ordered. Order entered. Patient aware Nothing further needed.

## 2016-08-03 ENCOUNTER — Telehealth: Payer: Self-pay | Admitting: Pulmonary Disease

## 2016-08-03 NOTE — Telephone Encounter (Signed)
LMTCB

## 2016-08-04 NOTE — Telephone Encounter (Signed)
Spoke with pt. States that [facility] has not received her CPAP supply order. Advised her that we sent this order to [facility] on 07/28/16. I have called and left a message with Corene Cornea at [facility] to look in to this.

## 2016-08-04 NOTE — Telephone Encounter (Signed)
Patient returned call, CB is [phone]. States as of yesterday [facility] had not received the order.

## 2016-08-04 NOTE — Telephone Encounter (Signed)
Corene Cornea called from [facility], he said that pt will be contacted today regarding supplies-pr

## 2016-08-04 NOTE — Telephone Encounter (Signed)
Pt aware that she should be hearing from [facility] today. Pt to call 08/05/16 if not heard anything. Nothing further needed.

## 2016-08-05 ENCOUNTER — Telehealth: Payer: Self-pay | Admitting: Pulmonary Disease

## 2016-08-05 NOTE — Telephone Encounter (Signed)
lmtcb X1 for pt  

## 2016-08-05 NOTE — Telephone Encounter (Signed)
I am routing this to [facility] to trouble shoot since this is a DME issue

## 2016-08-05 NOTE — Telephone Encounter (Signed)
Spoke to pt and assured her I sent another message over to [facility] and as soon as I hear back from them I will call her and let her know something [name]

## 2016-08-05 NOTE — Telephone Encounter (Signed)
Patient returned call, CB is [phone]. She asked that we call back after 2 pm today.

## 2016-08-06 DIAGNOSIS — G4733 Obstructive sleep apnea (adult) (pediatric): Secondary | ICD-10-CM | POA: Diagnosis not present

## 2016-08-06 DIAGNOSIS — M169 Osteoarthritis of hip, unspecified: Secondary | ICD-10-CM | POA: Diagnosis not present

## 2016-08-06 NOTE — Telephone Encounter (Signed)
Pt did speak to [email or web address]  Joellen Jersey

## 2016-08-12 ENCOUNTER — Other Ambulatory Visit: Payer: Self-pay | Admitting: Family

## 2016-08-12 NOTE — Telephone Encounter (Signed)
Patient scheduled for CPE 11/2016. I have refilled Lisinopril #30 tablets x3 refills.TL/CMA

## 2016-08-26 ENCOUNTER — Other Ambulatory Visit: Payer: Self-pay | Admitting: Family

## 2016-08-26 MED ORDER — THYROID 97.5 MG PO TABS: 1.0000 | ORAL_TABLET | Freq: Every day | ORAL | 1 refills | Status: DC

## 2016-08-26 NOTE — Telephone Encounter (Signed)
Medication filled to pharmacy as requested.   

## 2016-08-26 NOTE — Telephone Encounter (Signed)
Self. Refill request for Thyroid 97.5 MG TABS     Pharmacy : [facility] [place], [place]

## 2016-09-06 ENCOUNTER — Other Ambulatory Visit: Payer: Self-pay

## 2016-09-06 ENCOUNTER — Telehealth: Payer: Self-pay | Admitting: Pulmonary Disease

## 2016-09-06 DIAGNOSIS — G4733 Obstructive sleep apnea (adult) (pediatric): Secondary | ICD-10-CM

## 2016-09-06 DIAGNOSIS — Z9989 Dependence on other enabling machines and devices: Secondary | ICD-10-CM

## 2016-09-06 NOTE — Telephone Encounter (Signed)
Spoke with pt. And she wanted to inform us that she did in fact receive the new parts for her machine and wanted to know if we needed a new download. I stated I would send to get a new download for RA to review and if he needed anything then we would follow up with her.  Order placed for download through her DME. Nothing further is needed at this time.

## 2016-09-19 ENCOUNTER — Encounter: Payer: Self-pay | Admitting: Pulmonary Disease

## 2016-09-30 ENCOUNTER — Telehealth: Payer: Self-pay | Admitting: Pulmonary Disease

## 2016-09-30 NOTE — Telephone Encounter (Signed)
Per Dr Elsworth Soho -  CPAP download showed that current pressure at 11 is very effective.  Very small leaks and showed good usage.   Can she improve the seal in any way? (tighten mask, clean mask/seal, etc) No change in settings at this time.

## 2016-10-01 NOTE — Telephone Encounter (Signed)
Spoke with pt. She is aware of CPAP download results. States that the leaking is coming from when she rolls over in bed. She tries to fix this as quickly as she can when she realizes that her mask has come unsealed. Nothing further was needed at this time.

## 2016-10-19 ENCOUNTER — Other Ambulatory Visit: Payer: Self-pay | Admitting: Family

## 2016-11-04 DIAGNOSIS — R69 Illness, unspecified: Secondary | ICD-10-CM | POA: Diagnosis not present

## 2016-11-22 ENCOUNTER — Encounter: Payer: Medicare HMO | Admitting: Family

## 2016-11-30 ENCOUNTER — Ambulatory Visit (INDEPENDENT_AMBULATORY_CARE_PROVIDER_SITE_OTHER): Payer: Medicare HMO | Admitting: Family

## 2016-11-30 ENCOUNTER — Other Ambulatory Visit (HOSPITAL_COMMUNITY)
Admission: RE | Admit: 2016-11-30 | Discharge: 2016-11-30 | Disposition: A | Discharge status: Home / Self Care | Payer: Medicare HMO | Source: Ambulatory Visit | Attending: Family | Admitting: Family

## 2016-11-30 ENCOUNTER — Telehealth: Payer: Self-pay | Admitting: Family

## 2016-11-30 ENCOUNTER — Encounter: Payer: Self-pay | Admitting: Family

## 2016-11-30 VITALS — BP 128/63 | HR 56 | Temp 98.1°F | Ht 67.0 in | Wt 202.0 lb

## 2016-11-30 DIAGNOSIS — Z1231 Encounter for screening mammogram for malignant neoplasm of breast: Secondary | ICD-10-CM | POA: Diagnosis not present

## 2016-11-30 DIAGNOSIS — Z0000 Encounter for general adult medical examination without abnormal findings: Secondary | ICD-10-CM | POA: Diagnosis not present

## 2016-11-30 DIAGNOSIS — Z1151 Encounter for screening for human papillomavirus (HPV): Secondary | ICD-10-CM | POA: Diagnosis present

## 2016-11-30 DIAGNOSIS — Z1239 Encounter for other screening for malignant neoplasm of breast: Secondary | ICD-10-CM

## 2016-11-30 DIAGNOSIS — R69 Illness, unspecified: Secondary | ICD-10-CM | POA: Diagnosis not present

## 2016-11-30 DIAGNOSIS — Z780 Asymptomatic menopausal state: Secondary | ICD-10-CM

## 2016-11-30 DIAGNOSIS — Z01419 Encounter for gynecological examination (general) (routine) without abnormal findings: Secondary | ICD-10-CM | POA: Diagnosis present

## 2016-11-30 DIAGNOSIS — I10 Essential (primary) hypertension: Secondary | ICD-10-CM | POA: Diagnosis not present

## 2016-11-30 DIAGNOSIS — E039 Hypothyroidism, unspecified: Secondary | ICD-10-CM

## 2016-11-30 DIAGNOSIS — Z124 Encounter for screening for malignant neoplasm of cervix: Secondary | ICD-10-CM

## 2016-11-30 DIAGNOSIS — Z1159 Encounter for screening for other viral diseases: Secondary | ICD-10-CM

## 2016-11-30 LAB — BASIC METABOLIC PANEL
BUN: 13 mg/dL (ref 6–23)
CALCIUM: 9.3 mg/dL (ref 8.4–10.5)
CO2: 29 meq/L (ref 19–32)
CREATININE: 0.74 mg/dL (ref 0.40–1.20)
Chloride: 106 mEq/L (ref 96–112)
GFR: 82.06 mL/min (ref >60.00)
GLUCOSE: 86 mg/dL (ref 70–99)
Potassium: 4 mEq/L (ref 3.5–5.1)
Sodium: 139 mEq/L (ref 135–145)

## 2016-11-30 LAB — TSH: TSH: 1.03 u[IU]/mL (ref 0.35–4.50)

## 2016-11-30 LAB — HEPATITIS C ANTIBODY: HCV Ab: NEGATIVE

## 2016-11-30 NOTE — Patient Instructions (Addendum)
Please complete lab work prior to leaving. Continue regular exercise such as walking. You should be contacted about scheduling your mammogram and bone density at [facility]. Create and bring a copy of your advance directives to your next office visit.  Advance Directive Advance directives are the legal documents that allow you to make choices about your health care and medical treatment if you cannot speak for yourself. Advance directives are a way for you to communicate your wishes to family, friends, and health care providers. The specified people can then convey your decisions about end-of-life care to avoid confusion if you should become unable to communicate. Ideally, the process of discussing and writing advance directives should happen over time rather than making decisions all at once. Advance directives can be modified as your situation changes, and you can change your mind at any time, even after you have signed the advance directives. Each state has its own laws regarding advance directives. You may want to check with your health care provider, attorney, or state representative about the law in your state. Below are some examples of advance directives. HEALTH CARE PROXY AND DURABLE POWER OF ATTORNEY FOR HEALTH CARE A health care proxy is a person (agent) appointed to make medical decisions for you if you cannot. Generally, people choose someone they know well and trust to represent their preferences when they can no longer do so. You should be sure to ask this person for agreement to act as your agent. An agent may have to exercise judgment in the event of a medical decision for which your wishes are not known. A durable power of attorney for health care is a legal document that names your health care proxy. Depending on the laws in your state, after the document is written, it may also need to be:  Signed.  Notarized.  Dated.  Copied.  Witnessed.  Incorporated into your medical  record. You may also want to appoint someone to manage your financial affairs if you cannot. This is called a durable power of attorney for finances. It is a separate legal document from the durable power of attorney for health care. You may choose the same person or someone different from your health care proxy to act as your agent in financial matters. LIVING WILL A living will is a set of instructions documenting your wishes about medical care when you cannot care for yourself. It is used if you become:  Terminally ill.  Incapacitated.  Unable to communicate.  Unable to make decisions. Items to consider in your living will include:  The use or non-use of life-sustaining equipment, such as dialysis machines and breathing machines (ventilators).  A do not resuscitate (DNR) order, which is the instruction not to use cardiopulmonary resuscitation (CPR) if breathing or heartbeat stops.  Tube feeding.  Withholding of food and fluids.  Comfort (palliative) care when the goal becomes comfort rather than a cure.  Organ and tissue donation. A living will does not give instructions about distribution of your money and property if you should pass away. It is advisable to seek the expert advice of a lawyer in drawing up a will regarding your possessions. Decisions about taxes, beneficiaries, and asset distribution will be legally binding. This process can relieve your family and friends of any burdens surrounding disputes or questions that may come up about the allocation of your assets. DO NOT RESUSCITATE (DNR) A do not resuscitate (DNR) order is a request to not have CPR in the event that your  heart stops beating or you stop breathing. Unless given other instructions, a health care provider will try to help any patient whose heart has stopped or who has stopped breathing.  This information is not intended to replace advice given to you by your health care provider. Make sure you discuss any  questions you have with your health care provider. Document Released: 01/04/2008 Document Revised: 01/19/2016 Document Reviewed: 02/14/2013 Elsevier Interactive Patient Education  2017 [employer].

## 2016-11-30 NOTE — Progress Notes (Signed)
Subjective:    Patient ID: [name], female    DOB: [birth year], 71 y.o.   MRN: [id number]  HPI  Patient presents today for complete physical.  Immunizations: up to date Diet: reports that her diet is improving. Wt Readings from Last 3 Encounters:  11/30/16 202 lb (91.6 kg)  07/16/16 200 lb 3.2 oz (90.8 kg)  05/18/16 195 lb 12.8 oz (88.8 kg)  Exercise:  Not exercising regularly, but some walking.  Colonoscopy: 8/09- normal per patient Dexa: she will complete at [facility] Pap Smear: 07/05/12, wants to complete pap Mammogram: 12/16/15   Review of Systems  Constitutional: Negative for unexpected weight change.  HENT: Negative for hearing loss.   Eyes: Negative for visual disturbance.  Cardiovascular:       Notes some occasional left leg swelling since she tore her meniscus on the left  Gastrointestinal:       Has diarrhea with "too much dairy"  Skin: Negative for rash.  Neurological: Negative for headaches.  Hematological: Negative for adenopathy.  Psychiatric/Behavioral:       Denies depression/anxiety   Past Medical History:  Diagnosis Date  . Arthritis   . Asthma    mild  . H/O bronchitis   . Hx of blood clots 1967   from birth control in LUE  . Hypertension   . Hypoglycemia   . Hypothyroidism   . Migraine    from food  . Pneumonia    hx of  . Seasonal allergies   . Sleep apnea   . UTI (urinary tract infection)      Social History   Social History  . Marital status: Legally Separated    Spouse name: N/A  . Number of children: N/A  . Years of education: N/A   Occupational History  . Not on file.   Social History Main Topics  . Smoking status: Former Smoker    Packs/day: 3.00    Years: 13.00    Types: Cigarettes    Quit date: 10/11/1974  . Smokeless tobacco: Never Used  . Alcohol use No  . Drug use: No  . Sexual activity: Not on file   Other Topics Concern  . Not on file   Social History Narrative  . No narrative on file    Past  Surgical History:  Procedure Laterality Date  . CATARACT EXTRACTION     rt eye  . COLONOSCOPY    . detatched retina  2003   rt eye  . 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 Rheum arthritis Mother   . Thyroid disease Mother   . Rheum arthritis Father   . Thyroid disease Father   . Rheum arthritis Maternal Grandmother   . Rheum arthritis Maternal Grandfather   . Rheum arthritis Paternal Grandmother     Allergies  Allergen Reactions  . Codeine Hives  . Pork-Derived Products   . Prednisone Other (See Comments)    Causes more pain    Current Outpatient Prescriptions on File Prior to Visit  Medication Sig Dispense Refill  . acetaminophen (TYLENOL) 500 MG tablet Take 500-1,000 mg by mouth as needed for mild pain or moderate pain.     . Ascorbic Acid (VITAMIN C) 1000 MG tablet Take 1,000 mg by mouth daily.    . Cholecalciferol (VITAMIN  D) 2000 UNITS CAPS Take 2,000 Units by mouth daily.     . fexofenadine (ALLEGRA) 60 MG tablet Take 60 mg by mouth daily.    Marland Kitchen glucosamine-chondroitin 500-400 MG tablet Take 1 tablet by mouth 2 (two) times daily.    Marland Kitchen lisinopril (PRINIVIL,ZESTRIL) 20 MG tablet TAKE 1 TABLET BY MOUTH EVERY NIGHT AT BEDTIME 30 tablet 0  . Thyroid 97.5 MG TABS Take 1 tablet (97.5 mg total) by mouth daily. 90 tablet 1  . triamcinolone ointment (KENALOG) 0.1 % Apply 1 application topically 2 (two) times daily. (Patient taking differently: Apply 1 application topically 2 (two) times daily as needed (RASH). ) 90 g 1   No current facility-administered medications on file prior to visit.     BP 128/63 (BP Location: Right Arm, Patient Position: Sitting, Cuff Size: Large)   Pulse (!) 56   Temp 98.1 F (36.7 C) (Oral)   Ht 5\' 7"  (1.702 m)   Wt 202 lb (91.6 kg)   SpO2 99%   BMI 31.64  kg/m       Objective:   Physical Exam Physical Exam  Constitutional: She is oriented to person, place, and time. She appears well-developed and well-nourished. No distress.  HENT:  Head: Normocephalic and atraumatic.  Right Ear: Tympanic membrane and ear canal normal.  Left Ear: Tympanic membrane and ear canal normal.  Mouth/Throat: Oropharynx is clear and moist.  Eyes: Pupils are equal, round, and reactive to light. No scleral icterus.  Neck: Normal range of motion. No thyromegaly present.  Cardiovascular: Normal rate and regular rhythm.   No murmur heard. Pulmonary/Chest: Effort normal and breath sounds normal. No respiratory distress. He has no wheezes. She has no rales. She exhibits no tenderness.  Abdominal: Soft. Bowel sounds are normal. She exhibits no distension and no mass. There is no tenderness. There is no rebound and no guarding.  Musculoskeletal: She exhibits no edema.  Lymphadenopathy:    She has no cervical adenopathy.  Neurological: She is alert and oriented to person, place, and time. She has normal patellar reflexes. She exhibits normal muscle tone. Coordination normal.  Skin: Skin is warm and dry.  Psychiatric: She has a normal mood and affect. Her behavior is normal. Judgment and thought content normal.  Breasts: Examined lying Right: Without masses, retractions, discharge or axillary adenopathy.  Left: Without masses, retractions, discharge or axillary adenopathy.  Inguinal/mons: Normal without inguinal adenopathy  External genitalia: Normal  BUS/Urethra/Skene's glands: Normal  Bladder: Normal  Vagina: Normal  Cervix: Normal  Uterus: normal in size, shape and contour. Midline and mobile  Adnexa/parametria:  Rt: Without masses or tenderness.  Lt: Without masses or tenderness.  Anus and perineum: Normal            Assessment & Plan:   Preventative care- discussed healthy diet, exercise and weight loss. Pap performed.  Obtain follow up lab work.  Immunizations reviewed and up to date.  Place order for mammogram.        Assessment & Plan:

## 2016-11-30 NOTE — Progress Notes (Signed)
Pre visit review using our clinic review tool, if applicable. No additional management support is needed unless otherwise documented below in the visit note. 

## 2016-11-30 NOTE — Telephone Encounter (Signed)
FYI  At check out pt wanted to change her pharmacy info.   She now use [facility] on [address] and [address].

## 2016-11-30 NOTE — Progress Notes (Signed)
Subjective:   [name] is a 71 y.o. female who presents for Medicare Annual (Subsequent) preventive examination.  Review of Systems:  No ROS.  Medicare Wellness Visit.  Cardiac Risk Factors include: advanced age (>65men, >52 women);obesity (BMI >30kg/m2)  Sleep patterns: no sleep issues, sleeps through the night, gets up 1-2 times nightly to void and sleeps 8 hours nightly. Wears CPAP 8 hrs nightly. Home Safety/Smoke Alarms: Feels safe in home. Smoke alarms in place.    Living environment; residence and Firearm Safety: Lives alone. 1-story house/ trailer, number of outside stairs: 3 for side door, 6 for front door, no firearms, firearms stored safely.  Counseling:  Eye exam-Follows w/ eye doctor off [address] once yearly. Due for exam.   Female:   Pap- Done today by PCP       Mammo- last 12/16/15. BI-RADS Category 1: Negative.      Dexa scan- last 08/01/14.  Normal.       CCS- last 05/13/08. hemorrhoids, otherwise normal. 10 year recall.     Objective:     Vitals: BP 128/63 (BP Location: Right Arm, Patient Position: Sitting, Cuff Size: Large)   Pulse (!) 56   Temp 98.1 F (36.7 C) (Oral)   Ht 5\' 7"  (1.702 m)   Wt 202 lb (91.6 kg)   SpO2 99%   BMI 31.64 kg/m   Body mass index is 31.64 kg/m.  Wt Readings from Last 3 Encounters:  11/30/16 202 lb (91.6 kg)  07/16/16 200 lb 3.2 oz (90.8 kg)  05/18/16 195 lb 12.8 oz (88.8 kg)   Tobacco History  Smoking Status  . Former Smoker  . Packs/day: 3.00  . Years: 13.00  . Types: Cigarettes  . Quit date: 10/11/1974  Smokeless Tobacco  . Never Used     Counseling given: Not Answered   Past Medical History:  Diagnosis Date  . Arthritis   . Asthma    mild  . H/O bronchitis   . Hx of blood clots 1967   from birth control in LUE  . Hypertension   . Hypoglycemia   . Hypothyroidism   . Migraine    from food  . Pneumonia    hx of  . Seasonal allergies   . Sleep apnea   . UTI (urinary tract infection)    Past  Surgical History:  Procedure Laterality Date  . CATARACT EXTRACTION     rt eye  . COLONOSCOPY    . detatched retina  2003   rt eye  . 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 Rheum arthritis Mother   . Thyroid disease Mother   . Rheum arthritis Father   . Thyroid disease Father   . Rheum arthritis Maternal Grandmother   . Rheum arthritis Maternal Grandfather   . Rheum arthritis Paternal Grandmother    History  Sexual Activity  . Sexual activity: No    Outpatient Encounter Prescriptions as of 11/30/2016  Medication Sig  . acetaminophen (TYLENOL) 500 MG tablet Take 500-1,000 mg by mouth as needed for mild pain or moderate pain.   . Ascorbic Acid (VITAMIN C) 1000 MG tablet Take 1,000 mg by mouth daily.  Marland Kitchen aspirin EC 81 MG tablet Take 81 mg by mouth daily.  . Cholecalciferol (VITAMIN D) 2000 UNITS CAPS Take 2,000 Units  by mouth daily.   . cyanocobalamin 1000 MCG tablet Take 1,000 mcg by mouth daily.  . fexofenadine (ALLEGRA) 60 MG tablet Take 60 mg by mouth daily.  Marland Kitchen glucosamine-chondroitin 500-400 MG tablet Take 1 tablet by mouth 2 (two) times daily.  Marland Kitchen lisinopril (PRINIVIL,ZESTRIL) 20 MG tablet TAKE 1 TABLET BY MOUTH EVERY NIGHT AT BEDTIME  . Thyroid 97.5 MG TABS Take 1 tablet (97.5 mg total) by mouth daily.  Marland Kitchen triamcinolone ointment (KENALOG) 0.1 % Apply 1 application topically 2 (two) times daily. (Patient taking differently: Apply 1 application topically 2 (two) times daily as needed (RASH). )   No facility-administered encounter medications on file as of 11/30/2016.     Activities of Daily Living In your present state of health, do you have any difficulty performing the following activities: 11/30/2016  Hearing? N  Vision? N  Difficulty concentrating or making decisions? N  Walking or  climbing stairs? N  Dressing or bathing? N  Doing errands, shopping? N  Preparing Food and eating ? N  Using the Toilet? N  In the past six months, have you accidently leaked urine? Y  Do you have problems with loss of bowel control? N  Managing your Medications? N  Managing your Finances? N  Housekeeping or managing your Housekeeping? N  Some recent data might be hidden    Patient Care Team: Debbrah Alar, NP as PCP - General (Internal Medicine) Latanya Maudlin, MD as Consulting Physician (Orthopedic Surgery) Rigoberto Noel, MD as Consulting Physician (Pulmonary Disease)    Assessment:    Physical assessment deferred to PCP.  Exercise Activities and Dietary recommendations Current Exercise Habits: The patient does not participate in regular exercise at present  Diet (meal preparation, eat out, water intake, caffeinated beverages, dairy products, fruits and vegetables): in general, a "healthy" diet  , well balanced, on average, 3 meals per day. Drinks at least 4 16 oz bottle of water daily. Drinks 1 low sodium V8 juice daily. Breakfast: cereal/oatmeal/grits and black coffee or smoothie Lunch: leftovers from night before Dinner: Beef once weekly, tuna once weekly, chicken most other nights. Vegetables w/ most meals.  Goals    . Increase physical activity    . Lose 25 lbs      Fall Risk Fall Risk  11/30/2016 11/19/2015 05/15/2015 03/13/2014 07/06/2013  Falls in the past year? No No No No No   Depression Screen PHQ 2/9 Scores 11/30/2016 11/19/2015 05/15/2015 03/13/2014  PHQ - 2 Score 0 0 0 0     Cognitive Function MMSE - Mini Mental State Exam 11/30/2016  Orientation to time 5  Orientation to Place 5  Registration 3  Attention/ Calculation 5  Recall 3  Language- name 2 objects 2  Language- repeat 1  Language- follow 3 step command 3  Language- read & follow direction 1  Write a sentence 1  Copy design 1  Total score 30        Immunization History  Administered Date(s)  Administered  . Influenza Split 07/20/2011, 07/05/2012  . Influenza Whole 08/31/2007, 07/18/2008, 07/17/2009, 07/09/2010  . Influenza, High Dose Seasonal PF 08/04/2013, 07/04/2015, 07/21/2016  . Influenza,inj,Quad PF,36+ Mos 08/06/2014  . Pneumococcal Conjugate-13 11/14/2014  . Pneumococcal Polysaccharide-23 09/27/2007, 11/19/2015  . Td 03/27/2008  . Zoster 11/02/2011   Screening Tests Health Maintenance  Topic Date Due  . Hepatitis C Screening  1945-12-18  . MAMMOGRAM  12/15/2017  . TETANUS/TDAP  03/27/2018  . COLONOSCOPY  05/13/2018  . INFLUENZA VACCINE  Completed  .  DEXA SCAN  Completed  . PNA vac Low Risk Adult  Completed      Plan:    Follow-up w/ PCP as directed.   Create and bring a copy of your advance directives to your next office visit.  Orders placed for bone density and Hep C screening.  During the course of the visit the patient was educated and counseled about the following appropriate screening and preventive services:   Vaccines to include Pneumoccal, Influenza, Hepatitis B, Td, Zostavax, HCV  Cardiovascular Disease  Colorectal cancer screening  Bone density screening  Diabetes screening  Glaucoma screening  Mammography/PAP  Nutrition counseling   Patient Instructions (the written plan) was given to the patient.   Dorrene German, RN  11/30/2016

## 2016-11-30 NOTE — Telephone Encounter (Signed)
Pharmacy list updated

## 2016-12-01 LAB — CYTOLOGY - PAP
Diagnosis: NEGATIVE
HPV: NOT DETECTED

## 2016-12-02 ENCOUNTER — Encounter: Payer: Self-pay | Admitting: Family

## 2016-12-03 NOTE — Progress Notes (Signed)
Noted and agree. 

## 2016-12-04 ENCOUNTER — Ambulatory Visit (INDEPENDENT_AMBULATORY_CARE_PROVIDER_SITE_OTHER): Payer: Medicare HMO | Admitting: Internal Medicine

## 2016-12-04 ENCOUNTER — Encounter: Payer: Self-pay | Admitting: Internal Medicine

## 2016-12-04 VITALS — BP 122/80 | HR 65 | Temp 98.5°F | Wt 204.0 lb

## 2016-12-04 DIAGNOSIS — M25562 Pain in left knee: Secondary | ICD-10-CM

## 2016-12-04 MED ORDER — IBUPROFEN 600 MG PO TABS: 600.0000 mg | ORAL_TABLET | Freq: Three times a day (TID) | ORAL | 0 refills | Status: DC | PRN

## 2016-12-04 NOTE — Progress Notes (Signed)
Pre visit review using our clinic review tool, if applicable. No additional management support is needed unless otherwise documented below in the visit note. 

## 2016-12-04 NOTE — Patient Instructions (Addendum)
You may have strained or torn another ligament in your knee   I have prescribed ibuprofen 600 mg you can take it up to 3 times daiy  Take 500 ,g or 623 mg  Of tylenol with each dose, up to 3 times daily  Ice the front and back of your knee for 15 minutes every 4 hours   Use your cane to offload your weight.    Ultrasound of knee has been been ordered  I recommend that you protect your stomach with nexium or prilosec whil e you are taking ibuprofen   Follow up with your PCP in one week

## 2016-12-04 NOTE — Progress Notes (Signed)
Subjective:  Patient ID: [name], female    DOB: 1946/04/29  Age: 71 y.o. MRN: [id number]  CC: The encounter diagnosis was Acute pain of left knee.  HPI [name] presents for evaluation of sudden onset of left knee pain which started 24 hours ago after twisting knee during a walk outside yesterday .  She has a History of meniscal tear, which has resulted in chronic pain involving the lateral side of her knee .  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  Underwent hip replacement in  2014 .   She reports knee pain involving the anterior patella ,  The medial and posterior side of knee, and feels that the knee has developed swelling posteriorly.   She states that she was nable to ice it immediately due  To running errands., but after using her cane for a few hours she developed worsening pain that radiated down her shin and up her thigh .  Used ice on back and front  And took one  advil yesterday .   Outpatient Medications Prior to Visit  Medication Sig Dispense Refill  . acetaminophen (TYLENOL) 500 MG tablet Take 500-1,000 mg by mouth as needed for mild pain or moderate pain.     . Ascorbic Acid (VITAMIN C) 1000 MG tablet Take 1,000 mg by mouth daily.    Marland Kitchen aspirin EC 81 MG tablet Take 81 mg by mouth daily.    . Cholecalciferol (VITAMIN D) 2000 UNITS CAPS Take 2,000 Units by mouth daily.     . cyanocobalamin 1000 MCG tablet Take 1,000 mcg by mouth daily.    . fexofenadine (ALLEGRA) 60 MG tablet Take 60 mg by mouth daily.    Marland Kitchen glucosamine-chondroitin 500-400 MG tablet Take 1 tablet by mouth 2 (two) times daily.    Marland Kitchen lisinopril (PRINIVIL,ZESTRIL) 20 MG tablet TAKE 1 TABLET BY MOUTH EVERY NIGHT AT BEDTIME 30 tablet 0  . Thyroid 97.5 MG TABS Take 1 tablet (97.5 mg total) by mouth daily. 90 tablet 1  . triamcinolone ointment (KENALOG) 0.1 % Apply 1 application topically 2 (two) times daily. (Patient taking differently: Apply 1  application topically 2 (two) times daily as needed (RASH). ) 90 g 1   No facility-administered medications prior to visit.     Review of Systems;  Patient denies headache, fevers, malaise, unintentional weight loss, skin rash, eye pain, sinus congestion and sinus pain, sore throat, dysphagia,  hemoptysis , cough, dyspnea, wheezing, chest pain, palpitations, orthopnea, edema, abdominal pain, nausea, melena, diarrhea, constipation, flank pain, dysuria, hematuria, urinary  Frequency, nocturia, numbness, tingling, seizures,  Focal weakness, Loss of consciousness,  Tremor, insomnia, depression, anxiety, and suicidal ideation.      Objective:  BP 122/80   Pulse 65   Temp 98.5 F (36.9 C) (Oral)   Wt 204 lb (92.5 kg)   SpO2 97%   BMI 31.95 kg/m   BP Readings from Last 3 Encounters:  12/04/16 122/80  11/30/16 128/63  07/16/16 (!) 150/86    Wt Readings from Last 3 Encounters:  12/04/16 204 lb (92.5 kg)  11/30/16 202 lb (91.6 kg)  07/16/16 200 lb 3.2 oz (90.8 kg)    General appearance: alert, cooperative and appears stated age Back: symmetric, no curvature. ROM normal. No CVA tenderness. Lungs: clear to auscultation bilaterally Heart: regular rate and rhythm, S1, S2 normal, no murmur, click, rub or gallop Ext: left knee enlarged,  No anterior effusion or tenderness,  Tender over posteriori gracilis insertion with palpaply tender swelling   Pulses: 2+ and symmetric Skin: Skin color, texture, turgor normal. No rashes or lesions Lymph nodes: Cervical, supraclavicular, and axillary nodes normal.  No results found for: HGBA1C  Lab Results  Component Value Date   CREATININE 0.74 11/30/2016   CREATININE 0.76 05/18/2016   CREATININE 0.71 11/19/2015    Lab Results  Component Value Date   WBC 3.9 (L) 05/15/2015   HGB 12.7 05/15/2015   HCT 37.5 05/15/2015   PLT 235.0 05/15/2015   GLUCOSE 86 11/30/2016   CHOL 176 11/14/2014   TRIG 164.0 (H) 11/14/2014   HDL 79.80 11/14/2014    LDLCALC 63 11/14/2014   ALT 10 11/14/2014   AST 18 11/14/2014   NA 139 11/30/2016   K 4.0 11/30/2016   CL 106 11/30/2016   CREATININE 0.74 11/30/2016   BUN 13 11/30/2016   CO2 29 11/30/2016   TSH 1.03 11/30/2016   INR 1.00 09/11/2013    No results found.  Assessment & Plan:   Problem List Items Addressed This Visit    Knee pain - Primary    Acute on chronic, left knee secondary to twisting incident,  There is no bruising to suggest  ligament tear so sprain is likely with possible baker's cyst . Ice, ibuprofen/tylenol tid ,  Ultrasound knee ordered for Monday . Advised to take prilosec while on daily NSAIDs but she is decidely against use of PPI'S despite a prolonged discussion today about their safety and value in short term gi prophylaxis .  Follow up with PCP or ortho in 2 weeks if no improvementn       Relevant Orders   US Venous Img Lower Unilateral Left     A total of 25 minutes of face to face time was spent with patient more than half of which was spent in counselling about the above mentioned conditions  and coordination of care  I am having [name] maintain her acetaminophen, fexofenadine, triamcinolone ointment, Vitamin D, vitamin C, glucosamine-chondroitin, Thyroid, lisinopril, cyanocobalamin, aspirin EC, and ibuprofen.  Meds ordered this encounter  Medications  . DISCONTD: ibuprofen (ADVIL,MOTRIN) 600 MG tablet    Sig: Take 1 tablet (600 mg total) by mouth every 8 (eight) hours as needed.    Dispense:  30 tablet    Refill:  0  . ibuprofen (ADVIL,MOTRIN) 600 MG tablet    Sig: Take 1 tablet (600 mg total) by mouth every 8 (eight) hours as needed.    Dispense:  30 tablet    Refill:  0    Medications Discontinued During This Encounter  Medication Reason  . ibuprofen (ADVIL,MOTRIN) 600 MG tablet Reorder    Follow-up: Return in about 1 year (around 12/04/2017) for with PCP .   Crecencio Mc, MD

## 2016-12-04 NOTE — Assessment & Plan Note (Addendum)
Acute on chronic, left knee secondary to twisting incident,  There is no bruising to suggest  ligament tear so sprain is likely with possible baker's cyst . Ice, ibuprofen/tylenol tid ,  Ultrasound knee ordered for Monday . Advised to take prilosec while on daily NSAIDs but she is decidely against use of PPI'S despite a prolonged discussion today about their safety and value in short term gi prophylaxis .  Follow up with PCP or ortho in 2 weeks if no improvementn

## 2016-12-30 DIAGNOSIS — Z1231 Encounter for screening mammogram for malignant neoplasm of breast: Secondary | ICD-10-CM | POA: Diagnosis not present

## 2016-12-30 DIAGNOSIS — Z780 Asymptomatic menopausal state: Secondary | ICD-10-CM | POA: Diagnosis not present

## 2016-12-30 LAB — HM DEXA SCAN: HM Dexa Scan: NORMAL

## 2016-12-30 LAB — HM MAMMOGRAPHY

## 2017-01-03 ENCOUNTER — Encounter: Payer: Self-pay | Admitting: Family

## 2017-01-06 ENCOUNTER — Encounter: Payer: Self-pay | Admitting: Family

## 2017-01-06 NOTE — Progress Notes (Signed)
Bone density report mailed to pt.

## 2017-01-14 ENCOUNTER — Other Ambulatory Visit: Payer: Self-pay | Admitting: Family

## 2017-02-14 ENCOUNTER — Other Ambulatory Visit: Payer: Self-pay | Admitting: Family

## 2017-02-15 NOTE — Telephone Encounter (Signed)
eScribe request from [facility] for refill on Nature-Throid 97.5 mg Last filled - 08/26/16, #90x1 Last TSH - 11/30/16, 1.03 Last AEX - 11/30/16 Next AEX - 6-Mths. Refill sent per [facility] refill protocol/SLS

## 2017-02-16 ENCOUNTER — Telehealth: Payer: Self-pay | Admitting: Family

## 2017-02-16 ENCOUNTER — Telehealth: Payer: Self-pay | Admitting: Medical

## 2017-02-16 ENCOUNTER — Ambulatory Visit (INDEPENDENT_AMBULATORY_CARE_PROVIDER_SITE_OTHER): Payer: Medicare HMO | Admitting: Medical

## 2017-02-16 ENCOUNTER — Encounter: Payer: Self-pay | Admitting: Medical

## 2017-02-16 VITALS — BP 130/59 | HR 55 | Temp 98.0°F | Resp 16 | Ht 67.0 in | Wt 206.0 lb

## 2017-02-16 DIAGNOSIS — R062 Wheezing: Secondary | ICD-10-CM | POA: Diagnosis not present

## 2017-02-16 DIAGNOSIS — J40 Bronchitis, not specified as acute or chronic: Secondary | ICD-10-CM

## 2017-02-16 DIAGNOSIS — R059 Cough, unspecified: Secondary | ICD-10-CM

## 2017-02-16 DIAGNOSIS — R05 Cough: Secondary | ICD-10-CM | POA: Diagnosis not present

## 2017-02-16 DIAGNOSIS — J0100 Acute maxillary sinusitis, unspecified: Secondary | ICD-10-CM | POA: Diagnosis not present

## 2017-02-16 DIAGNOSIS — J301 Allergic rhinitis due to pollen: Secondary | ICD-10-CM

## 2017-02-16 MED ORDER — LEVOCETIRIZINE DIHYDROCHLORIDE 5 MG PO TABS: 5.0000 mg | ORAL_TABLET | Freq: Every evening | ORAL | 0 refills | Status: DC

## 2017-02-16 MED ORDER — BENZONATATE 100 MG PO CAPS: 100.0000 mg | ORAL_CAPSULE | Freq: Three times a day (TID) | ORAL | 0 refills | Status: DC | PRN

## 2017-02-16 MED ORDER — DOXYCYCLINE MONOHYDRATE 100 MG PO CAPS: 100.0000 mg | ORAL_CAPSULE | Freq: Two times a day (BID) | ORAL | 0 refills | Status: DC

## 2017-02-16 MED ORDER — DOXYCYCLINE HYCLATE 100 MG PO TABS: 100.0000 mg | ORAL_TABLET | Freq: Two times a day (BID) | ORAL | 0 refills | Status: DC

## 2017-02-16 MED ORDER — ALBUTEROL SULFATE HFA 108 (90 BASE) MCG/ACT IN AERS: 2.0000 | INHALATION_SPRAY | Freq: Four times a day (QID) | RESPIRATORY_TRACT | 0 refills | Status: DC | PRN

## 2017-02-16 NOTE — Telephone Encounter (Signed)
rx sent.  I don't have a good alternative for the albuterol.

## 2017-02-16 NOTE — Telephone Encounter (Signed)
There is no blood to run it on, the patient has not had any labs drawn. KMP

## 2017-02-16 NOTE — Telephone Encounter (Signed)
Patient called back to inform provider that on the doxycycline, they need Monohydrate instead so that insurance will pay.

## 2017-02-16 NOTE — Patient Instructions (Addendum)
Recent allergic rhinitis flare can use your nasal spray astelin.   Also rx xyzal.(steroid nasal spray declined.)  For sinus infection and likely bronchitis. Rx doxycycline.  For cough rx benzonatate.  For wheezing rx albuterol. If you have to use inhaler every 6 hours please let us know.  If chest congestion persists or worsens then recommend cxr.  Follow up in 7-10 days or as needed

## 2017-02-16 NOTE — Telephone Encounter (Signed)
I reviewed last note. It looks like inadvertant entry about labs.Her last note mentioned nothing about labs.

## 2017-02-16 NOTE — Telephone Encounter (Signed)
[facility] pharmacy called in on pt's behalf. He says that pt's albuterol and doxycycline co-pay is to expensive. Pt would like to know if something less expensive could be prescribed?    Pharmacy: [phone]

## 2017-02-16 NOTE — Progress Notes (Signed)
Subjective:    Patient ID: [name], female    DOB: 1946-01-14, 71 y.o.   MRN: [id number]  HPI   Pt feeling sick for one week. Started out one week of nasal congestion. progressively worse sinus pain. Some pnd and recently productive cough.   Pt has been sneezing a lot and not sleeping due to cough.   Pt feeling fatigue now after struggling with this illness.   Pt has some history of asthma flares following allergies.    Review of Systems  Constitutional: Negative for chills, fatigue and fever.  HENT: Positive for congestion, sinus pain, sinus pressure and sneezing. Negative for ear discharge, ear pain and sore throat.   Respiratory: Positive for cough and wheezing. Negative for chest tightness and shortness of breath.        Slight wheeze just today.  Cardiovascular: Negative for chest pain and palpitations.  Gastrointestinal: Negative for abdominal pain.  Musculoskeletal: Negative for back pain.  Skin: Negative for rash.  Neurological: Negative for dizziness, speech difficulty, weakness, numbness and headaches.  Hematological: Negative for adenopathy. Does not bruise/bleed easily.  Psychiatric/Behavioral: Negative for behavioral problems and confusion.    Past Medical History:  Diagnosis Date  . Arthritis   . Asthma    mild  . H/O bronchitis   . Hx of blood clots 1967   from birth control in LUE  . Hypertension   . Hypoglycemia   . Hypothyroidism   . Migraine    from food  . Pneumonia    hx of  . Seasonal allergies   . Sleep apnea   . UTI (urinary tract infection)      Social History   Social History  . Marital status: Legally Separated    Spouse name: N/A  . Number of children: N/A  . Years of education: N/A   Occupational History  . Not on file.   Social History Main Topics  . Smoking status: Former Smoker    Packs/day: 3.00    Years: 13.00    Types: Cigarettes    Quit date: 10/11/1974  . Smokeless tobacco: Never Used  . Alcohol use No    . Drug use: No  . Sexual activity: No   Other Topics Concern  . Not on file   Social History Narrative  . No narrative on file    Past Surgical History:  Procedure Laterality Date  . CATARACT EXTRACTION     rt eye  . COLONOSCOPY    . detatched retina  2003   rt eye  . 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 Rheum arthritis Mother   . Thyroid disease Mother   . Rheum arthritis Father   . Thyroid disease Father   . Rheum arthritis Maternal Grandmother   . Rheum arthritis Maternal Grandfather   . Rheum arthritis Paternal Grandmother     Allergies  Allergen Reactions  . Codeine Hives  . Pork-Derived Products Other (See Comments)    Migraines w/ pork and other processed meats.  . Prednisone Other (See Comments)    Causes more pain    Current Outpatient Prescriptions on File Prior to Visit  Medication Sig Dispense Refill  . acetaminophen (TYLENOL) 500 MG tablet Take 500-1,000 mg by mouth as needed for mild pain or moderate pain.     Marland Kitchen  Ascorbic Acid (VITAMIN C) 1000 MG tablet Take 1,000 mg by mouth daily.    Marland Kitchen aspirin EC 81 MG tablet Take 81 mg by mouth daily.    . Cholecalciferol (VITAMIN D) 2000 UNITS CAPS Take 2,000 Units by mouth daily.     . cyanocobalamin 1000 MCG tablet Take 1,000 mcg by mouth daily.    . fexofenadine (ALLEGRA) 60 MG tablet Take 60 mg by mouth daily.    Marland Kitchen glucosamine-chondroitin 500-400 MG tablet Take 1 tablet by mouth 2 (two) times daily.    Marland Kitchen lisinopril (PRINIVIL,ZESTRIL) 20 MG tablet TAKE ONE TABLET BY MOUTH AT BEDTIME 30 tablet 5  . NATURE-THROID 97.5 MG TABS TAKE ONE TABLET BY MOUTH ONCE DAILY 90 tablet 1  . triamcinolone ointment (KENALOG) 0.1 % Apply 1 application topically 2 (two) times daily. (Patient taking differently: Apply 1 application topically 2  (two) times daily as needed (RASH). ) 90 g 1   No current facility-administered medications on file prior to visit.     BP (!) 130/59 (BP Location: Right Arm, Patient Position: Sitting, Cuff Size: Large)   Pulse (!) 55   Temp 98 F (36.7 C) (Oral)   Resp 16   Ht 5\' 7"  (1.702 m)   Wt 206 lb (93.4 kg)   SpO2 98%   BMI 32.26 kg/m       Objective:   Physical Exam  General  Mental Status - Alert. General Appearance - Well groomed. Not in acute distress.  Skin Rashes- No Rashes.  HEENT Head- Normal. Ear Auditory Canal - Left- Normal. Right - Normal.Tympanic Membrane- Left- Normal. Right- Normal. Eye Sclera/Conjunctiva- Left- Normal. Right- Normal. Nose & Sinuses Nasal Mucosa- Left-  Boggy and Congested. Right-  Boggy and  Congested.Bilateral maxillary pain on palpation but no frontal sinus pressure. Mouth & Throat Lips: Upper Lip- Normal: no dryness, cracking, pallor, cyanosis, or vesicular eruption. Lower Lip-Normal: no dryness, cracking, pallor, cyanosis or vesicular eruption. Buccal Mucosa- Bilateral- No Aphthous ulcers. Oropharynx- No Discharge or Erythema. +pnd Tonsils: Characteristics- Bilateral- No Erythema or Congestion. Size/Enlargement- Bilateral- No enlargement. Discharge- bilateral-None.  Neck Neck- Supple. No Masses.   Chest and Lung Exam Auscultation: Breath Sounds:-Clear even and unlabored.  Cardiovascular Auscultation:Rythm- Regular, rate and rhythm. Murmurs & Other Heart Sounds:Ausculatation of the heart reveal- No Murmurs.  Lymphatic Head & Neck General Head & Neck Lymphatics: Bilateral: Description- No Localized lymphadenopathy.       Assessment & Plan:  Recent allergic rhinitis flare can use your nasal spray astelin.   Also rx xyzal.(steroid nasal spray declined.  For sinus infection and likely bronchitis. Rx doxycycline.  For cough rx benzonatate.  For wheezing rx albuterol. If you have to use inhaler every 6 hours please let us  know.  Follow up in 7-10 days or as needed  Vanna Shavers, Percell Miller, [employer]

## 2017-02-16 NOTE — Telephone Encounter (Signed)
I added cbc to pt lab. Forgot to sign when she was in for visit. Can you still run that?

## 2017-02-16 NOTE — Progress Notes (Signed)
Pre visit review using our clinic review tool, if applicable. No additional management support is needed unless otherwise documented below in the visit note. 

## 2017-02-16 NOTE — Telephone Encounter (Signed)
Reviewed and appears pcp sent on doxy form better covered.

## 2017-02-17 NOTE — Telephone Encounter (Signed)
Pt called in to follow up. Made pt aware of message response per PCP.

## 2017-02-17 NOTE — Telephone Encounter (Signed)
I'm really confused by this note, is there something I need to address? KMP

## 2017-03-24 ENCOUNTER — Telehealth: Payer: Self-pay | Admitting: Family

## 2017-03-24 NOTE — Telephone Encounter (Signed)
Patient called concerned about dos: 5/9/118 she will billed at a speciality fee. I have resubmitted to coding for adjustment.

## 2017-04-25 ENCOUNTER — Telehealth: Payer: Self-pay | Admitting: *Deleted

## 2017-04-25 NOTE — Telephone Encounter (Signed)
Received PA request from [place]; initiated request for approval on Nature-Throid 97.5 mg tab, initiated via Cover My Meds; awaiting response and/or alternatives/SLS 07/16

## 2017-04-26 NOTE — Telephone Encounter (Signed)
Please advise pt that her insurance will not cover. She can go on good rx and print a coupon to pay cash which should make cash price affordable. If she does not wish to pay cash, let me know and we can switch her to synthroid.

## 2017-04-26 NOTE — Telephone Encounter (Signed)
Received denial from [facility] stating for drug to be covered under Medicare Part D:  -- it must have a New Drug Application (NDA) or Abbreviated New Drug Application (ANDA) on file with the FDA. NDA or ANDA means that the drug is FDA approved for both safety and effectiveness.  Unfortunately, your requested drug does not have NDA or ANDA on file with the FDA. Therefore, we cannot cover your drug.   Please advise?

## 2017-04-26 NOTE — Telephone Encounter (Signed)
Notified pt and she states she is aware and has been paying out of pocket for some time and is ok to continue doing that. Gave info about goodRx if she needs in in the future.

## 2017-05-05 DIAGNOSIS — R69 Illness, unspecified: Secondary | ICD-10-CM | POA: Diagnosis not present

## 2017-05-30 ENCOUNTER — Encounter: Payer: Self-pay | Admitting: Family

## 2017-05-30 ENCOUNTER — Ambulatory Visit (INDEPENDENT_AMBULATORY_CARE_PROVIDER_SITE_OTHER): Payer: Medicare HMO | Admitting: Family

## 2017-05-30 VITALS — BP 128/77 | HR 57 | Temp 98.4°F | Ht 67.0 in | Wt 205.0 lb

## 2017-05-30 DIAGNOSIS — F32A Depression, unspecified: Secondary | ICD-10-CM

## 2017-05-30 DIAGNOSIS — E039 Hypothyroidism, unspecified: Secondary | ICD-10-CM | POA: Diagnosis not present

## 2017-05-30 DIAGNOSIS — I10 Essential (primary) hypertension: Secondary | ICD-10-CM | POA: Diagnosis not present

## 2017-05-30 DIAGNOSIS — F419 Anxiety disorder, unspecified: Secondary | ICD-10-CM

## 2017-05-30 DIAGNOSIS — F329 Major depressive disorder, single episode, unspecified: Secondary | ICD-10-CM | POA: Diagnosis not present

## 2017-05-30 DIAGNOSIS — R69 Illness, unspecified: Secondary | ICD-10-CM | POA: Diagnosis not present

## 2017-05-30 DIAGNOSIS — J45909 Unspecified asthma, uncomplicated: Secondary | ICD-10-CM | POA: Diagnosis not present

## 2017-05-30 LAB — BASIC METABOLIC PANEL
BUN: 11 mg/dL (ref 6–23)
CHLORIDE: 104 meq/L (ref 96–112)
CO2: 30 meq/L (ref 19–32)
CREATININE: 0.79 mg/dL (ref 0.40–1.20)
Calcium: 9.4 mg/dL (ref 8.4–10.5)
GFR: 75.99 mL/min (ref >60.00)
GLUCOSE: 84 mg/dL (ref 70–99)
Potassium: 3.9 mEq/L (ref 3.5–5.1)
Sodium: 141 mEq/L (ref 135–145)

## 2017-05-30 LAB — TSH: TSH: 5.17 u[IU]/mL — ABNORMAL HIGH (ref 0.35–4.50)

## 2017-05-30 NOTE — Assessment & Plan Note (Signed)
Stable on current meds, continue same.  

## 2017-05-30 NOTE — Assessment & Plan Note (Signed)
Stable,  Monitor.

## 2017-05-30 NOTE — Patient Instructions (Signed)
Please complete lab work prior to leaving.   

## 2017-05-30 NOTE — Assessment & Plan Note (Signed)
Stable, not on meds. Monitor.

## 2017-05-30 NOTE — Assessment & Plan Note (Signed)
Clinically stable. Check TSH, continue nature-thyroid.

## 2017-05-30 NOTE — Progress Notes (Signed)
Subjective:    Patient ID: [name], female    DOB: [birth year], 71 y.o.   MRN: [id number]  HPI  [name] is a 71 yr old female who presents today for follow up.  1) Anxiety/depression- denies anxiety/depression symptoms.  2) HTN- continues lisinopril  Reports rare dry cough.  Doesn't bother her.  Hs hx of L meninscus tear and L ankle swelling, otherwise no swelling.   BP Readings from Last 3 Encounters:  05/30/17 128/77  02/16/17 (!) 130/59  12/04/16 122/80   3) Hypothyroid- on nature-thryoid. Energy is stable.   Lab Results  Component Value Date   TSH 1.03 11/30/2016   4) Asthma-  denies any recent asthma issues, generally only acts up with bronchitis.    Review of Systems See HPI  Past Medical History:  Diagnosis Date  . Arthritis   . Asthma    mild  . H/O bronchitis   . Hx of blood clots 1967   from birth control in LUE  . Hypertension   . Hypoglycemia   . Hypothyroidism   . Migraine    from food  . Pneumonia    hx of  . Seasonal allergies   . Sleep apnea   . UTI (urinary tract infection)      Social History   Social History  . Marital status: Legally Separated    Spouse name: N/A  . Number of children: N/A  . Years of education: N/A   Occupational History  . Not on file.   Social History Main Topics  . Smoking status: Former Smoker    Packs/day: 3.00    Years: 13.00    Types: Cigarettes    Quit date: 10/11/1974  . Smokeless tobacco: Never Used  . Alcohol use No  . Drug use: No  . Sexual activity: No   Other Topics Concern  . Not on file   Social History Narrative  . No narrative on file    Past Surgical History:  Procedure Laterality Date  . CATARACT EXTRACTION     rt eye  . COLONOSCOPY    . detatched retina  2003   rt eye  . 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 Rheum arthritis Mother   . Thyroid disease Mother   . Rheum arthritis Father   . Thyroid disease Father   . Rheum arthritis Maternal Grandmother   . Rheum arthritis Maternal Grandfather   . Rheum arthritis Paternal Grandmother     Allergies  Allergen Reactions  . Codeine Hives and Nausea And Vomiting  . Pork-Derived Products Other (See Comments)    Migraines w/ pork and other processed meats.  . Prednisone Other (See Comments)    Causes more pain    Current Outpatient Prescriptions on File Prior to Visit  Medication Sig Dispense Refill  . acetaminophen (TYLENOL) 500 MG tablet Take 500-1,000 mg by mouth as needed for mild pain or moderate pain.     . Ascorbic Acid (VITAMIN C) 1000 MG tablet Take 1,000 mg by mouth daily.    Marland Kitchen aspirin EC 81 MG tablet Take 81 mg by mouth daily.    . Cholecalciferol (VITAMIN D) 2000 UNITS CAPS Take 2,000 Units by mouth daily.     . cyanocobalamin 1000 MCG tablet Take 1,000  mcg by mouth daily.    Marland Kitchen doxycycline (VIBRA-TABS) 100 MG tablet Take 1 tablet (100 mg total) by mouth 2 (two) times daily. Can give caps or generic 20 tablet 0  . fexofenadine (ALLEGRA) 60 MG tablet Take 60 mg by mouth daily.    Marland Kitchen glucosamine-chondroitin 500-400 MG tablet Take 1 tablet by mouth 2 (two) times daily.    Marland Kitchen lisinopril (PRINIVIL,ZESTRIL) 20 MG tablet TAKE ONE TABLET BY MOUTH AT BEDTIME 30 tablet 5  . NATURE-THROID 97.5 MG TABS TAKE ONE TABLET BY MOUTH ONCE DAILY 90 tablet 1  . albuterol (PROVENTIL HFA;VENTOLIN HFA) 108 (90 Base) MCG/ACT inhaler Inhale 2 puffs into the lungs every 6 (six) hours as needed for wheezing or shortness of breath. (Patient not taking: Reported on 05/30/2017) 1 Inhaler 0   No current facility-administered medications on file prior to visit.     BP 128/77 (BP Location: Right Leg)   Pulse (!) 57   Temp 98.4 F (36.9 C) (Oral)   Ht 5\' 7"  (1.702 m)   Wt 205 lb (93 kg)   BMI 32.11 kg/m       Objective:    Physical Exam  Constitutional: She is oriented to person, place, and time. She appears well-developed and well-nourished.  HENT:  Head: Normocephalic and atraumatic.  Cardiovascular: Normal rate, regular rhythm and normal heart sounds.   No murmur heard. Pulmonary/Chest: Effort normal and breath sounds normal. No respiratory distress. She has no wheezes.  Musculoskeletal: She exhibits no edema.  Neurological: She is alert and oriented to person, place, and time.  Psychiatric: She has a normal mood and affect. Her behavior is normal. Judgment and thought content normal.          Assessment & Plan:

## 2017-05-31 ENCOUNTER — Telehealth: Payer: Self-pay | Admitting: Family

## 2017-05-31 DIAGNOSIS — E038 Other specified hypothyroidism: Secondary | ICD-10-CM

## 2017-05-31 MED ORDER — THYROID 113.75 MG PO TABS: 1.0000 | ORAL_TABLET | Freq: Every day | ORAL | 2 refills | Status: DC

## 2017-05-31 NOTE — Telephone Encounter (Signed)
Please let pt know that lab work shows that we need to increase her thyroid medication. I would like her to increase her nature thyroid to 113.75mg  once daily and repeat tsh in 6 weeks.

## 2017-06-01 MED ORDER — THYROID 113.75 MG PO TABS: 1.0000 | ORAL_TABLET | Freq: Every day | ORAL | 2 refills | Status: DC

## 2017-06-01 NOTE — Telephone Encounter (Signed)
Notified pt and she is agreeable to med change. Rx sent. Lab appt scheduled for 08/02/17 and future order entered.

## 2017-06-01 NOTE — Telephone Encounter (Signed)
Received fax from pharmacy that nature-throid is not available to order. Please advise in PCP absence?

## 2017-06-01 NOTE — Telephone Encounter (Signed)
Was she not getting it there before? OK to try Westhroid in same dose.

## 2017-06-10 NOTE — Telephone Encounter (Signed)
Pt called stating she went to pick up Westhroid at [facility] and they had filled levothroid. Per 06/01/17 office note, we sent Westhroid. Spoke with pharmacist and she states they never received Westhroid Rx and asked that we re-send it. Rx re-sent and pt has been notified.

## 2017-07-19 ENCOUNTER — Other Ambulatory Visit: Payer: Self-pay | Admitting: Family

## 2017-07-22 ENCOUNTER — Ambulatory Visit (INDEPENDENT_AMBULATORY_CARE_PROVIDER_SITE_OTHER): Payer: Medicare HMO

## 2017-07-22 DIAGNOSIS — Z23 Encounter for immunization: Secondary | ICD-10-CM | POA: Diagnosis not present

## 2017-07-28 ENCOUNTER — Ambulatory Visit: Payer: Medicare HMO | Admitting: Adult Health

## 2017-08-02 ENCOUNTER — Other Ambulatory Visit: Payer: Self-pay | Admitting: Family

## 2017-08-02 ENCOUNTER — Other Ambulatory Visit (INDEPENDENT_AMBULATORY_CARE_PROVIDER_SITE_OTHER): Payer: Medicare HMO

## 2017-08-02 DIAGNOSIS — E038 Other specified hypothyroidism: Secondary | ICD-10-CM

## 2017-08-02 LAB — TSH: TSH: 6.59 u[IU]/mL — ABNORMAL HIGH (ref 0.35–4.50)

## 2017-08-02 NOTE — Telephone Encounter (Signed)
Notified pt of below. She has history of pork related intolerance of migraines. Armour Thyroid contains pork products. Pt states she was on Armour Thyroid for a few years in the past and did not notice any increase of migraines during that time.  Ok to still send Rx?

## 2017-08-02 NOTE — Telephone Encounter (Signed)
Thyroid testing shows that thyroid medication needs increase.  Please increase rx to 130mg  once daily. Repeat tsh 6 weeks.

## 2017-08-03 MED ORDER — THYROID 130 MG PO TABS: 130.0000 mg | ORAL_TABLET | Freq: Every day | ORAL | 2 refills | Status: DC

## 2017-08-03 NOTE — Telephone Encounter (Signed)
Noted  

## 2017-08-03 NOTE — Telephone Encounter (Signed)
I changed to Westhroid which is what she has been taking.

## 2017-08-03 NOTE — Telephone Encounter (Signed)
Notified pt and Rx sent. Of note, westhroid and nature throid are also pork derived.

## 2017-08-09 MED ORDER — THYROID 130 MG PO TABS: 130.0000 mg | ORAL_TABLET | Freq: Every day | ORAL | 1 refills | Status: DC

## 2017-08-09 NOTE — Addendum Note (Signed)
Addended by: Kelle Darting A on: 08/09/2017 01:45 PM   Modules accepted: Orders

## 2017-08-09 NOTE — Telephone Encounter (Addendum)
Received fax from [facility] that [place] is unavailable and they are requesting that we change to different medication. Spoke with pt and she tells me that she never received previous Rx of Westhroid. When she went to pick it up the pharmacy had naturThroid that she had previously been taking. Spoke with Chrys Racer at [facility] and she confirmed that pt never received Westhroid. They filled Petra Kuba throid for pt most recently. She confirmed that medication was out of stock when they contacted [place] last. Not unavailable for order as they previously stated.  I requested that they do not send request to change medication when med is just out of stock; instead please re-order med from supplier. She voices understanding. I advised her to cancel Westhroid Rx and I would resend [facility] Rx that pt had been previously taking.  Notified pt and she voices understanding.

## 2017-08-25 ENCOUNTER — Ambulatory Visit: Payer: Medicare HMO | Admitting: Adult Health

## 2017-09-14 ENCOUNTER — Other Ambulatory Visit: Payer: Medicare HMO

## 2017-09-23 ENCOUNTER — Other Ambulatory Visit: Payer: Medicare HMO

## 2017-09-27 ENCOUNTER — Telehealth: Payer: Self-pay | Admitting: Family

## 2017-09-27 ENCOUNTER — Other Ambulatory Visit (INDEPENDENT_AMBULATORY_CARE_PROVIDER_SITE_OTHER): Payer: Medicare HMO

## 2017-09-27 DIAGNOSIS — E038 Other specified hypothyroidism: Secondary | ICD-10-CM

## 2017-09-27 LAB — TSH: TSH: 0.17 u[IU]/mL — ABNORMAL LOW (ref 0.35–4.50)

## 2017-09-27 MED ORDER — THYROID 113.75 MG PO TABS: 1.0000 | ORAL_TABLET | Freq: Every day | ORAL | 2 refills | Status: DC

## 2017-09-27 NOTE — Telephone Encounter (Signed)
Lab work shows we should decrease thyroid medication. Rx sent for 113.75mg  tabs. Repeat TSH in 6 weeks.

## 2017-10-03 ENCOUNTER — Telehealth: Payer: Self-pay

## 2017-10-03 NOTE — Telephone Encounter (Signed)
Patient takes approximately 2 grains of thyroid supplements, equivalent to approximately 150 mcg of levothyroxine. Plan: Switch to  Levothyroxine, 150 mcg 1 p.o. daily #30 and no refills. Schedule TSH in 1 month

## 2017-10-03 NOTE — Telephone Encounter (Signed)
Copied from [place] [id number]. Topic: General - Other >> Oct 03, 2017  8:51 AM Yvette Rack wrote: Reason for CRM: patient states that the Thyroid (NATURE-THROID) 113.75 MG is on back order for 6 months at [facility] on [address] she wants to know if the Doctor want to change it to something else

## 2017-10-03 NOTE — Telephone Encounter (Signed)
Please advise in PCP absence.  

## 2017-10-03 NOTE — Telephone Encounter (Signed)
Okay, will forward this  to PCP who can discuss this with the patient on a nonurgent basis.

## 2017-10-03 NOTE — Telephone Encounter (Signed)
Spoke w/ Pt, informed of recommendations. Pt stated she didn't want to take "that synthetic crap." Informed her that really there are not many options left. Informed I would inform covering provider and PCP (whom is out today). Pt states has enough medications to last until Sunday 10/09/2017 to give [place] time to "figure it out."

## 2017-10-05 MED ORDER — THYROID 90 MG PO TABS: 90.0000 mg | ORAL_TABLET | Freq: Every day | ORAL | 2 refills | Status: DC

## 2017-10-05 NOTE — Telephone Encounter (Signed)
Recommend change to armour thyroid.  Repeat in 1 month. Refill pended.

## 2017-10-05 NOTE — Telephone Encounter (Signed)
Spoke w/ Pt, informed of recommendations. Rx sent to [facility]. Pt has lab appt scheduled for next month.

## 2017-10-12 ENCOUNTER — Telehealth: Payer: Self-pay | Admitting: Family

## 2017-10-12 NOTE — Telephone Encounter (Signed)
Copied from [place] [id number]. Topic: Quick Communication - See Telephone Encounter >> Oct 12, 2017 10:48 AM Hewitt Shorts wrote: CRM for notification. See Telephone encounter for: pt has questions about her armour that she picked up yesterday from the pharmacy  She states she needs to confirm the dosages  Best number [phone] pt states it ok to leave a message   10/12/17.

## 2017-10-12 NOTE — Telephone Encounter (Signed)
Called pt. back. No answer and no voicemail.

## 2017-11-08 ENCOUNTER — Other Ambulatory Visit: Payer: Medicare HMO

## 2017-11-09 DIAGNOSIS — R69 Illness, unspecified: Secondary | ICD-10-CM | POA: Diagnosis not present

## 2017-12-02 ENCOUNTER — Encounter: Payer: Self-pay | Admitting: Family

## 2017-12-02 ENCOUNTER — Ambulatory Visit (INDEPENDENT_AMBULATORY_CARE_PROVIDER_SITE_OTHER): Payer: Medicare HMO | Admitting: Family

## 2017-12-02 VITALS — BP 141/69 | HR 59 | Temp 98.2°F | Resp 16 | Ht 67.0 in | Wt 206.6 lb

## 2017-12-02 DIAGNOSIS — E039 Hypothyroidism, unspecified: Secondary | ICD-10-CM

## 2017-12-02 DIAGNOSIS — E785 Hyperlipidemia, unspecified: Secondary | ICD-10-CM | POA: Diagnosis not present

## 2017-12-02 DIAGNOSIS — M1990 Unspecified osteoarthritis, unspecified site: Secondary | ICD-10-CM

## 2017-12-02 DIAGNOSIS — G4733 Obstructive sleep apnea (adult) (pediatric): Secondary | ICD-10-CM

## 2017-12-02 DIAGNOSIS — I10 Essential (primary) hypertension: Secondary | ICD-10-CM | POA: Diagnosis not present

## 2017-12-02 DIAGNOSIS — Z0000 Encounter for general adult medical examination without abnormal findings: Secondary | ICD-10-CM

## 2017-12-02 LAB — LIPID PANEL
CHOLESTEROL: 160 mg/dL (ref 0–200)
HDL: 90.4 mg/dL (ref >39.00)
LDL Cholesterol: 49 mg/dL (ref 0–99)
NonHDL: 69.94
TRIGLYCERIDES: 103 mg/dL (ref 0.0–149.0)
Total CHOL/HDL Ratio: 2
VLDL: 20.6 mg/dL (ref 0.0–40.0)

## 2017-12-02 LAB — HEPATIC FUNCTION PANEL
ALBUMIN: 3.8 g/dL (ref 3.5–5.2)
ALK PHOS: 60 U/L (ref 39–117)
ALT: 8 U/L (ref 0–35)
AST: 14 U/L (ref 0–37)
Bilirubin, Direct: 0 mg/dL (ref 0.0–0.3)
TOTAL PROTEIN: 6.6 g/dL (ref 6.0–8.3)
Total Bilirubin: 0.5 mg/dL (ref 0.2–1.2)

## 2017-12-02 LAB — BASIC METABOLIC PANEL
BUN: 11 mg/dL (ref 6–23)
CO2: 29 meq/L (ref 19–32)
Calcium: 9.4 mg/dL (ref 8.4–10.5)
Chloride: 106 mEq/L (ref 96–112)
Creatinine, Ser: 0.8 mg/dL (ref 0.40–1.20)
GFR: 74.79 mL/min (ref >60.00)
GLUCOSE: 82 mg/dL (ref 70–99)
POTASSIUM: 3.9 meq/L (ref 3.5–5.1)
SODIUM: 142 meq/L (ref 135–145)

## 2017-12-02 LAB — TSH: TSH: 0.3 u[IU]/mL — ABNORMAL LOW (ref 0.35–4.50)

## 2017-12-02 MED ORDER — ZOSTER VAC RECOMB ADJUVANTED 50 MCG/0.5ML IM SUSR: INTRAMUSCULAR | 1 refills | Status: DC

## 2017-12-02 NOTE — Progress Notes (Signed)
Subjective:    Patient ID: [name], female    DOB: [birth year], 72 y.o.   MRN: [id number]  HPI   Patient presents today for complete physical.  Immunizations: tetanus 2009, flu shot up to date Diet: needs to cook more Wt Readings from Last 3 Encounters:  12/02/17 206 lb 9.6 oz (93.7 kg)  05/30/17 205 lb (93 kg)  02/16/17 206 lb (93.4 kg)  Exercise:  Walks some but not formal exercise Colonoscopy: 2009 Dexa:  Up to date Pap Smear: 2/18  Mammogram:  01/03/17  OSA- uses cpap  Hypothryoid- maintained on armour thyroid.  HTN- continue bp medication   Review of Systems  Constitutional: Negative for unexpected weight change.  HENT: Negative for hearing loss and rhinorrhea.   Eyes: Negative for visual disturbance.  Respiratory: Negative for cough and shortness of breath.   Cardiovascular: Negative for chest pain.       Reports some left leg swelling which she attributes to left knee swelling  Gastrointestinal: Negative for blood in stool, diarrhea, nausea and vomiting.  Genitourinary: Negative for hematuria.  Musculoskeletal: Positive for arthralgias.  Skin: Negative for rash.  Neurological: Negative for headaches.  Hematological: Negative for adenopathy.  Psychiatric/Behavioral:       Denies depression/anxiety   Past Medical History:  Diagnosis Date  . Arthritis   . Asthma    mild  . H/O bronchitis   . Hx of blood clots 1967   from birth control in LUE  . Hypertension   . Hypoglycemia   . Hypothyroidism   . Migraine    from food  . Pneumonia    hx of  . Seasonal allergies   . Sleep apnea   . UTI (urinary tract infection)      Social History   Socioeconomic History  . Marital status: Legally Separated    Spouse name: Not on file  . Number of children: Not on file  . Years of education: Not on file  . Highest education level: Not on file  Social Needs  . Financial resource strain: Not on file  . Food insecurity - worry: Not on file  . Food  insecurity - inability: Not on file  . Transportation needs - medical: Not on file  . Transportation needs - non-medical: Not on file  Occupational History  . Not on file  Tobacco Use  . Smoking status: Former Smoker    Packs/day: 3.00    Years: 13.00    Pack years: 39.00    Types: Cigarettes    Last attempt to quit: 10/11/1974    Years since quitting: 43.1  . Smokeless tobacco: Never Used  Substance and Sexual Activity  . Alcohol use: No    Alcohol/week: 0.0 oz  . Drug use: No  . Sexual activity: No  Other Topics Concern  . Not on file  Social History Narrative  . Not on file    Past Surgical History:  Procedure Laterality Date  . CATARACT EXTRACTION     rt eye  . COLONOSCOPY    . detatched retina  2003   rt eye  . 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 Rheum arthritis Mother   . Thyroid disease Mother   . Rheum arthritis Father   . Thyroid disease Father   .  Rheum arthritis Maternal Grandmother   . Rheum arthritis Maternal Grandfather   . Rheum arthritis Paternal Grandmother     Allergies  Allergen Reactions  . Codeine Hives and Nausea And Vomiting  . Pork-Derived Products Other (See Comments)    Migraines w/ pork and other processed meats.  . Prednisone Other (See Comments)    Causes more pain    Current Outpatient Medications on File Prior to Visit  Medication Sig Dispense Refill  . acetaminophen (TYLENOL) 500 MG tablet Take 500-1,000 mg by mouth as needed for mild pain or moderate pain.     Marland Kitchen albuterol (PROVENTIL HFA;VENTOLIN HFA) 108 (90 Base) MCG/ACT inhaler Inhale 2 puffs into the lungs every 6 (six) hours as needed for wheezing or shortness of breath. 1 Inhaler 0  . Ascorbic Acid (VITAMIN C) 1000 MG tablet Take 1,000 mg by mouth daily.    Marland Kitchen aspirin EC 81 MG  tablet Take 81 mg by mouth daily.    . Cholecalciferol (VITAMIN D) 2000 UNITS CAPS Take 2,000 Units by mouth 2 (two) times daily.     . cyanocobalamin 1000 MCG tablet Take 1,000 mcg by mouth daily.    Marland Kitchen lisinopril (PRINIVIL,ZESTRIL) 20 MG tablet TAKE 1 TABLET BY MOUTH AT BEDTIME 90 tablet 1  . thyroid (ARMOUR THYROID) 90 MG tablet Take 1 tablet (90 mg total) by mouth daily. 30 tablet 2  . fexofenadine (ALLEGRA) 60 MG tablet Take 60 mg by mouth daily.    Marland Kitchen glucosamine-chondroitin 500-400 MG tablet Take 1 tablet by mouth 2 (two) times daily.     No current facility-administered medications on file prior to visit.     BP (!) 141/69 (BP Location: Left Arm, Patient Position: Sitting, Cuff Size: Large)   Pulse (!) 59   Temp 98.2 F (36.8 C) (Oral)   Resp 16   Ht 5\' 7"  (1.702 m)   Wt 206 lb 9.6 oz (93.7 kg)   SpO2 99%   BMI 32.36 kg/m       Objective:   Physical Exam Physical Exam  Constitutional: She is oriented to person, place, and time. She appears well-developed and well-nourished. No distress.  HENT:  Head: Normocephalic and atraumatic.  Right Ear: Tympanic membrane and ear canal normal.  Left Ear: Tympanic membrane and ear canal normal.  Mouth/Throat: Oropharynx is clear and moist.  Eyes: Pupils are equal, round, and reactive to light. No scleral icterus.  Neck: Normal range of motion. No thyromegaly present.  Cardiovascular: Normal rate and regular rhythm.   No murmur heard. Pulmonary/Chest: Effort normal and breath sounds normal. No respiratory distress. He has no wheezes. She has no rales. She exhibits no tenderness.  Abdominal: Soft. Bowel sounds are normal. She exhibits no distension and no mass. There is no tenderness. There is no rebound and no guarding.  Musculoskeletal: She exhibits no edema.  Lymphadenopathy:    She has no cervical adenopathy.  Neurological: She is alert and oriented to person, place, and time.  She exhibits normal muscle tone. Coordination normal.   Skin: Skin is warm and dry.  Psychiatric: She has a normal mood and affect. Her behavior is normal. Judgment and thought content normal.  Breasts: Examined lying Right: Without masses, retractions, discharge or axillary adenopathy.  Left: Without masses, retractions, discharge or axillary adenopathy.  Pelvic: deferred          Assessment & Plan:    Preventative care- discussed healthy diet, exercise weight loss. Rx sent to pharmacy for shingrix. Due for  tetanus but not covered by insurance so she wishes to hold off. Flu shot up to date, pap up to date. She will schedule [facility] at [facility].    OSA- refer to pulmonology for follow up of her cpap.  Hypothyroid- obtain TSH, continue armour thyroid.  HTN- BP acceptable, continue current dose of lisinopril.  BP Readings from Last 3 Encounters:  12/02/17 (!) 141/69  05/30/17 128/77  02/16/17 (!) 130/59         Assessment & Plan:

## 2017-12-02 NOTE — Patient Instructions (Addendum)
Please schedule mammogram with [facility] after 01/03/18. You should be contacted about scheduling your appointment with GI for colonoscopy. Please complete lab work prior to leaving.

## 2017-12-04 ENCOUNTER — Other Ambulatory Visit: Payer: Self-pay | Admitting: Family

## 2017-12-04 DIAGNOSIS — E038 Other specified hypothyroidism: Secondary | ICD-10-CM

## 2017-12-04 NOTE — Telephone Encounter (Signed)
Thyroid med too strong, decrease armour thyroid from 90 to 65mg  and repeat tsh in 6 weeks.

## 2017-12-05 ENCOUNTER — Other Ambulatory Visit: Payer: Self-pay

## 2017-12-05 DIAGNOSIS — E038 Other specified hypothyroidism: Secondary | ICD-10-CM

## 2017-12-05 MED ORDER — THYROID 65 MG PO TABS: 65.0000 mg | ORAL_TABLET | Freq: Every day | ORAL | 3 refills | Status: DC

## 2017-12-05 MED ORDER — THYROID 65 MG PO TABS: 65.0000 mg | ORAL_TABLET | Freq: Every day | ORAL | 2 refills | Status: DC

## 2017-12-05 NOTE — Telephone Encounter (Signed)
Patient advised of rx change. Order entered for labs in 6 wks.

## 2017-12-06 ENCOUNTER — Telehealth: Payer: Self-pay | Admitting: Family

## 2017-12-06 NOTE — Telephone Encounter (Signed)
Copied from [place] [id number]. Topic: Quick Communication - See Telephone Encounter >> Dec 06, 2017 10:49 AM Burnis Medin, NT wrote: CRM for notification. See Telephone encounter for: Patient called and said that the medication thyroid (ARMOUR) 65 MG tablet that the  doctor wanted her to take pharmacy doesn't make that dosage. Pt asked if her original dosage of 90 mg be called into [facility], [place] [phone] (Phone) [phone] (Fax)    12/06/17.

## 2017-12-07 NOTE — Telephone Encounter (Signed)
Spoke with pt and advised reason for dose change. Reviewed fax from pharmacy that Armour thyroid is only available in doses of 15, 30, 60 and 90 mg tablets.  Please advise?

## 2017-12-07 NOTE — Telephone Encounter (Signed)
See pt. Request. Thanks. 

## 2017-12-07 NOTE — Telephone Encounter (Signed)
Also pt states she was starting to feel better on the 90 mg, and she doesn't knpw why this was changed to 65 mg anyway. Pt has one pill left for tomorrow.

## 2017-12-07 NOTE — Telephone Encounter (Signed)
Her lab work showed that the medication is a bit too strong for her.  Since she is feeling well but have her continue her current dose for another month and then repeat her TSH.

## 2017-12-07 NOTE — Telephone Encounter (Signed)
Notified pt. She has 1 refill left on current 90mg  rx and will request that. Lab appt has been scheduled for 01/03/18 at 10:30am. Notified Betty at [facility] to d/c 65mg  Rx and pt will continue 90mg .

## 2017-12-22 ENCOUNTER — Encounter: Payer: Self-pay | Admitting: Adult Health

## 2017-12-22 ENCOUNTER — Ambulatory Visit: Payer: Medicare HMO | Admitting: Adult Health

## 2017-12-22 DIAGNOSIS — G4733 Obstructive sleep apnea (adult) (pediatric): Secondary | ICD-10-CM | POA: Diagnosis not present

## 2017-12-22 DIAGNOSIS — Z9989 Dependence on other enabling machines and devices: Secondary | ICD-10-CM | POA: Diagnosis not present

## 2017-12-22 DIAGNOSIS — E669 Obesity, unspecified: Secondary | ICD-10-CM | POA: Diagnosis not present

## 2017-12-22 NOTE — Assessment & Plan Note (Signed)
Wt loss  

## 2017-12-22 NOTE — Patient Instructions (Signed)
Continue on CPAP At bedtime   Keep up the good work Work on [employer] Do not drive a sleepy Follow-up in 1 year with Dr. Elsworth Soho and as needed

## 2017-12-22 NOTE — Progress Notes (Signed)
@Patient  ID: [name], female    DOB: 10/25/1945, 72 y.o.   MRN: [id number]  Chief Complaint  Patient presents with  . Follow-up    OSA     Referring provider: Debbrah Alar, NP  HPI: 72 year old female former smoker followed for obstructive sleep apnea  TEST  Sleep study 07/2011 >AHI of 16/hr   12/22/2017 Follow up ; OSA  Patient returns for a yearly follow-up for sleep apnea.  Patient is on CPAP at bedtime.  Patient says she is doing well.  Feels rested with no significant daytime sleepiness.  Download shows excellent compliance with average usage at 8 hours.  Patient is on CPAP 11 Summers H2O.  AHI is 3.1.  Minimum leaks.  Needs new supplies sent to her home care company   Allergies  Allergen Reactions  . Codeine Hives and Nausea And Vomiting  . Pork-Derived Products Other (See Comments)    Migraines w/ pork and other processed meats.  . Prednisone Other (See Comments)    Causes more pain    Immunization History  Administered Date(s) Administered  . Influenza Split 07/20/2011, 07/05/2012  . Influenza Whole 08/31/2007, 07/18/2008, 07/17/2009, 07/09/2010  . Influenza, High Dose Seasonal PF 08/04/2013, 07/04/2015, 07/21/2016, 07/22/2017  . Influenza,inj,Quad PF,6+ Mos 08/06/2014  . Pneumococcal Conjugate-13 11/14/2014  . Pneumococcal Polysaccharide-23 09/27/2007, 11/19/2015  . Td 03/27/2008  . Zoster 11/02/2011    Past Medical History:  Diagnosis Date  . Arthritis   . Asthma    mild  . H/O bronchitis   . Hx of blood clots 1967   from birth control in LUE  . Hypertension   . Hypoglycemia   . Hypothyroidism   . Migraine    from food  . Pneumonia    hx of  . Seasonal allergies   . Sleep apnea   . UTI (urinary tract infection)     Tobacco History: Social History   Tobacco Use  Smoking Status Former Smoker  . Packs/day: 3.00  . Years: 13.00  . Pack years: 39.00  . Types: Cigarettes  . Last attempt to quit: 10/11/1974  . Years since  quitting: 43.2  Smokeless Tobacco Never Used   Counseling given: Not Answered   Outpatient Encounter Medications as of 12/22/2017  Medication Sig  . acetaminophen (TYLENOL) 500 MG tablet Take 500-1,000 mg by mouth as needed for mild pain or moderate pain.   Marland Kitchen albuterol (PROVENTIL HFA;VENTOLIN HFA) 108 (90 Base) MCG/ACT inhaler Inhale 2 puffs into the lungs every 6 (six) hours as needed for wheezing or shortness of breath.  . Ascorbic Acid (VITAMIN C) 1000 MG tablet Take 1,000 mg by mouth daily.  Marland Kitchen aspirin EC 81 MG tablet Take 81 mg by mouth daily.  . Cholecalciferol (VITAMIN D) 2000 UNITS CAPS Take 2,000 Units by mouth 2 (two) times daily.   . cyanocobalamin 1000 MCG tablet Take 1,000 mcg by mouth daily.  . fexofenadine (ALLEGRA) 60 MG tablet Take 60 mg by mouth daily.  Marland Kitchen lisinopril (PRINIVIL,ZESTRIL) 20 MG tablet TAKE 1 TABLET BY MOUTH AT BEDTIME  . thyroid (ARMOUR) 90 MG tablet Take 90 mg by mouth daily.  Marland Kitchen Zoster Vaccine Adjuvanted [facility]) injection Inject 0.5mg  IM now and again in 2-6 months.  . [DISCONTINUED] glucosamine-chondroitin 500-400 MG tablet Take 1 tablet by mouth 2 (two) times daily.   No facility-administered encounter medications on file as of 12/22/2017.      Review of Systems  Constitutional:   No  weight loss, night  sweats,  Fevers, chills, fatigue, or  lassitude.  HEENT:   No headaches,  Difficulty swallowing,  Tooth/dental problems, or  Sore throat,                No sneezing, itching, ear ache, nasal congestion, post nasal drip,   CV:  No chest pain,  Orthopnea, PND, swelling in lower extremities, anasarca, dizziness, palpitations, syncope.   GI  No heartburn, indigestion, abdominal pain, nausea, vomiting, diarrhea, change in bowel habits, loss of appetite, bloody stools.   Resp: No shortness of breath with exertion or at rest.  No excess mucus, no productive cough,  No non-productive cough,  No coughing up of blood.  No change in color of mucus.  No  wheezing.  No chest wall deformity  Skin: no rash or lesions.  GU: no dysuria, change in color of urine, no urgency or frequency.  No flank pain, no hematuria   MS:  No joint pain or swelling.  No decreased range of motion.  No back pain.    Physical Exam  BP 128/84 (BP Location: Left Arm, Cuff Size: Normal)   Pulse 82   Ht 5\' 5"  (1.651 m)   Wt 200 lb (90.7 kg)   SpO2 96%   BMI 33.28 kg/m   GEN: A/Ox3; pleasant , NAD, obese    HEENT:  [place]/AT,  EACs-clear, TMs-wnl, NOSE-clear, THROAT-clear, no lesions, no postnasal drip or exudate noted.   NECK:  Supple w/ fair ROM; no JVD; normal carotid impulses w/o bruits; no thyromegaly or nodules palpated; no lymphadenopathy.    RESP  Clear  P & A; w/o, wheezes/ rales/ or rhonchi. no accessory muscle use, no dullness to percussion  CARD:  RRR, no m/r/g, no peripheral edema, pulses intact, no cyanosis or clubbing.  GI:   Soft & nt; nml bowel sounds; no organomegaly or masses detected.   Musco: Warm bil, no deformities or joint swelling noted.   Neuro: alert, no focal deficits noted.    Skin: Warm, no lesions or rashes    Lab Results:  CBC  BNP No results found for: BNP  ProBNP No results found for: PROBNP  Imaging: No results found.   Assessment & Plan:   OSA on CPAP Doing well on CPAP at bedtime  Plan  Patient Instructions  Continue on CPAP At bedtime   Keep up the good work Work on healthy weight Do not drive a sleepy Follow-up in 1 year with Dr. Elsworth Soho and as needed      Obesity (BMI 30-39.9) Wt loss      Rexene Edison, NP 12/22/2017

## 2017-12-22 NOTE — Assessment & Plan Note (Signed)
Doing well on CPAP at bedtime  Plan  Patient Instructions  Continue on CPAP At bedtime   Keep up the good work Work on healthy weight Do not drive a sleepy Follow-up in 1 year with Dr. Elsworth Soho and as needed

## 2017-12-23 ENCOUNTER — Ambulatory Visit (INDEPENDENT_AMBULATORY_CARE_PROVIDER_SITE_OTHER): Payer: Medicare HMO | Admitting: Family Medicine

## 2017-12-23 ENCOUNTER — Encounter: Payer: Self-pay | Admitting: Family Medicine

## 2017-12-23 VITALS — BP 120/78 | HR 64 | Temp 99.5°F | Ht 65.0 in | Wt 203.5 lb

## 2017-12-23 DIAGNOSIS — J301 Allergic rhinitis due to pollen: Secondary | ICD-10-CM

## 2017-12-23 MED ORDER — METHYLPREDNISOLONE ACETATE 80 MG/ML IJ SUSP
80.0000 mg | Freq: Once | INTRAMUSCULAR | Status: AC
  Administered 2017-12-23: 80 mg via INTRAMUSCULAR

## 2017-12-23 NOTE — Addendum Note (Signed)
Addended by: Sharon Seller B on: 12/23/2017 12:48 PM   Modules accepted: Orders

## 2017-12-23 NOTE — Patient Instructions (Addendum)
Continue to push fluids, practice good hand hygiene, and cover your mouth if you cough.  If you start having fevers, shaking or shortness of breath, seek immediate care.  OK to stop using Mucinex for now.   Go back on Allegra.  Optional: Flonase (fluticasone); nasal spray that is over the counter. 2 sprays each nostril, once daily. Aim towards the same side eye when you spray.  This is available OTC, and the generic version, which may be cheaper, is in parentheses. Show this to a pharmacist if you have trouble finding any of these items.  Let us know if you need anything.

## 2017-12-23 NOTE — Progress Notes (Signed)
Pre visit review using our clinic review tool, if applicable. No additional management support is needed unless otherwise documented below in the visit note. 

## 2017-12-23 NOTE — Progress Notes (Addendum)
Chief Complaint  Patient presents with  . Sinusitis  . Cough    [name] here for URI complaints.  Duration: 2 days  Associated symptoms: sinus congestion, rhinorrhea, itchy watery eyes, sore throat and cough Denies: ear pain, ear drainage, wheezing, shortness of breath, myalgia and fever Treatment to date: Mucinex, had run out of Allegra just before this started also.  Sick contacts: No   ROS:  Const: Denies fevers HEENT: As noted in HPI Lungs: No SOB  Past Medical History:  Diagnosis Date  . Arthritis   . Asthma    mild  . H/O bronchitis   . Hx of blood clots 1967   from birth control in LUE  . Hypertension   . Hypoglycemia   . Hypothyroidism   . Migraine    from food  . Pneumonia    hx of  . Seasonal allergies   . Sleep apnea   . UTI (urinary tract infection)      BP 120/78 (BP Location: Left Arm, Patient Position: Sitting, Cuff Size: Large)   Pulse 64   Temp 99.5 F (37.5 C) (Oral)   Ht 5\' 5"  (1.651 m)   Wt 203 lb 8 oz (92.3 kg)   SpO2 95%   BMI 33.86 kg/m  General: Awake, alert, appears stated age HEENT: AT, [place], ears patent b/l and TM's neg, +ttp over max sinuses b/l, nares patent w/o discharge, pharynx pink and without exudates, MMM Neck: No masses or asymmetry Heart: RRR Lungs: CTAB, no accessory muscle use Psych: Age appropriate judgment and insight, normal mood and affect  Seasonal allergic rhinitis due to pollen  Steroid injection today. Reviewed "allergy" with patient. Sounds like [name] had fluid retention which caused increased pressure and thus pain for her orthopedic issue at that time. I reviewed her chart and see [name] has been on a Pred taper in 2012 without issue. [name] did deny swelling in her mouth/throat, hives or other allergic issue with prednisone.  Go back on Allegra.  Cont supportive care. INCS info discussed and written down if Allegra is not as helpful.  F/u prn. If starting to experience fevers, shaking, or shortness of  breath, seek immediate care. Pt voiced understanding and agreement to the plan.  [place], DO 12/23/17 11:50 AM

## 2017-12-28 ENCOUNTER — Telehealth: Payer: Self-pay | Admitting: Adult Health

## 2017-12-28 DIAGNOSIS — G4733 Obstructive sleep apnea (adult) (pediatric): Secondary | ICD-10-CM

## 2017-12-28 DIAGNOSIS — Z9989 Dependence on other enabling machines and devices: Secondary | ICD-10-CM

## 2017-12-28 NOTE — Telephone Encounter (Signed)
Pt calling to check status of cpap supplies order.  Pt saw TP on 3/14 and TP documented in note to order cpap supplies, but this was not documented in AVS and was not ordered.  New order placed for cpap supplies.  Nothing further needed.

## 2018-01-02 ENCOUNTER — Telehealth: Payer: Self-pay | Admitting: Adult Health

## 2018-01-02 NOTE — Telephone Encounter (Signed)
Order placed on 3/20 & was sent to [facility] on 3/21.  I called today & verified with Corene Cornea that they do have the order.  Called pt & made her aware.  Nothing further needed.

## 2018-01-03 ENCOUNTER — Other Ambulatory Visit: Payer: Medicare HMO

## 2018-01-04 ENCOUNTER — Telehealth: Payer: Self-pay | Admitting: *Deleted

## 2018-01-04 DIAGNOSIS — Z1231 Encounter for screening mammogram for malignant neoplasm of breast: Secondary | ICD-10-CM | POA: Diagnosis not present

## 2018-01-04 NOTE — Telephone Encounter (Signed)
Results have been printed and left at the front office for pt to pick up.  Left detailed message on pt's voicemail and to call if any questions / concerns.   Copied from [place]. Topic: General - Other >> Jan 04, 2018  8:30 AM Burnis Medin, NT wrote: Reason for CRM: Patient called and said she is coming in the office in the morning and wanted to get a copy of lab results from her cpe.

## 2018-01-05 ENCOUNTER — Other Ambulatory Visit (INDEPENDENT_AMBULATORY_CARE_PROVIDER_SITE_OTHER): Payer: Medicare HMO

## 2018-01-05 ENCOUNTER — Telehealth: Payer: Self-pay | Admitting: *Deleted

## 2018-01-05 DIAGNOSIS — E038 Other specified hypothyroidism: Secondary | ICD-10-CM | POA: Diagnosis not present

## 2018-01-05 LAB — TSH: TSH: 0.32 u[IU]/mL — ABNORMAL LOW (ref 0.35–4.50)

## 2018-01-05 MED ORDER — THYROID 60 MG PO TABS: 60.0000 mg | ORAL_TABLET | Freq: Every day | ORAL | 1 refills | Status: DC

## 2018-01-05 NOTE — Telephone Encounter (Signed)
TSH shows thryoid dose is too high. Decrease to Armour thyroid 60 mg, repeat tsh in 6 weeks. rx sent. f

## 2018-01-05 NOTE — Telephone Encounter (Signed)
Patient wishes to wait to schedule lab appointment until she gets her rx. States she will call back to schedule.

## 2018-01-05 NOTE — Telephone Encounter (Signed)
Pt in office for TSH level today. States she will be out of thyroid meds on Sunday and is requesting [place] to send refill in before the weekend.  Please advise?

## 2018-01-07 LAB — HM MAMMOGRAPHY

## 2018-01-11 DIAGNOSIS — G4733 Obstructive sleep apnea (adult) (pediatric): Secondary | ICD-10-CM | POA: Diagnosis not present

## 2018-01-11 DIAGNOSIS — M169 Osteoarthritis of hip, unspecified: Secondary | ICD-10-CM | POA: Diagnosis not present

## 2018-01-17 NOTE — Telephone Encounter (Signed)
Pt has scheduled lab appointment for 02/23/18. Future order has been placed. No current future orders are open. Order that was placed in December has expired.

## 2018-01-19 ENCOUNTER — Other Ambulatory Visit: Payer: Self-pay | Admitting: Family

## 2018-02-23 ENCOUNTER — Other Ambulatory Visit (INDEPENDENT_AMBULATORY_CARE_PROVIDER_SITE_OTHER): Payer: Medicare HMO

## 2018-02-23 DIAGNOSIS — E038 Other specified hypothyroidism: Secondary | ICD-10-CM

## 2018-02-23 LAB — TSH: TSH: 2.47 u[IU]/mL (ref 0.35–4.50)

## 2018-02-27 ENCOUNTER — Other Ambulatory Visit: Payer: Self-pay

## 2018-02-27 ENCOUNTER — Telehealth: Payer: Self-pay | Admitting: Family

## 2018-02-27 MED ORDER — THYROID 60 MG PO TABS: 60.0000 mg | ORAL_TABLET | Freq: Every day | ORAL | 1 refills | Status: DC

## 2018-02-27 NOTE — Telephone Encounter (Signed)
See result note.  

## 2018-02-27 NOTE — Telephone Encounter (Signed)
Copied from [place] [phone]. Topic: Quick Communication - Lab Results >> Feb 27, 2018  9:26 AM Ronny Flurry, CMA wrote: See lab result note form 02/23/18. OK for [facility] / Triage to discuss with patient.

## 2018-03-27 ENCOUNTER — Ambulatory Visit (INDEPENDENT_AMBULATORY_CARE_PROVIDER_SITE_OTHER): Payer: Medicare HMO | Admitting: Family Medicine

## 2018-03-27 ENCOUNTER — Encounter: Payer: Self-pay | Admitting: Family Medicine

## 2018-03-27 VITALS — BP 142/74 | HR 80 | Temp 97.8°F | Resp 16 | Ht 65.0 in | Wt 205.0 lb

## 2018-03-27 DIAGNOSIS — H8110 Benign paroxysmal vertigo, unspecified ear: Secondary | ICD-10-CM

## 2018-03-27 DIAGNOSIS — J0110 Acute frontal sinusitis, unspecified: Secondary | ICD-10-CM

## 2018-03-27 MED ORDER — MECLIZINE HCL 12.5 MG PO TABS: 12.5000 mg | ORAL_TABLET | Freq: Three times a day (TID) | ORAL | 0 refills | Status: DC | PRN

## 2018-03-27 MED ORDER — AMOXICILLIN 500 MG PO CAPS: 1000.0000 mg | ORAL_CAPSULE | Freq: Two times a day (BID) | ORAL | 0 refills | Status: DC

## 2018-03-27 NOTE — Progress Notes (Signed)
[place] at [facility] [address], [place], [place] [address] [phone]  Date:  03/27/2018   Name:  [name]   DOB:  07/11/1946   MRN:  [id number]  PCP:  Debbrah Alar, NP    Chief Complaint: Dizziness and both ears feels stopped up   History of Present Illness:  [name] is a 72 y.o. very pleasant female patient who presents with the following:  Pt of Melissa who I have not seen in the past History of obesity, HTN, hypothyroidism  She notes dizziness- notes that when she stands up she will feel a sensation of movement.  She has noted dizziness for 4-5 days.  Sx occur with movement- if she stands still it will go away withing 10- 25 seconds  If she lays down in bed and rolls over she will have the sx  Mild frontal HA only- no worse ha of life She has noted sinus congestion for 4-5 days also No fever  No other neuro sx such as slurred speech, difficulty walking, numbness or weakness in any part of her body   Today she is nauseated, but no vomiting  She did have "inner ear infections years ago" what would cause similar sx - she took meclizine for these   She is on a thyroid med and BP med- no other rx  BP Readings from Last 3 Encounters:  03/27/18 (!) 142/74  12/23/17 120/78  12/22/17 128/84    Patient Active Problem List   Diagnosis Date Noted  . Obesity (BMI 30-39.9) 12/22/2017  . Vitamin D deficiency 05/18/2016  . Constipation 09/25/2013  . Acute blood loss anemia 09/20/2013  . Osteoarthritis of right hip 09/19/2013  . 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 Heel pain 02/16/2013  . Knee pain 07/05/2012  . Bilateral bunions 07/05/2012  . Screening for malignant neoplasm of the cervix 07/05/2012  . Physical exam 07/05/2012  . Sinusitis 02/18/2012  . Anxiety and depression 11/02/2011  . OSA on CPAP 11/02/2011  . Asthma 08/12/2010  . PANIC DISORDER 05/07/2009  . HYPERTENSION,  BENIGN 12/05/2008  . Hypothyroidism 03/27/2008  . ARTHRITIS 03/27/2008  . Allergic rhinitis, cause unspecified 08/26/2007    Past Medical History:  Diagnosis Date  . Arthritis   . Asthma    mild  . H/O bronchitis   . Hx of blood clots 1967   from birth control in LUE  . Hypertension   . Hypoglycemia   . Hypothyroidism   . Migraine    from food  . Pneumonia    hx of  . Seasonal allergies   . Sleep apnea   . UTI (urinary tract infection)     Past Surgical History:  Procedure Laterality Date  . CATARACT EXTRACTION     rt eye  . COLONOSCOPY    . detatched retina  2003   rt eye  . 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   Procedure: RIGHT TOTAL HIP ARTHROPLASTY;  Surgeon: Tobi Bastos, MD;  Location: [facility];  Service: Orthopedics;  Laterality: Right;    Social History   Tobacco Use  . Smoking status: Former Smoker    Packs/day: 3.00    Years: 13.00    Pack years: 39.00    Types: Cigarettes    Last attempt to quit: 10/11/1974    Years since quitting: 43.4  . Smokeless tobacco: Never Used  Substance Use Topics  . Alcohol use: No  Alcohol/week: 0.0 oz  . Drug use: No    Family History  Problem Relation Age of Onset  . Rheum arthritis Mother   . Thyroid disease Mother   . Rheum arthritis Father   . Thyroid disease Father   . Rheum arthritis Maternal Grandmother   . Rheum arthritis Maternal Grandfather   . Rheum arthritis Paternal Grandmother     Allergies  Allergen Reactions  . Codeine Hives and Nausea And Vomiting  . Pork-Derived Products Other (See Comments)    Migraines w/ pork and other processed meats.  . Prednisone Other (See Comments)    Causes more pain    Medication list has been reviewed and updated.  Current Outpatient Medications on File Prior to Visit  Medication Sig Dispense Refill  . acetaminophen (TYLENOL) 500 MG tablet Take 500-1,000 mg by mouth as needed for mild pain or moderate pain.     Marland Kitchen albuterol (PROVENTIL HFA;VENTOLIN  HFA) 108 (90 Base) MCG/ACT inhaler Inhale 2 puffs into the lungs every 6 (six) hours as needed for wheezing or shortness of breath. 1 Inhaler 0  . Ascorbic Acid (VITAMIN C) 1000 MG tablet Take 1,000 mg by mouth daily.    Marland Kitchen aspirin EC 81 MG tablet Take 81 mg by mouth daily.    . Cholecalciferol (VITAMIN D) 2000 UNITS CAPS Take 2,000 Units by mouth 2 (two) times daily.     . cyanocobalamin 1000 MCG tablet Take 1,000 mcg by mouth daily.    . fexofenadine (ALLEGRA) 60 MG tablet Take 60 mg by mouth daily.    Marland Kitchen lisinopril (PRINIVIL,ZESTRIL) 20 MG tablet TAKE 1 TABLET BY MOUTH AT BEDTIME 90 tablet 1  . thyroid (ARMOUR THYROID) 60 MG tablet Take 1 tablet (60 mg total) by mouth daily before breakfast. 30 tablet 1  . Zoster Vaccine Adjuvanted [facility]) injection Inject 0.5mg  IM now and again in 2-6 months. 0.5 mL 1   No current facility-administered medications on file prior to visit.     Review of Systems:  As per HPI- otherwise negative.   Physical Examination: Vitals:   03/27/18 1321 03/27/18 1323  BP: (!) 149/78 (!) 142/74  Pulse: 82 80  Resp: 16   Temp: 97.8 F (36.6 C)   SpO2: 96%    Vitals:   03/27/18 1321  Weight: 205 lb (93 kg)  Height: 5\' 5"  (1.651 m)   Body mass index is 34.11 kg/m. Ideal Body Weight: Weight in (lb) to have BMI = 25: 149.9  GEN: WDWN, NAD, Non-toxic, A & O x 3 HEENT: Atraumatic, Normocephalic. Neck supple. No masses, No LAD.  Bilateral TM wnl, oropharynx normal.  PEERL,EOMI.   Ears and Nose: No external deformity. CV: RRR, No M/G/R. No JVD. No thrill. No extra heart sounds. PULM: CTA B, no wheezes, crackles, rhonchi. No retractions. No resp. distress. No accessory muscle use. ABD: S, NT, ND EXTR: No c/c/e NEURO Normal gait.  PSYCH: Normally interactive. Conversant. Not depressed or anxious appearing.  Calm demeanor.  Normal romberg, normal strength, sensation and DTR of all extremities except at right knee where she has pain and a meniscal tear per her  report  Positive dix halpike Pt has a cane with her- she reports that she uses this when she is out but not while at home    Assessment and Plan: Benign paroxysmal positional vertigo, unspecified laterality - Plan: meclizine (ANTIVERT) 12.5 MG tablet  Acute non-recurrent frontal sinusitis - Plan: amoxicillin (AMOXIL) 500 MG capsule  Here today with BPPV  type vertigo and sinus pressure Neuro exam is reassuring Meclizine rx for her to use as needed, cautioned regarding sedation with this med Treat for possible sinusitis with amoxicillin   Meds ordered this encounter  Medications  . amoxicillin (AMOXIL) 500 MG capsule    Sig: Take 2 capsules (1,000 mg total) by mouth 2 (two) times daily.    Dispense:  40 capsule    Refill:  0  . meclizine (ANTIVERT) 12.5 MG tablet    Sig: Take 1 tablet (12.5 mg total) by mouth 3 (three) times daily as needed for dizziness.    Dispense:  30 tablet    Refill:  0     Signed Lamar Blinks, MD

## 2018-03-27 NOTE — Patient Instructions (Addendum)
Good to see you today; we are going to treat you for a sinus infection and also for BPPV- or benign paroxysmal positional vertigo. However, if your dizziness is not getting better by the end of the week please alert me- Sooner if worse.   We will use amoxicillin for your sinuses for 10 days I also gave you a medication called meclizine to use as needed for dizziness.  However this medication can make you feel drowsy, so do not use if you need to drive.   I would also recommend that you do not drive until your dizziness is resolved  Please let us know if you need anything or if you have any concerns

## 2018-04-02 ENCOUNTER — Encounter: Payer: Self-pay | Admitting: Gastroenterology

## 2018-05-05 ENCOUNTER — Other Ambulatory Visit: Payer: Self-pay | Admitting: Family

## 2018-05-08 ENCOUNTER — Telehealth: Payer: Self-pay | Admitting: Family

## 2018-05-08 MED ORDER — THYROID 60 MG PO TABS: 60.0000 mg | ORAL_TABLET | Freq: Every day | ORAL | 0 refills | Status: DC

## 2018-05-08 NOTE — Telephone Encounter (Signed)
Rx sent. Notified pt. 

## 2018-05-08 NOTE — Telephone Encounter (Signed)
Copied from [place] [id number]. Topic: General - Other >> May 08, 2018  7:47 AM Lennox Solders wrote: Reason for CRM: pt is calling and needs 30 day supply on thyroid medication send to [place]. Pt has an appt on 06-07-18. This medication was denied. Pt is out of med

## 2018-05-10 DIAGNOSIS — R69 Illness, unspecified: Secondary | ICD-10-CM | POA: Diagnosis not present

## 2018-05-24 DIAGNOSIS — R69 Illness, unspecified: Secondary | ICD-10-CM | POA: Diagnosis not present

## 2018-06-02 ENCOUNTER — Ambulatory Visit: Payer: Medicare HMO | Admitting: Family

## 2018-06-07 ENCOUNTER — Encounter: Payer: Self-pay | Admitting: Family

## 2018-06-07 ENCOUNTER — Telehealth: Payer: Self-pay | Admitting: Family

## 2018-06-07 ENCOUNTER — Ambulatory Visit (INDEPENDENT_AMBULATORY_CARE_PROVIDER_SITE_OTHER): Payer: Medicare HMO | Admitting: Family

## 2018-06-07 VITALS — BP 114/59 | HR 48 | Temp 98.2°F | Resp 18 | Ht 65.0 in | Wt 193.0 lb

## 2018-06-07 DIAGNOSIS — E039 Hypothyroidism, unspecified: Secondary | ICD-10-CM

## 2018-06-07 DIAGNOSIS — I10 Essential (primary) hypertension: Secondary | ICD-10-CM

## 2018-06-07 LAB — BASIC METABOLIC PANEL
BUN: 11 mg/dL (ref 6–23)
CHLORIDE: 108 meq/L (ref 96–112)
CO2: 28 meq/L (ref 19–32)
Calcium: 9.3 mg/dL (ref 8.4–10.5)
Creatinine, Ser: 0.73 mg/dL (ref 0.40–1.20)
GFR: 83 mL/min (ref >60.00)
GLUCOSE: 92 mg/dL (ref 70–99)
Potassium: 4.2 mEq/L (ref 3.5–5.1)
SODIUM: 142 meq/L (ref 135–145)

## 2018-06-07 LAB — TSH: TSH: 0.02 u[IU]/mL — ABNORMAL LOW (ref 0.35–4.50)

## 2018-06-07 MED ORDER — LISINOPRIL 20 MG PO TABS: 20.0000 mg | ORAL_TABLET | Freq: Every day | ORAL | 1 refills | Status: DC

## 2018-06-07 MED ORDER — THYROID 30 MG PO TABS: 45.0000 mg | ORAL_TABLET | Freq: Every day | ORAL | 2 refills | Status: DC

## 2018-06-07 NOTE — Progress Notes (Signed)
Subjective:    Patient ID: [name], female    DOB: [birth year], 72 y.o.   MRN: [id number]  HPI  Patient is a 72 yr old female who presents today for routine follow up:  1) Hypothyroid- maintained on armour thyroid.  Lab Results  Component Value Date   TSH 2.47 02/23/2018   2) HTN- maintained on lisinopril.   BP Readings from Last 3 Encounters:  06/07/18 (!) 114/59  03/27/18 (!) 142/74  12/23/17 120/78     Review of Systems See HPI  Past Medical History:  Diagnosis Date  . Arthritis   . Asthma    mild  . H/O bronchitis   . Hx of blood clots 1967   from birth control in LUE  . Hypertension   . Hypoglycemia   . Hypothyroidism   . Migraine    from food  . Pneumonia    hx of  . Seasonal allergies   . Sleep apnea   . UTI (urinary tract infection)      Social History   Socioeconomic History  . Marital status: Legally Separated    Spouse name: Not on file  . Number of children: Not on file  . Years of education: Not on file  . Highest education level: Not on file  Occupational History  . Not on file  Social Needs  . Financial resource strain: Not on file  . Food insecurity:    Worry: Not on file    Inability: Not on file  . Transportation needs:    Medical: Not on file    Non-medical: Not on file  Tobacco Use  . Smoking status: Former Smoker    Packs/day: 3.00    Years: 13.00    Pack years: 39.00    Types: Cigarettes    Last attempt to quit: 10/11/1974    Years since quitting: 43.6  . Smokeless tobacco: Never Used  Substance and Sexual Activity  . Alcohol use: No    Alcohol/week: 0.0 standard drinks  . Drug use: No  . Sexual activity: Never  Lifestyle  . Physical activity:    Days per week: Not on file    Minutes per session: Not on file  . Stress: Not on file  Relationships  . Social connections:    Talks on phone: Not on file    Gets together: Not on file    Attends religious service: Not on file    Active member of club or  organization: Not on file    Attends meetings of clubs or organizations: Not on file    Relationship status: Not on file  . Intimate partner violence:    Fear of current or ex partner: Not on file    Emotionally abused: Not on file    Physically abused: Not on file    Forced sexual activity: Not on file  Other Topics Concern  . Not on file  Social History Narrative  . Not on file    Past Surgical History:  Procedure Laterality Date  . CATARACT EXTRACTION     rt eye  . COLONOSCOPY    . detatched retina  2003   rt eye  . 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 Rheum arthritis Mother   . Thyroid disease Mother   .  Rheum arthritis Father   . Thyroid disease Father   . Rheum arthritis Maternal Grandmother   . Rheum arthritis Maternal Grandfather   . Rheum arthritis Paternal Grandmother     Allergies  Allergen Reactions  . Codeine Hives and Nausea And Vomiting  . Pork-Derived Products Other (See Comments)    Migraines w/ pork and other processed meats.  . Prednisone Other (See Comments)    Causes more pain    Current Outpatient Medications on File Prior to Visit  Medication Sig Dispense Refill  . acetaminophen (TYLENOL) 500 MG tablet Take 500-1,000 mg by mouth as needed for mild pain or moderate pain.     Marland Kitchen albuterol (PROVENTIL HFA;VENTOLIN HFA) 108 (90 Base) MCG/ACT inhaler Inhale 2 puffs into the lungs every 6 (six) hours as needed for wheezing or shortness of breath. 1 Inhaler 0  . Cholecalciferol (VITAMIN D) 2000 UNITS CAPS Take 2,000 Units by mouth 2 (two) times daily.     . cyanocobalamin 1000 MCG tablet Take 1,000 mcg by mouth daily.    . fexofenadine (ALLEGRA) 60 MG tablet Take 60 mg by mouth daily.    Marland Kitchen lisinopril (PRINIVIL,ZESTRIL) 20 MG tablet TAKE 1 TABLET BY MOUTH AT  BEDTIME 90 tablet 1  . thyroid (ARMOUR THYROID) 60 MG tablet Take 1 tablet (60 mg total) by mouth daily before breakfast. 30 tablet 0   No current facility-administered medications on file prior to visit.     BP (!) 114/59 (BP Location: Right Arm, Cuff Size: Normal)   Pulse (!) 48   Temp 98.2 F (36.8 C)   Resp 18   Ht 5\' 5"  (1.651 m)   Wt 193 lb (87.5 kg)   SpO2 99%   BMI 32.12 kg/m       Objective:   Physical Exam  Constitutional: She is oriented to person, place, and time. She appears well-developed and well-nourished. No distress.  Cardiovascular: Normal rate and regular rhythm.  Pulmonary/Chest: Effort normal.  Musculoskeletal:  2+ bilateral LE edema  Neurological: She is alert and oriented to person, place, and time.  Skin: Skin is warm and dry.  Psychiatric: She has a normal mood and affect. Her behavior is normal.          Assessment & Plan:  Hypothyroid- TSH is low, will decrease armour thyroid- see phone note.   HTN- BP stable on lisinopril BMET stable. Continue same.

## 2018-06-07 NOTE — Patient Instructions (Signed)
Please complete lab work prior to leaving.   

## 2018-06-07 NOTE — Telephone Encounter (Signed)
Thyroid medication needs to be decreased from 60mg  to 45 mg.  I would like her to take 1.5 tabs of the 30mg  tabs. Repeat tsh in 4-6 weeks.

## 2018-06-08 ENCOUNTER — Other Ambulatory Visit: Payer: Self-pay | Admitting: Family

## 2018-06-08 NOTE — Telephone Encounter (Signed)
Author phoned pt. to clarify new armour thyroid order, to take 45mg  (1.5) tabs daily. Pt. Verbalized understanding. Unable to make lab appointment as pt. Needed to go, but forwarded to Gilmore Laroche, CMA to re-contact to make a follow-up lab appointment per [facility] request. Lab order placed.

## 2018-06-08 NOTE — Telephone Encounter (Signed)
Copied from [place] [phone]. Topic: Quick Communication - See Telephone Encounter >> Jun 08, 2018 11:09 AM Ahmed Prima L wrote: CRM for notification. See Telephone encounter for: 06/08/18.  Patient was in the office yesterday with Melissa. Patient said she went to the pharmacy to get her thyroid (ARMOUR THYROID) 30 MG tablet and noticed it was changed from 60 mg to 30 mg. She said that Melissa did not mention that to her and wants to know why. She said the 60 mg was working fine for her.

## 2018-06-13 ENCOUNTER — Telehealth: Payer: Self-pay | Admitting: *Deleted

## 2018-06-13 NOTE — Telephone Encounter (Signed)
Received fax from [facility] stating insurance prefers levothyroxine. Spoke with pt, she states she has been paying out of pocket for armour thyroid for some time and prefers to stay on that. Does not feel the levothryoxine works as well and wishes to pay out of pocket for the armour thyroid. Fax denial / reason sent back to pharmacy.

## 2018-06-14 NOTE — Telephone Encounter (Signed)
Spoke with pt. She states she does not have her schedule with her and she will call back tomorrow to schedule the lab appointment in 6 weeks. Future order was already placed. Ok for [employer] /triage to discuss with pt.

## 2018-06-14 NOTE — Telephone Encounter (Signed)
Please contact pt to schedule lab visit.

## 2018-08-02 ENCOUNTER — Telehealth: Payer: Self-pay | Admitting: Family

## 2018-08-02 ENCOUNTER — Other Ambulatory Visit (INDEPENDENT_AMBULATORY_CARE_PROVIDER_SITE_OTHER): Payer: Medicare HMO

## 2018-08-02 DIAGNOSIS — E039 Hypothyroidism, unspecified: Secondary | ICD-10-CM

## 2018-08-02 LAB — TSH: TSH: 0.01 u[IU]/mL — AB (ref 0.35–4.50)

## 2018-08-04 ENCOUNTER — Other Ambulatory Visit: Payer: Self-pay

## 2018-08-04 ENCOUNTER — Telehealth: Payer: Self-pay | Admitting: *Deleted

## 2018-08-04 DIAGNOSIS — E039 Hypothyroidism, unspecified: Secondary | ICD-10-CM

## 2018-08-04 MED ORDER — THYROID 32.5 MG PO TABS: ORAL_TABLET | ORAL | 2 refills | Status: DC

## 2018-08-04 NOTE — Telephone Encounter (Signed)
Copied from [place] [phone]. Topic: General - Other >> Aug 03, 2018  2:45 PM [name] R wrote: Patient is requesting a call concerning labs that were done she is asking for a call before anything is sent to pharmacy

## 2018-08-04 NOTE — Telephone Encounter (Signed)
Melissa -- FYI message below.  Left message for pt to return my call. Ok for [facility] to discuss with patient.

## 2018-08-04 NOTE — Telephone Encounter (Signed)
Pt. Wants to go to generic Armour Thyroid because of the cost. Please advise pt.

## 2018-08-04 NOTE — Telephone Encounter (Signed)
rx sent for generic in similar dose, exact dose does not exist in generic form.

## 2018-08-06 NOTE — Telephone Encounter (Signed)
Opened in error

## 2018-08-07 ENCOUNTER — Ambulatory Visit (INDEPENDENT_AMBULATORY_CARE_PROVIDER_SITE_OTHER): Payer: Medicare HMO

## 2018-08-07 DIAGNOSIS — Z23 Encounter for immunization: Secondary | ICD-10-CM

## 2018-08-07 NOTE — Telephone Encounter (Signed)
Notified pt. She already confirmed that she takes medication on an empty stomach.

## 2018-08-07 NOTE — Telephone Encounter (Signed)
Armour thyroid does not have an exact 'known" amount like the levothyroxine does so sometimes that accounts for variations. As we get older, sometimes our thyroid doesn't work as well as it did when we were younger. Taking it at a certain time to ensure proper absorption is also not done sometimes that could cause this. TY.

## 2018-08-07 NOTE — Telephone Encounter (Addendum)
Notified pt of below. She is wondering what could be causing her thyroid levels to be dropping so much over the last year. States she has not done anything different other than working on weight loss. Pt is aware that PCP is out of the office until Wednesday and may not be addressed until then. Please advise?

## 2018-08-09 ENCOUNTER — Telehealth: Payer: Self-pay | Admitting: Family

## 2018-08-09 NOTE — Telephone Encounter (Signed)
Copied from [place] [phone]. Topic: Quick Communication - See Telephone Encounter >> Aug 09, 2018  2:58 PM Ivar Drape wrote: CRM for notification. See Telephone encounter for: 08/09/18. [facility] [phone] stated that the patient's thyroid (ARMOUR) 32.5 MG tablet medication does not come in 32.5mg .  It comes in 30mg , and this is what she has been receiving in the pass.  They will need a new prescription sent to them for this medication with 30mg  on it.

## 2018-08-10 NOTE — Telephone Encounter (Signed)
Pt called in again about this med change.  Please advise  Best number [phone]

## 2018-08-11 MED ORDER — THYROID 30 MG PO TABS: 30.0000 mg | ORAL_TABLET | Freq: Every day | ORAL | 2 refills | Status: DC

## 2018-08-11 NOTE — Telephone Encounter (Signed)
rx resent for 30mg  tabs.

## 2018-08-11 NOTE — Telephone Encounter (Signed)
Notified pt and she voices understanding. 

## 2018-09-18 ENCOUNTER — Other Ambulatory Visit (INDEPENDENT_AMBULATORY_CARE_PROVIDER_SITE_OTHER): Payer: Medicare HMO

## 2018-09-18 DIAGNOSIS — E039 Hypothyroidism, unspecified: Secondary | ICD-10-CM

## 2018-09-18 LAB — TSH: TSH: 0.02 u[IU]/mL — AB (ref 0.35–4.50)

## 2018-09-20 ENCOUNTER — Telehealth: Payer: Self-pay | Admitting: Family

## 2018-09-20 DIAGNOSIS — E039 Hypothyroidism, unspecified: Secondary | ICD-10-CM

## 2018-09-20 MED ORDER — THYROID 15 MG PO TABS: 15.0000 mg | ORAL_TABLET | Freq: Every day | ORAL | 2 refills | Status: DC

## 2018-09-20 NOTE — Telephone Encounter (Signed)
Copied from [place] [phone]. Topic: General - Other >> Sep 20, 2018  1:57 PM Alfredia Ferguson R wrote: Patient called in for TSH results. She states may leave detailed message if no one answers

## 2018-09-20 NOTE — Telephone Encounter (Signed)
Lab work shows thyroid med still too strong. Decrease armour thyroid from 30mg  to 15mg .  Repeat tsh in 6 weeks.

## 2018-09-21 ENCOUNTER — Other Ambulatory Visit: Payer: Self-pay

## 2018-09-21 DIAGNOSIS — E039 Hypothyroidism, unspecified: Secondary | ICD-10-CM

## 2018-09-21 NOTE — Telephone Encounter (Signed)
Lm for patient to call us back about her tsh results advised new x was sent to her pharmacy. [place] for triage nurse to release information to patient.

## 2018-09-21 NOTE — Progress Notes (Signed)
tsh

## 2018-09-22 NOTE — Telephone Encounter (Signed)
Talked to patient, advised of results and medication decrease. She will pick up at her local pharmacy listed and she will call back to schedule the TSH.

## 2018-12-06 ENCOUNTER — Ambulatory Visit (INDEPENDENT_AMBULATORY_CARE_PROVIDER_SITE_OTHER): Payer: Medicare HMO | Admitting: Family

## 2018-12-06 ENCOUNTER — Encounter: Payer: Self-pay | Admitting: Family

## 2018-12-06 ENCOUNTER — Telehealth: Payer: Self-pay | Admitting: Family

## 2018-12-06 VITALS — BP 125/71 | HR 51 | Temp 97.5°F | Resp 16 | Ht 65.0 in | Wt 183.6 lb

## 2018-12-06 DIAGNOSIS — E559 Vitamin D deficiency, unspecified: Secondary | ICD-10-CM | POA: Diagnosis not present

## 2018-12-06 DIAGNOSIS — L304 Erythema intertrigo: Secondary | ICD-10-CM | POA: Diagnosis not present

## 2018-12-06 DIAGNOSIS — E039 Hypothyroidism, unspecified: Secondary | ICD-10-CM

## 2018-12-06 DIAGNOSIS — Z0000 Encounter for general adult medical examination without abnormal findings: Secondary | ICD-10-CM | POA: Diagnosis not present

## 2018-12-06 DIAGNOSIS — L309 Dermatitis, unspecified: Secondary | ICD-10-CM | POA: Diagnosis not present

## 2018-12-06 DIAGNOSIS — R252 Cramp and spasm: Secondary | ICD-10-CM

## 2018-12-06 DIAGNOSIS — I10 Essential (primary) hypertension: Secondary | ICD-10-CM

## 2018-12-06 LAB — BASIC METABOLIC PANEL
BUN: 17 mg/dL (ref 6–23)
CO2: 28 mEq/L (ref 19–32)
Calcium: 9.4 mg/dL (ref 8.4–10.5)
Chloride: 103 mEq/L (ref 96–112)
Creatinine, Ser: 0.8 mg/dL (ref 0.40–1.20)
GFR: 70.17 mL/min (ref >60.00)
Glucose, Bld: 76 mg/dL (ref 70–99)
Potassium: 4.2 mEq/L (ref 3.5–5.1)
Sodium: 140 mEq/L (ref 135–145)

## 2018-12-06 MED ORDER — BETAMETHASONE DIPROPIONATE 0.05 % EX CREA: TOPICAL_CREAM | CUTANEOUS | 1 refills | Status: DC

## 2018-12-06 MED ORDER — THYROID 15 MG PO TABS: 15.0000 mg | ORAL_TABLET | Freq: Every day | ORAL | 5 refills | Status: DC

## 2018-12-06 MED ORDER — NYSTATIN 100000 UNIT/GM EX POWD: Freq: Two times a day (BID) | CUTANEOUS | 0 refills | Status: AC | PRN

## 2018-12-06 MED ORDER — ZOSTER VAC RECOMB ADJUVANTED 50 MCG/0.5ML IM SUSR: INTRAMUSCULAR | 1 refills | Status: DC

## 2018-12-06 NOTE — Progress Notes (Signed)
Subjective:    Patient ID: [name], female    DOB: 11/11/1945, 73 y.o.   MRN: [id number]  HPI  Patient presents today for complete physical.  Immunizations: tetanus due Diet:  Reports diet is fair (has cut back to help with weight loss) Exercise:  Some not as much as she would like Colonoscopy: due Dexa: 3/18  Pap Smear: N/A Mammogram: 2019 at [facility]. She will request that report be faxed  Reports some bilateral leg pain/cramping.   Hypothyroid- reports she feels well on current dose of armour thyroid.  Wt Readings from Last 3 Encounters:  12/06/18 183 lb 9.6 oz (83.3 kg)  06/07/18 193 lb (87.5 kg)  03/27/18 205 lb (93 kg)     Review of Systems  Constitutional: Negative for unexpected weight change.  HENT: Positive for rhinorrhea (mild "due to the weather").   Respiratory: Negative for cough.   Cardiovascular: Negative for leg swelling.  Gastrointestinal: Negative for constipation and diarrhea.  Genitourinary: Negative for dysuria and frequency.  Musculoskeletal: Positive for arthralgias (left knee stiffness).       Mild knee swelling on left  Skin: Negative for rash.  Neurological: Negative for headaches (rare ).  Hematological: Negative for adenopathy.  Psychiatric/Behavioral:       Denies depression/anxiety       Past Medical History:  Diagnosis Date  . Arthritis   . Asthma    mild  . H/O bronchitis   . Hx of blood clots 1967   from birth control in LUE  . Hypertension   . Hypoglycemia   . Hypothyroidism   . Migraine    from food  . Pneumonia    hx of  . Seasonal allergies   . Sleep apnea   . UTI (urinary tract infection)      Social History   Socioeconomic History  . Marital status: Legally Separated    Spouse name: Not on file  . Number of children: Not on file  . Years of education: Not on file  . Highest education level: Not on file  Occupational History  . Not on file  Social Needs  . Financial resource strain: Not on file    . Food insecurity:    Worry: Not on file    Inability: Not on file  . Transportation needs:    Medical: Not on file    Non-medical: Not on file  Tobacco Use  . Smoking status: Former Smoker    Packs/day: 3.00    Years: 13.00    Pack years: 39.00    Types: Cigarettes    Last attempt to quit: 10/11/1974    Years since quitting: 44.1  . Smokeless tobacco: Never Used  Substance and Sexual Activity  . Alcohol use: No    Alcohol/week: 0.0 standard drinks  . Drug use: No  . Sexual activity: Never  Lifestyle  . Physical activity:    Days per week: Not on file    Minutes per session: Not on file  . Stress: Not on file  Relationships  . Social connections:    Talks on phone: Not on file    Gets together: Not on file    Attends religious service: Not on file    Active member of club or organization: Not on file    Attends meetings of clubs or organizations: Not on file    Relationship status: Not on file  . Intimate partner violence:    Fear of current or ex partner: Not  on file    Emotionally abused: Not on file    Physically abused: Not on file    Forced sexual activity: Not on file  Other Topics Concern  . Not on file  Social History Narrative  . Not on file    Past Surgical History:  Procedure Laterality Date  . CATARACT EXTRACTION     rt eye  . COLONOSCOPY    . detatched retina  2003   rt eye  . 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 Rheum arthritis Mother   . Thyroid disease Mother   . Rheum arthritis Father   . Thyroid disease Father   . Rheum arthritis Maternal Grandmother   . Rheum arthritis Maternal Grandfather   . Rheum arthritis Paternal Grandmother     Allergies  Allergen Reactions  . Codeine Hives and Nausea And Vomiting  . Pork-Derived Products Other (See  Comments)    Migraines w/ pork and other processed meats.  . Prednisone Other (See Comments)    Causes more pain    Current Outpatient Medications on File Prior to Visit  Medication Sig Dispense Refill  . acetaminophen (TYLENOL) 500 MG tablet Take 500-1,000 mg by mouth as needed for mild pain or moderate pain.     Marland Kitchen albuterol (PROVENTIL HFA;VENTOLIN HFA) 108 (90 Base) MCG/ACT inhaler Inhale 2 puffs into the lungs every 6 (six) hours as needed for wheezing or shortness of breath. 1 Inhaler 0  . Cholecalciferol (VITAMIN D) 2000 UNITS CAPS Take 2,000 Units by mouth 2 (two) times daily.     . cyanocobalamin 1000 MCG tablet Take 1,000 mcg by mouth daily.    . fexofenadine (ALLEGRA) 60 MG tablet Take 60 mg by mouth daily.    Marland Kitchen lisinopril (PRINIVIL,ZESTRIL) 20 MG tablet Take 1 tablet (20 mg total) by mouth at bedtime. 90 tablet 1  . thyroid (ARMOUR) 15 MG tablet Take 1 tablet (15 mg total) by mouth daily. 30 tablet 2   No current facility-administered medications on file prior to visit.     BP 125/71 (BP Location: Right Arm, Patient Position: Sitting, Cuff Size: Small)   Pulse (!) 51   Temp (!) 97.5 F (36.4 C) (Oral)   Resp 16   Ht 5\' 5"  (1.651 m)   Wt 183 lb 9.6 oz (83.3 kg)   SpO2 100%   BMI 30.55 kg/m    Objective:   Physical Exam Physical Exam  Constitutional: She is oriented to person, place, and time. She appears well-developed and well-nourished. No distress.  HENT:  Head: Normocephalic and atraumatic.  Right Ear: Tympanic membrane and ear canal normal.  Left Ear: Tympanic membrane and ear canal normal.  Mouth/Throat: Oropharynx is clear and moist.  Eyes: Pupils are equal, round, and reactive to light. No scleral icterus.  Neck: Normal range of motion. No thyromegaly present.  Cardiovascular: Normal rate and regular rhythm.   No murmur heard. Pulmonary/Chest: Effort normal and breath sounds normal. No respiratory distress. He has no wheezes. She has no rales. She exhibits  no tenderness.  Abdominal: Soft. Bowel sounds are normal. She exhibits no distension and no mass. There is no tenderness. There is no rebound and no guarding.  Musculoskeletal: She exhibits no edema.  Lymphadenopathy:    She has no cervical adenopathy.  Neurological: She  is alert and oriented to person, place, and time. She has normal patellar reflexes. She exhibits normal muscle tone. Coordination normal.  Skin: Skin is warm and dry. eczema both palmar surfaces, erythematous rash beneath both breasts L>R Psychiatric: She has a normal mood and affect. Her behavior is normal. Judgment and thought content normal.  Breasts: Examined lying Right: Without masses, retractions, discharge or axillary adenopathy.  Left: Without masses, retractions, discharge or axillary adenopathy.     Pelvic:deferred Assessment & Plan:   Preventive care- recommended Shingrix vaccine.  She reports mammogram is up-to-date and will get a copy faxed to [place].  Discussed healthy diet and exercise.  Encouraged patient to continue with weight loss.  Eczema-noted on both hands palmar surface.  Rx sent for steroid cream.  Intertrigo-noted beneath breasts.  Rx sent for nystatin powder.  Hypothyroid- clinically stable on Armour Thyroid.  Obtain follow-up TSH.  Leg cramping- we will check electrolytes.  Encouraged good hydration.  Hypertension-blood pressure stable on lisinopril continue same. BP Readings from Last 3 Encounters:  12/06/18 125/71  06/07/18 (!) 114/59  03/27/18 (!) 142/74        Assessment & Plan:

## 2018-12-06 NOTE — Patient Instructions (Addendum)
Please complete lab work prior to leaving. Apply betamethasone cream twice daily to hands as needed for eczema Apply nystatin powder twice daily as needed beneath breasts for rash.

## 2018-12-06 NOTE — Telephone Encounter (Signed)
I tried to call patient and get more information but she does not answer the telephone.

## 2018-12-06 NOTE — Telephone Encounter (Signed)
I see that prednisone causes "more pain".I do not suspect that a topical will cause this reaction. If she wishes to avoid steroid based creams, then I would recommend a good ointment such as Eucerin.

## 2018-12-06 NOTE — Telephone Encounter (Signed)
Copied from [place] [id number]. Topic: Quick Communication - See Telephone Encounter >> Dec 06, 2018  2:32 PM Rutherford Nail, [place] wrote: CRM for notification. See Telephone encounter for: 12/06/18. Patient calling and states that the pharmacy called her and informed her that the betamethasone dipropionate (DIPROLENE) 0.05 % cream  that was sent in, is in the cortisone family. Would like to know if there are any other creams that could be sent in? Please advise

## 2018-12-07 LAB — TSH: TSH: 2.03 u[IU]/mL (ref 0.35–4.50)

## 2018-12-07 NOTE — Telephone Encounter (Signed)
Pt states she will try the Eucerin ointment, and thank you.

## 2018-12-11 ENCOUNTER — Telehealth: Payer: Self-pay | Admitting: *Deleted

## 2018-12-11 NOTE — Telephone Encounter (Signed)
Copied from [place] [id number]. Topic: General - Other >> Dec 11, 2018  8:00 AM Carolyn Stare wrote:  Pt would like a copy of her labs mail to her address and also a fup call about her thyroid medicine

## 2018-12-13 NOTE — Telephone Encounter (Signed)
Talked to patient copy of labs will be mailed out to her, advised her tsh was within normal limits.

## 2018-12-14 ENCOUNTER — Encounter: Payer: Self-pay | Admitting: Family

## 2018-12-14 LAB — VITAMIN D 1,25 DIHYDROXY
Vitamin D 1, 25 (OH)2 Total: 61 pg/mL (ref 18–72)
Vitamin D2 1, 25 (OH)2: <8 pg/mL
Vitamin D3 1, 25 (OH)2: 61 pg/mL

## 2018-12-14 NOTE — Progress Notes (Signed)
Mailed out to pt 

## 2018-12-15 ENCOUNTER — Telehealth: Payer: Self-pay | Admitting: *Deleted

## 2018-12-15 NOTE — Telephone Encounter (Signed)
Received Mammogram results from [facility];  forwarded to provider/SLS 03/06

## 2018-12-29 ENCOUNTER — Telehealth: Payer: Self-pay | Admitting: Pulmonary Disease

## 2018-12-29 DIAGNOSIS — G4733 Obstructive sleep apnea (adult) (pediatric): Secondary | ICD-10-CM

## 2018-12-29 DIAGNOSIS — Z9989 Dependence on other enabling machines and devices: Secondary | ICD-10-CM

## 2018-12-29 NOTE — Telephone Encounter (Signed)
RA please advise if we can send in a prescription for her fillers and other CPAP supplies. Thank you.

## 2018-12-30 NOTE — Telephone Encounter (Signed)
Okay to send prescription.

## 2019-01-01 NOTE — Telephone Encounter (Signed)
RA agreed to send cpap supplies to pt, [facility] Placed order for the supplies today Nothing further needed.

## 2019-01-02 ENCOUNTER — Ambulatory Visit: Payer: Medicare HMO | Admitting: Pulmonary Disease

## 2019-01-26 ENCOUNTER — Other Ambulatory Visit: Payer: Self-pay | Admitting: Family

## 2019-03-08 DIAGNOSIS — R69 Illness, unspecified: Secondary | ICD-10-CM | POA: Diagnosis not present

## 2019-03-08 DIAGNOSIS — L570 Actinic keratosis: Secondary | ICD-10-CM | POA: Diagnosis not present

## 2019-03-26 DIAGNOSIS — G4733 Obstructive sleep apnea (adult) (pediatric): Secondary | ICD-10-CM | POA: Diagnosis not present

## 2019-04-18 ENCOUNTER — Ambulatory Visit: Payer: Medicare HMO | Admitting: Primary Care

## 2019-04-18 ENCOUNTER — Encounter: Payer: Self-pay | Admitting: Primary Care

## 2019-04-18 ENCOUNTER — Other Ambulatory Visit: Payer: Self-pay

## 2019-04-18 VITALS — BP 122/82 | HR 60 | Temp 98.2°F | Ht 64.5 in | Wt 193.4 lb

## 2019-04-18 DIAGNOSIS — G4733 Obstructive sleep apnea (adult) (pediatric): Secondary | ICD-10-CM

## 2019-04-18 DIAGNOSIS — Z9989 Dependence on other enabling machines and devices: Secondary | ICD-10-CM | POA: Diagnosis not present

## 2019-04-18 NOTE — Patient Instructions (Addendum)
Orders: - Increase pressure to 12cm h20 (down load in 4 weeks) - Contact [facility] to help patient change heat/cooling setting on CPAP   Obstructive sleep apnea: - Continue to wear CPAP every night for 4-6 hours or more - Do not drive if experiencing excessive daytime fatigue or somnolence - Do not take sedating medication or drink alcohol in excess prior to bedtime as these can worsen your sleep apnea  Follow-up: - 1 year with Dr. Elsworth Soho    CPAP and BPAP Information CPAP and BPAP are methods of helping a person breathe with the use of air pressure. CPAP stands for "continuous positive airway pressure." BPAP stands for "bi-level positive airway pressure." In both methods, air is blown through your nose or mouth and into your air passages to help you breathe well. CPAP and BPAP use different amounts of pressure to blow air. With CPAP, the amount of pressure stays the same while you breathe in and out. With BPAP, the amount of pressure is increased when you breathe in (inhale) so that you can take larger breaths. Your health care provider will recommend whether CPAP or BPAP would be more helpful for you. Why are CPAP and BPAP treatments used? CPAP or BPAP can be helpful if you have:  Sleep apnea.  Chronic obstructive pulmonary disease (COPD).  Heart failure.  Medical conditions that weaken the muscles of the chest including muscular dystrophy, or neurological diseases such as amyotrophic lateral sclerosis (ALS).  Other problems that cause breathing to be weak, abnormal, or difficult. CPAP is most commonly used for obstructive sleep apnea (OSA) to keep the airways from collapsing when the muscles relax during sleep. How is CPAP or BPAP administered? Both CPAP and BPAP are provided by a small machine with a flexible plastic tube that attaches to a plastic mask. You wear the mask. Air is blown through the mask into your nose or mouth. The amount of pressure that is used to blow the air can be  adjusted on the machine. Your health care provider will determine the pressure setting that should be used based on your individual needs. When should CPAP or BPAP be used? In most cases, the mask only needs to be worn during sleep. Generally, the mask needs to be worn throughout the night and during any daytime naps. People with certain medical conditions may also need to wear the mask at other times when they are awake. Follow instructions from your health care provider about when to use the machine. What are some tips for using the mask?   Because the mask needs to be snug, some people feel trapped or closed-in (claustrophobic) when first using the mask. If you feel this way, you may need to get used to the mask. One way to do this is by holding the mask loosely over your nose or mouth and then gradually applying the mask more snugly. You can also gradually increase the amount of time that you use the mask.  Masks are available in various types and sizes. Some fit over your mouth and nose while others fit over just your nose. If your mask does not fit well, talk with your health care provider about getting a different one.  If you are using a mask that fits over your nose and you tend to breathe through your mouth, a chin strap may be applied to help keep your mouth closed.  The CPAP and BPAP machines have alarms that may sound if the mask comes off or develops  a leak.  If you have trouble with the mask, it is very important that you talk with your health care provider about finding a way to make the mask easier to tolerate. Do not stop using the mask. Stopping the use of the mask could have a negative impact on your health. What are some tips for using the machine?  Place your CPAP or BPAP machine on a secure table or stand near an electrical outlet.  Know where the on/off switch is located on the machine.  Follow instructions from your health care provider about how to set the pressure on  your machine and when you should use it.  Do not eat or drink while the CPAP or BPAP machine is on. Food or fluids could get pushed into your lungs by the pressure of the CPAP or BPAP.  Do not smoke. Tobacco smoke residue can damage the machine.  For home use, CPAP and BPAP machines can be rented or purchased through home health care companies. Many different brands of machines are available. Renting a machine before purchasing may help you find out which particular machine works well for you.  Keep the CPAP or BPAP machine and attachments clean. Ask your health care provider for specific instructions. Get help right away if:  You have redness or open areas around your nose or mouth where the mask fits.  You have trouble using the CPAP or BPAP machine.  You cannot tolerate wearing the CPAP or BPAP mask.  You have pain, discomfort, and bloating in your abdomen. Summary  CPAP and BPAP are methods of helping a person breathe with the use of air pressure.  Both CPAP and BPAP are provided by a small machine with a flexible plastic tube that attaches to a plastic mask.  If you have trouble with the mask, it is very important that you talk with your health care provider about finding a way to make the mask easier to tolerate. This information is not intended to replace advice given to you by your health care provider. Make sure you discuss any questions you have with your health care provider. Document Released: 06/25/2004 Document Revised: 01/17/2019 Document Reviewed: 08/16/2016 Elsevier Patient Education  [employer].  Sleep Apnea Sleep apnea affects breathing during sleep. It causes breathing to stop for a short time or to become shallow. It can also increase the risk of:  Heart attack.  Stroke.  Being very overweight (obese).  Diabetes.  Heart failure.  Irregular heartbeat. The goal of treatment is to help you breathe normally again. What are the causes? There are  three kinds of sleep apnea:  Obstructive sleep apnea. This is caused by a blocked or collapsed airway.  Central sleep apnea. This happens when the brain does not send the right signals to the muscles that control breathing.  Mixed sleep apnea. This is a combination of obstructive and central sleep apnea. The most common cause of this condition is a collapsed or blocked airway. This can happen if:  Your throat muscles are too relaxed.  Your tongue and tonsils are too large.  You are overweight.  Your airway is too small. What increases the risk?  Being overweight.  Smoking.  Having a small airway.  Being older.  Being female.  Drinking alcohol.  Taking medicines to calm yourself (sedatives or tranquilizers).  Having family members with the condition. What are the signs or symptoms?  Trouble staying asleep.  Being sleepy or tired during the day.  Getting angry a lot.  Loud snoring.  Headaches in the morning.  Not being able to focus your mind (concentrate).  Forgetting things.  Less interest in sex.  Mood swings.  Personality changes.  Feelings of sadness (depression).  Waking up a lot during the night to pee (urinate).  Dry mouth.  Sore throat. How is this diagnosed?  Your medical history.  A physical exam.  A test that is done when you are sleeping (sleep study). The test is most often done in a sleep lab but may also be done at home. How is this treated?   Sleeping on your side.  Using a medicine to get rid of mucus in your nose (decongestant).  Avoiding the use of alcohol, medicines to help you relax, or certain pain medicines (narcotics).  Losing weight, if needed.  Changing your diet.  Not smoking.  Using a machine to open your airway while you sleep, such as: ? An oral appliance. This is a mouthpiece that shifts your lower jaw forward. ? A CPAP device. This device blows air through a mask when you breathe out (exhale). ? An  EPAP device. This has valves that you put in each nostril. ? A BPAP device. This device blows air through a mask when you breathe in (inhale) and breathe out.  Having surgery if other treatments do not work. It is important to get treatment for sleep apnea. Without treatment, it can lead to:  High blood pressure.  Coronary artery disease.  In men, not being able to have an erection (impotence).  Reduced thinking ability. Follow these instructions at home: Lifestyle  Make changes that your doctor recommends.  Eat a healthy diet.  Lose weight if needed.  Avoid alcohol, medicines to help you relax, and some pain medicines.  Do not use any products that contain nicotine or tobacco, such as cigarettes, e-cigarettes, and chewing tobacco. If you need help quitting, ask your doctor. General instructions  Take over-the-counter and prescription medicines only as told by your doctor.  If you were given a machine to use while you sleep, use it only as told by your doctor.  If you are having surgery, make sure to tell your doctor you have sleep apnea. You may need to bring your device with you.  Keep all follow-up visits as told by your doctor. This is important. Contact a doctor if:  The machine that you were given to use during sleep bothers you or does not seem to be working.  You do not get better.  You get worse. Get help right away if:  Your chest hurts.  You have trouble breathing in enough air.  You have an uncomfortable feeling in your back, arms, or stomach.  You have trouble talking.  One side of your body feels weak.  A part of your face is hanging down. These symptoms may be an emergency. Do not wait to see if the symptoms will go away. Get medical help right away. Call your local emergency services (911 in the [place] Do not drive yourself to the hospital. Summary  This condition affects breathing during sleep.  The most common cause is a collapsed or  blocked airway.  The goal of treatment is to help you breathe normally while you sleep. This information is not intended to replace advice given to you by your health care provider. Make sure you discuss any questions you have with your health care provider. Document Released: 07/06/2008 Document Revised: 07/14/2018 Document Reviewed: 05/23/2018  Elsevier Patient Education  [employer].

## 2019-04-18 NOTE — Assessment & Plan Note (Signed)
-   100% compliant with CPAP and reports benefit from use - Pressure 11cm H20; AHI 7.8 - Increase CPAP pressure to 12cm h20 (Download in 4 weeks) - Continue to wear CPAP every night for 4-6 hours or more - Do not drive if experiencing excessive daytime fatigue or somnolence - Contact [facility] to help change heat/cooling setting on machine  - FU in 1 year

## 2019-04-18 NOTE — Progress Notes (Signed)
@Patient  ID: [name], female    DOB: [birth year], 73 y.o.   MRN: [id number]  Chief Complaint  Patient presents with  . Follow-up    41yr f/u for OSA. Uses [facility] as her DME.     Referring provider: Debbrah Alar, NP  HPI: 73 year old female, former smoker. PMH significant for HTN, allergic rhinitis, asthma, OSA on CPAP. Patient of Dr. Elsworth Soho, lat seen by pulmonary NP on 12/22/17. Sleep study 2012 showed AHI 16/hr. Pressure 11cm h20.   04/18/2019 Patient presents today for annual OSA follow-up. She is feeling wonderful. No issues with mask or pressure setting. Reports significant benefit from CPAP use. Able to sleep well throughout the night and feels rested the next day. Asking if we can help cool air setting on CPAP. She does not do well with heat.   Airview download: Use 30/30 days; 100% >4 hours  Pressure 11cm H20 Minimal air leak AHI 7.8   Allergies  Allergen Reactions  . Codeine Hives and Nausea And Vomiting  . Pork-Derived Products Other (See Comments)    Migraines w/ pork and other processed meats.  . Prednisone Other (See Comments)    Causes more pain    Immunization History  Administered Date(s) Administered  . Influenza Split 07/20/2011, 07/05/2012  . Influenza Whole 08/31/2007, 07/18/2008, 07/17/2009, 07/09/2010  . Influenza, High Dose Seasonal PF 08/04/2013, 07/04/2015, 07/21/2016, 07/22/2017, 08/07/2018  . Influenza,inj,Quad PF,6+ Mos 08/06/2014  . Pneumococcal Conjugate-13 11/14/2014  . Pneumococcal Polysaccharide-23 09/27/2007, 11/19/2015  . Td 03/27/2008  . Zoster 11/02/2011    Past Medical History:  Diagnosis Date  . Arthritis   . Asthma    mild  . H/O bronchitis   . Hx of blood clots 1967   from birth control in LUE  . Hypertension   . Hypoglycemia   . Hypothyroidism   . Migraine    from food  . Pneumonia    hx of  . Seasonal allergies   . Sleep apnea   . UTI (urinary tract infection)     Tobacco History: Social History    Tobacco Use  Smoking Status Former Smoker  . Packs/day: 3.00  . Years: 13.00  . Pack years: 39.00  . Types: Cigarettes  . Quit date: 10/11/1974  . Years since quitting: 44.5  Smokeless Tobacco Never Used   Counseling given: Not Answered   Outpatient Medications Prior to Visit  Medication Sig Dispense Refill  . acetaminophen (TYLENOL) 500 MG tablet Take 500-1,000 mg by mouth as needed for mild pain or moderate pain.     Marland Kitchen albuterol (PROVENTIL HFA;VENTOLIN HFA) 108 (90 Base) MCG/ACT inhaler Inhale 2 puffs into the lungs every 6 (six) hours as needed for wheezing or shortness of breath. 1 Inhaler 0  . betamethasone dipropionate (DIPROLENE) 0.05 % cream Apply twice daily to eczema on hands as needed 30 g 1  . Cholecalciferol (VITAMIN D) 2000 UNITS CAPS Take 2,000 Units by mouth 2 (two) times daily.     . cyanocobalamin 1000 MCG tablet Take 1,000 mcg by mouth daily.    . fexofenadine (ALLEGRA) 60 MG tablet Take 60 mg by mouth daily.    Marland Kitchen lisinopril (ZESTRIL) 20 MG tablet TAKE 1 TABLET BY MOUTH AT BEDTIME 90 tablet 0  . nystatin (MYCOSTATIN/NYSTOP) powder Apply topically 2 (two) times daily as needed. Beneath both breasts 15 g 0  . thyroid (ARMOUR) 15 MG tablet Take 1 tablet (15 mg total) by mouth daily. 30 tablet 5  . Zoster  Vaccine Adjuvanted [facility]) injection 0.11ml IM now and again in 2-6 months 0.5 mL 1   No facility-administered medications prior to visit.     Review of Systems  Review of Systems  Constitutional: Negative.   HENT: Positive for postnasal drip.   Respiratory: Negative.    Physical Exam  BP 122/82   Pulse 60   Temp 98.2 F (36.8 C) (Oral)   Ht 5' 4.5" (1.638 m)   Wt 193 lb 6.4 oz (87.7 kg)   SpO2 97%   BMI 32.68 kg/m  Physical Exam Constitutional:      Appearance: Normal appearance. She is well-developed.  HENT:     Head: Normocephalic and atraumatic.  Eyes:     Pupils: Pupils are equal, round, and reactive to light.  Neck:     Musculoskeletal:  Normal range of motion and neck supple.  Cardiovascular:     Rate and Rhythm: Normal rate and regular rhythm.     Heart sounds: Normal heart sounds. No murmur.  Pulmonary:     Effort: Pulmonary effort is normal. No respiratory distress.     Breath sounds: Normal breath sounds. No wheezing.  Musculoskeletal: Normal range of motion.  Skin:    General: Skin is warm and dry.     Findings: No erythema or rash.  Neurological:     General: No focal deficit present.     Mental Status: She is alert and oriented to person, place, and time. Mental status is at baseline.  Psychiatric:        Mood and Affect: Mood normal.        Behavior: Behavior normal.        Thought Content: Thought content normal.        Judgment: Judgment normal.      Lab Results:  CBC    Component Value Date/Time   WBC 3.9 (L) 05/15/2015 0901   RBC 4.22 05/15/2015 0901   HGB 12.7 05/15/2015 0901   HCT 37.5 05/15/2015 0901   PLT 235.0 05/15/2015 0901   MCV 89.0 05/15/2015 0901   MCH 30.6 09/23/2013 0512   MCHC 33.9 05/15/2015 0901   RDW 13.8 05/15/2015 0901   LYMPHSABS 1.0 05/15/2015 0901   MONOABS 0.3 05/15/2015 0901   EOSABS 0.1 05/15/2015 0901   BASOSABS 0.0 05/15/2015 0901    BMET    Component Value Date/Time   NA 140 12/06/2018 1052   K 4.2 12/06/2018 1052   CL 103 12/06/2018 1052   CO2 28 12/06/2018 1052   GLUCOSE 76 12/06/2018 1052   BUN 17 12/06/2018 1052   CREATININE 0.80 12/06/2018 1052   CALCIUM 9.4 12/06/2018 1052   GFRNONAA 84 (L) 09/21/2013 0455   GFRAA >90 09/21/2013 0455    BNP No results found for: BNP  ProBNP No results found for: PROBNP  Imaging: No results found.   Assessment & Plan:   OSA on CPAP - 100% compliant with CPAP and reports benefit from use - Pressure 11cm H20; AHI 7.8 - Increase CPAP pressure to 12cm h20 (Download in 4 weeks) - Continue to wear CPAP every night for 4-6 hours or more - Do not drive if experiencing excessive daytime fatigue or  somnolence - Contact [facility] to help change heat/cooling setting on machine  - FU in 1 year    Martyn Ehrich, NP 04/18/2019

## 2019-06-06 ENCOUNTER — Ambulatory Visit: Payer: Medicare HMO | Admitting: Family

## 2019-06-12 ENCOUNTER — Ambulatory Visit (INDEPENDENT_AMBULATORY_CARE_PROVIDER_SITE_OTHER): Payer: Medicare HMO | Admitting: Family

## 2019-06-12 ENCOUNTER — Other Ambulatory Visit: Payer: Self-pay

## 2019-06-12 ENCOUNTER — Encounter: Payer: Self-pay | Admitting: Family

## 2019-06-12 VITALS — BP 142/63 | HR 65 | Temp 96.6°F | Resp 16 | Ht 64.5 in | Wt 191.2 lb

## 2019-06-12 DIAGNOSIS — R21 Rash and other nonspecific skin eruption: Secondary | ICD-10-CM

## 2019-06-12 DIAGNOSIS — E2839 Other primary ovarian failure: Secondary | ICD-10-CM | POA: Diagnosis not present

## 2019-06-12 DIAGNOSIS — Z23 Encounter for immunization: Secondary | ICD-10-CM

## 2019-06-12 DIAGNOSIS — E038 Other specified hypothyroidism: Secondary | ICD-10-CM

## 2019-06-12 DIAGNOSIS — I10 Essential (primary) hypertension: Secondary | ICD-10-CM

## 2019-06-12 LAB — BASIC METABOLIC PANEL
BUN: 12 mg/dL (ref 6–23)
CO2: 28 mEq/L (ref 19–32)
Calcium: 9.5 mg/dL (ref 8.4–10.5)
Chloride: 106 mEq/L (ref 96–112)
Creatinine, Ser: 0.73 mg/dL (ref 0.40–1.20)
GFR: 77.88 mL/min (ref >60.00)
Glucose, Bld: 88 mg/dL (ref 70–99)
Potassium: 3.8 mEq/L (ref 3.5–5.1)
Sodium: 142 mEq/L (ref 135–145)

## 2019-06-12 LAB — TSH: TSH: 0.27 u[IU]/mL — ABNORMAL LOW (ref 0.35–4.50)

## 2019-06-12 MED ORDER — BETAMETHASONE VALERATE 0.1 % EX OINT: 1.0000 "application " | TOPICAL_OINTMENT | Freq: Two times a day (BID) | CUTANEOUS | 0 refills | Status: DC

## 2019-06-12 NOTE — Progress Notes (Signed)
Subjective:    Patient ID: [name], female    DOB: May 23, 1946, 73 y.o.   MRN: [id number]  HPI  Patient is a 73 yr old female who presents today for follow up.  Hypothyroid- she is maintained on armour thyroid. Reports that she has been feeling well on current dose.  Wt Readings from Last 3 Encounters:  06/12/19 191 lb 3.2 oz (86.7 kg)  04/18/19 193 lb 6.4 oz (87.7 kg)  12/06/18 183 lb 9.6 oz (83.3 kg)     Lab Results  Component Value Date   TSH 2.03 12/06/2018   Skin rash- reports skin rash on chest. Started about 10 days ago after she did some outdoor work. Applying benadryl cream with brief improvement.   HTN- maintained on lisinopril. She denies cp or sob.  BP Readings from Last 3 Encounters:  06/12/19 (!) 142/63  04/18/19 122/82  12/06/18 125/71     Review of Systems    see HPI  Past Medical History:  Diagnosis Date  . Arthritis   . Asthma    mild  . H/O bronchitis   . Hx of blood clots 1967   from birth control in LUE  . Hypertension   . Hypoglycemia   . Hypothyroidism   . Migraine    from food  . Pneumonia    hx of  . Seasonal allergies   . Sleep apnea   . UTI (urinary tract infection)      Social History   Socioeconomic History  . Marital status: Legally Separated    Spouse name: Not on file  . Number of children: Not on file  . Years of education: Not on file  . Highest education level: Not on file  Occupational History  . Not on file  Social Needs  . Financial resource strain: Not on file  . Food insecurity    Worry: Not on file    Inability: Not on file  . Transportation needs    Medical: Not on file    Non-medical: Not on file  Tobacco Use  . Smoking status: Former Smoker    Packs/day: 3.00    Years: 13.00    Pack years: 39.00    Types: Cigarettes    Quit date: 10/11/1974    Years since quitting: 44.6  . Smokeless tobacco: Never Used  Substance and Sexual Activity  . Alcohol use: No    Alcohol/week: 0.0 standard  drinks  . Drug use: No  . Sexual activity: Never  Lifestyle  . Physical activity    Days per week: Not on file    Minutes per session: Not on file  . Stress: Not on file  Relationships  . Social Herbalist on phone: Not on file    Gets together: Not on file    Attends religious service: Not on file    Active member of club or organization: Not on file    Attends meetings of clubs or organizations: Not on file    Relationship status: Not on file  . Intimate partner violence    Fear of current or ex partner: Not on file    Emotionally abused: Not on file    Physically abused: Not on file    Forced sexual activity: Not on file  Other Topics Concern  . Not on file  Social History Narrative  . Not on file    Past Surgical History:  Procedure Laterality Date  . CATARACT EXTRACTION  rt eye  . COLONOSCOPY    . detatched retina  2003   rt eye  . 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 Rheum arthritis Mother   . Thyroid disease Mother   . Rheum arthritis Father   . Thyroid disease Father   . Rheum arthritis Maternal Grandmother   . Rheum arthritis Maternal Grandfather   . Rheum arthritis Paternal Grandmother     Allergies  Allergen Reactions  . Codeine Hives and Nausea And Vomiting  . Pork-Derived Products Other (See Comments)    Migraines w/ pork and other processed meats.  . Prednisone Other (See Comments)    Causes more pain    Current Outpatient Medications on File Prior to Visit  Medication Sig Dispense Refill  . acetaminophen (TYLENOL) 500 MG tablet Take 500-1,000 mg by mouth as needed for mild pain or moderate pain.     Marland Kitchen betamethasone dipropionate (DIPROLENE) 0.05 % cream Apply twice daily to eczema on hands as needed 30 g 1  . Cholecalciferol (VITAMIN D)  2000 UNITS CAPS Take 2,000 Units by mouth 2 (two) times daily.     . cyanocobalamin 1000 MCG tablet Take 1,000 mcg by mouth daily.    . fexofenadine (ALLEGRA) 60 MG tablet Take 60 mg by mouth daily.    Marland Kitchen lisinopril (ZESTRIL) 20 MG tablet TAKE 1 TABLET BY MOUTH AT BEDTIME 90 tablet 0  . nystatin (MYCOSTATIN/NYSTOP) powder Apply topically 2 (two) times daily as needed. Beneath both breasts 15 g 0  . thyroid (ARMOUR) 15 MG tablet Take 1 tablet (15 mg total) by mouth daily. 30 tablet 5  . Zoster Vaccine Adjuvanted [facility]) injection 0.35ml IM now and again in 2-6 months 0.5 mL 1   No current facility-administered medications on file prior to visit.     BP (!) 142/63 (BP Location: Right Arm, Patient Position: Sitting, Cuff Size: Small)   Pulse 65   Temp (!) 96.6 F (35.9 C) (Temporal)   Resp 16   Ht 5' 4.5" (1.638 m)   Wt 191 lb 3.2 oz (86.7 kg)   SpO2 98%   BMI 32.31 kg/m    Objective:   Physical Exam Constitutional:      Appearance: She is well-developed.  Neck:     Musculoskeletal: Neck supple.     Thyroid: No thyromegaly.  Cardiovascular:     Rate and Rhythm: Normal rate and regular rhythm.     Heart sounds: Normal heart sounds. No murmur.  Pulmonary:     Effort: Pulmonary effort is normal. No respiratory distress.     Breath sounds: Normal breath sounds. No wheezing.  Skin:    General: Skin is warm and dry.     Findings: Rash (mild erythematous rash noted on upper chest) present.  Neurological:     Mental Status: She is alert and oriented to person, place, and time.  Psychiatric:        Behavior: Behavior normal.        Thought Content: Thought content normal.        Judgment: Judgment normal.           Assessment & Plan:  Hypothyroid- tsh low, will decrease armour thyroid and plan to recheck in 6 weeks.   Skin rash- poison ivy dermatitis- mild symptoms. Rx with topical  steroid cream.  HTN- blood pressure acceptable for her age.  Continue current medication.

## 2019-06-12 NOTE — Patient Instructions (Signed)
Please complete lab work prior to leaving.   

## 2019-06-13 ENCOUNTER — Telehealth: Payer: Self-pay | Admitting: Family

## 2019-06-13 ENCOUNTER — Other Ambulatory Visit: Payer: Self-pay

## 2019-06-13 DIAGNOSIS — E039 Hypothyroidism, unspecified: Secondary | ICD-10-CM

## 2019-06-13 MED ORDER — THYROID 15 MG PO TABS: 7.5000 mg | ORAL_TABLET | Freq: Every day | ORAL | 5 refills | Status: DC

## 2019-06-13 NOTE — Telephone Encounter (Signed)
betamethasone valerate ointment (VALISONE) 0.1 %  Need to verify the allergy reaction the pt has to steroids b/c this is steroid based.  please call to advise before filling

## 2019-06-13 NOTE — Telephone Encounter (Signed)
Spoke w/ Hui at [facility]- okay to fill.

## 2019-06-13 NOTE — Telephone Encounter (Signed)
Results and provider's advised given to patient. Scheduled for repeat tsh 07-25-2019

## 2019-06-13 NOTE — Telephone Encounter (Signed)
Please advise 

## 2019-06-13 NOTE — Progress Notes (Signed)
t

## 2019-06-13 NOTE — Telephone Encounter (Signed)
Chart says prednisone "causes increased pain." OK fill a topical steroid cream.

## 2019-06-13 NOTE — Telephone Encounter (Signed)
Please contact pt and let her know that her thyroid medication looks to be too strong. I would like her to continue the 15 mg dose of armour thyroid but take 1/2 tab once daily. Repeat tsh in 6 weeks, dx hypothyroid.

## 2019-06-27 DIAGNOSIS — R29890 Loss of height: Secondary | ICD-10-CM | POA: Diagnosis not present

## 2019-06-27 DIAGNOSIS — Z8262 Family history of osteoporosis: Secondary | ICD-10-CM | POA: Diagnosis not present

## 2019-06-27 DIAGNOSIS — M8589 Other specified disorders of bone density and structure, multiple sites: Secondary | ICD-10-CM | POA: Diagnosis not present

## 2019-06-27 DIAGNOSIS — Z1231 Encounter for screening mammogram for malignant neoplasm of breast: Secondary | ICD-10-CM | POA: Diagnosis not present

## 2019-06-27 DIAGNOSIS — Z96641 Presence of right artificial hip joint: Secondary | ICD-10-CM | POA: Diagnosis not present

## 2019-06-27 DIAGNOSIS — Z780 Asymptomatic menopausal state: Secondary | ICD-10-CM | POA: Diagnosis not present

## 2019-06-27 LAB — HM MAMMOGRAPHY

## 2019-07-04 ENCOUNTER — Telehealth: Payer: Self-pay | Admitting: Family

## 2019-07-04 ENCOUNTER — Encounter: Payer: Self-pay | Admitting: Family

## 2019-07-04 DIAGNOSIS — M8580 Other specified disorders of bone density and structure, unspecified site: Secondary | ICD-10-CM | POA: Insufficient documentation

## 2019-07-04 HISTORY — DX: Other specified disorders of bone density and structure, unspecified site: M85.80

## 2019-07-04 MED ORDER — CALCIUM CARBONATE-VITAMIN D 600-400 MG-UNIT PO CHEW: 1.0000 | CHEWABLE_TABLET | Freq: Two times a day (BID) | ORAL | Status: DC

## 2019-07-04 NOTE — Telephone Encounter (Signed)
Please advise pt that I reviewed her bone density from [facility] and it notes mild bone thinning.   I would recommend that she add caltrate +D 600 mg twice daily and ensure regular weight bearing exercise such as walking.  Bone density should be repeated in 2 years.

## 2019-07-04 NOTE — Telephone Encounter (Signed)
Results given to patient, she verbalized understanding. 

## 2019-07-10 ENCOUNTER — Other Ambulatory Visit: Payer: Self-pay | Admitting: Family

## 2019-07-10 MED ORDER — CALCIUM CARBONATE-VITAMIN D 600-400 MG-UNIT PO CHEW: 1.0000 | CHEWABLE_TABLET | Freq: Two times a day (BID) | ORAL | Status: DC

## 2019-07-10 NOTE — Telephone Encounter (Signed)
Requested medication (s) are due for refill today:  yes  Requested medication (s) are on the active medication list:  yes  Future visit scheduled:    Last Refill: new recommendation to take OTC (see note 07/04/19)  Note to clinic:  Called pt. To advise of recommendation to start Caltrate with Vita D 600 units BID, over the counter.  The pt. Stated she understood that a prescription would be called in.   Reported she has been taking Vita D 3, 1000 units, 2 tabs daily for quite a long time.  Advised that a calcium supplement is now being recommended.  Pt. Is asking if PCP will advise on need to continue to take Vita D 3 daily, and to start the Calcium with Vita D.  Also asked PCP to advise on if previous higher doses of Thyroid medication she was on, contributed to bone thinning.  Pt. Is requesting a follow-up call, with explanation/ recommendations.    Requested Prescriptions  Pending Prescriptions Disp Refills   Calcium Carbonate-Vitamin D 600-400 MG-UNIT chew tablet       Sig: Chew 1 tablet by mouth 2 (two) times daily.     There is no refill protocol information for this order

## 2019-07-10 NOTE — Telephone Encounter (Signed)
Calcium Carbonate-Vitamin D 600-400 MG-UNIT chew tablet   07/04/2019    Sig - Route: Chew 1 tablet by mouth 2 (two) times daily. - Oral   Class: OTC    [facility] [address] [place], [place] (Phone) [phone] (Fax)   Shows as sent but pharmacy did not receive/ resend

## 2019-07-17 ENCOUNTER — Telehealth: Payer: Self-pay

## 2019-07-17 NOTE — Telephone Encounter (Signed)
Per Melissa patient has been advised her bone thinning may be related to aging and to take calcium with d 600mg  with 400 units.

## 2019-07-17 NOTE — Telephone Encounter (Signed)
Copied from [place] [id number]. Topic: General - Inquiry >> Jul 16, 2019  5:05 PM Percell Belt A wrote: Reason for CRM: pt called in and stated that the [facility] Thyroid that she has been taking has been recalled because it was twice as high has they stated on the bottle.  Pt would like to know if this could have something to do with her bones and why her DEXA scan was so low.  She stated she still does not understand how she should be taking the calcium .  [phone]

## 2019-07-20 DIAGNOSIS — G4733 Obstructive sleep apnea (adult) (pediatric): Secondary | ICD-10-CM | POA: Diagnosis not present

## 2019-07-24 ENCOUNTER — Telehealth: Payer: Self-pay | Admitting: *Deleted

## 2019-07-24 NOTE — Telephone Encounter (Signed)
Patient notified we will provide a copy of her last labs tomorrow

## 2019-07-24 NOTE — Telephone Encounter (Signed)
Copied from [place] [id number]. Topic: General - Other >> Jul 24, 2019 11:01 AM Celene Kras A wrote: Reason for CRM: Pt called and is requesting to have her vitamin D levels checked at her appt on 07/25/2019. Pt is also requesting to have a copy of her most recent labs and to have a copy of her T levels for her bone density test done last month. Please advise.

## 2019-07-24 NOTE — Telephone Encounter (Signed)
Noted will address at her appointment tomorrow.

## 2019-07-25 ENCOUNTER — Other Ambulatory Visit: Payer: Self-pay

## 2019-07-25 ENCOUNTER — Other Ambulatory Visit (INDEPENDENT_AMBULATORY_CARE_PROVIDER_SITE_OTHER): Payer: Medicare HMO

## 2019-07-25 DIAGNOSIS — E039 Hypothyroidism, unspecified: Secondary | ICD-10-CM

## 2019-07-25 LAB — TSH: TSH: 1.91 u[IU]/mL (ref 0.35–4.50)

## 2019-08-08 ENCOUNTER — Other Ambulatory Visit: Payer: Self-pay | Admitting: Family

## 2019-08-20 DIAGNOSIS — G4733 Obstructive sleep apnea (adult) (pediatric): Secondary | ICD-10-CM | POA: Diagnosis not present

## 2019-08-27 ENCOUNTER — Telehealth: Payer: Self-pay | Admitting: Family

## 2019-08-27 MED ORDER — LEVOTHYROXINE SODIUM 25 MCG PO TABS: 12.5000 ug | ORAL_TABLET | Freq: Every day | ORAL | 1 refills | Status: DC

## 2019-08-27 NOTE — Telephone Encounter (Signed)
Stop armour thyroid, start synthroid 44mcg (1/2 tab)  once daily, repeat TSH in 5 weeks.

## 2019-08-27 NOTE — Telephone Encounter (Signed)
Pt called stating she is needing to change her thyroid medication to levothyroxine so that it is covered by her insurance again. Please advise.   [facility], [place] [address]  Phone: [phone] Fax: [phone]  Not a 24 hour pharmacy; exact hours not known.

## 2019-08-28 ENCOUNTER — Other Ambulatory Visit: Payer: Self-pay

## 2019-08-28 DIAGNOSIS — E039 Hypothyroidism, unspecified: Secondary | ICD-10-CM

## 2019-08-28 NOTE — Telephone Encounter (Signed)
Patient advised new rx was sent in as requested.

## 2019-09-19 DIAGNOSIS — G4733 Obstructive sleep apnea (adult) (pediatric): Secondary | ICD-10-CM | POA: Diagnosis not present

## 2019-10-01 DIAGNOSIS — R69 Illness, unspecified: Secondary | ICD-10-CM | POA: Diagnosis not present

## 2019-10-01 DIAGNOSIS — L570 Actinic keratosis: Secondary | ICD-10-CM | POA: Diagnosis not present

## 2019-10-20 DIAGNOSIS — G4733 Obstructive sleep apnea (adult) (pediatric): Secondary | ICD-10-CM | POA: Diagnosis not present

## 2019-10-24 ENCOUNTER — Other Ambulatory Visit: Payer: Self-pay

## 2019-10-24 ENCOUNTER — Other Ambulatory Visit (INDEPENDENT_AMBULATORY_CARE_PROVIDER_SITE_OTHER): Payer: Medicare HMO

## 2019-10-24 DIAGNOSIS — E039 Hypothyroidism, unspecified: Secondary | ICD-10-CM

## 2019-10-24 LAB — TSH: TSH: 5.81 u[IU]/mL — ABNORMAL HIGH (ref 0.35–4.50)

## 2019-10-25 ENCOUNTER — Telehealth: Payer: Self-pay | Admitting: Family

## 2019-10-25 MED ORDER — LEVOTHYROXINE SODIUM 25 MCG PO TABS: 25.0000 ug | ORAL_TABLET | Freq: Every day | ORAL | 3 refills | Status: DC

## 2019-10-25 NOTE — Telephone Encounter (Signed)
Informed patient of results and medication change, verbalized understanding. Patient has f/u appt with PCP on 12/11/2019, will repeat TSH at that time.

## 2019-10-25 NOTE — Telephone Encounter (Signed)
Lab work shows synthroid needs to be increased to a full tab once daily.  Please increase to a full tab once daily.  Repeat tsh in 6 weeks. Dx hypothyroid.

## 2019-11-15 NOTE — Progress Notes (Signed)
Virtual Visit via Audio Note  I connected with patient on 11/16/19 at  9:30 AM EST by audio enabled telemedicine application and verified that I am speaking with the correct person using two identifiers.   THIS ENCOUNTER IS A VIRTUAL VISIT DUE TO COVID-19 - PATIENT WAS NOT SEEN IN THE OFFICE. PATIENT HAS CONSENTED TO VIRTUAL VISIT / TELEMEDICINE VISIT   Location of patient: home  Location of provider: office  I discussed the limitations of evaluation and management by telemedicine and the availability of in person appointments. The patient expressed understanding and agreed to proceed.   Subjective:   [name] is a 74 y.o. female who presents for Medicare Annual (Subsequent) preventive examination.  Review of Systems:  Home Safety/Smoke Alarms: Feels safe in home. Smoke alarms in place.  Lives alone in 1 story home. Dtr and grandson stay with her often.   Female:       Mammo-06/27/19       Dexa scan- 06/27/19 [facility]- 2009. Pt states she will schedule once she gets a few other things taken care of.     Objective:     Vitals: Unable to assess. This visit is enabled though telemedicine due to Covid 19.   Advanced Directives 11/16/2019 11/30/2016 02/04/2016 09/20/2013 09/11/2013  Does Patient Have a Medical Advance Directive? No Yes No Patient does not have advance directive;Patient would like information Patient does not have advance directive;Patient would like information  Does patient want to make changes to medical advance directive? - Yes (MAU/Ambulatory/Procedural Areas - Information given) - - -  Would patient like information on creating a medical advance directive? No - Patient declined - No - patient declined information - Advance directive packet given  Pre-existing out of facility DNR order (yellow form or pink MOST form) - - - No No    Tobacco Social History   Tobacco Use  Smoking Status Former Smoker  . Packs/day: 3.00  . Years: 13.00  . Pack years: 39.00  .  Types: Cigarettes  . Quit date: 10/11/1974  . Years since quitting: 45.1  Smokeless Tobacco Never Used     Counseling given: Not Answered   Clinical Intake:     Pain : No/denies pain     Past Medical History:  Diagnosis Date  . Arthritis   . Asthma    mild  . H/O bronchitis   . Hx of blood clots 1967   from birth control in LUE  . Hypertension   . Hypoglycemia   . Hypothyroidism   . Migraine    from food  . Osteopenia 07/04/2019  . Pneumonia    hx of  . Seasonal allergies   . Sleep apnea   . UTI (urinary tract infection)    Past Surgical History:  Procedure Laterality Date  . CATARACT EXTRACTION     rt eye  . COLONOSCOPY    . detatched retina  2003   rt eye  . 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 Rheum arthritis Mother   . Thyroid disease Mother   . Rheum arthritis Father   . Thyroid disease Father   . Rheum arthritis Maternal Grandmother   . Rheum arthritis Maternal Grandfather   . Rheum arthritis Paternal Grandmother    Social History   Socioeconomic History  .  Marital status: Legally Separated    Spouse name: Not on file  . Number of children: Not on file  . Years of education: Not on file  . Highest education level: Not on file  Occupational History  . Not on file  Tobacco Use  . Smoking status: Former Smoker    Packs/day: 3.00    Years: 13.00    Pack years: 39.00    Types: Cigarettes    Quit date: 10/11/1974    Years since quitting: 45.1  . Smokeless tobacco: Never Used  Substance and Sexual Activity  . Alcohol use: No    Alcohol/week: 0.0 standard drinks  . Drug use: No  . Sexual activity: Never  Other Topics Concern  . Not on file  Social History Narrative  . Not on file   Social Determinants of Health   Financial Resource Strain: Low Risk     . Difficulty of Paying Living Expenses: Not very hard  Food Insecurity: No Food Insecurity  . Worried About Charity fundraiser in the Last Year: Never true  . Ran Out of Food in the Last Year: Never true  Transportation Needs: No Transportation Needs  . Lack of Transportation (Medical): No  . Lack of Transportation (Non-Medical): No  Physical Activity:   . Days of Exercise per Week: Not on file  . Minutes of Exercise per Session: Not on file  Stress:   . Feeling of Stress : Not on file  Social Connections:   . Frequency of Communication with Friends and Family: Not on file  . Frequency of Social Gatherings with Friends and Family: Not on file  . Attends Religious Services: Not on file  . Active Member of Clubs or Organizations: Not on file  . Attends Archivist Meetings: Not on file  . Marital Status: Not on file    Outpatient Encounter Medications as of 11/16/2019  Medication Sig  . acetaminophen (TYLENOL) 500 MG tablet Take 500-1,000 mg by mouth as needed for mild pain or moderate pain.   Marland Kitchen betamethasone dipropionate (DIPROLENE) 0.05 % cream Apply twice daily to eczema on hands as needed  . betamethasone valerate ointment (VALISONE) 0.1 % Apply 1 application topically 2 (two) times daily.  . Cholecalciferol (VITAMIN D) 2000 UNITS CAPS Take 2,000 Units by mouth 2 (two) times daily.   . cyanocobalamin 1000 MCG tablet Take 1,000 mcg by mouth daily.  . fexofenadine (ALLEGRA) 60 MG tablet Take 60 mg by mouth daily.  Marland Kitchen levothyroxine (SYNTHROID) 25 MCG tablet Take 1 tablet (25 mcg total) by mouth daily before breakfast.  . lisinopril (ZESTRIL) 20 MG tablet TAKE 1 TABLET BY MOUTH AT BEDTIME  . nystatin (MYCOSTATIN/NYSTOP) powder Apply topically 2 (two) times daily as needed. Beneath both breasts  . Zoster Vaccine Adjuvanted [facility]) injection 0.65ml IM now and again in 2-6 months  . Calcium Carbonate-Vitamin D 600-400 MG-UNIT chew tablet Chew 1 tablet by mouth 2 (two) times  daily. (Patient not taking: Reported on 11/16/2019)   No facility-administered encounter medications on file as of 11/16/2019.    Activities of Daily Living In your present state of health, do you have any difficulty performing the following activities: 11/16/2019  Hearing? N  Vision? N  Difficulty concentrating or making decisions? N  Walking or climbing stairs? N  Dressing or bathing? N  Doing errands, shopping? N  Preparing Food and eating ? N  Using the Toilet? N  In the past six months, have you accidently leaked urine?  N  Do you have problems with loss of bowel control? N  Managing your Medications? N  Managing your Finances? N  Housekeeping or managing your Housekeeping? N  Some recent data might be hidden    Patient Care Team: Debbrah Alar, NP as PCP - General (Internal Medicine) Latanya Maudlin, MD as Consulting Physician (Orthopedic Surgery) Rigoberto Noel, MD as Consulting Physician (Pulmonary Disease)    Assessment:   This is a routine wellness examination for Rechy Gratzer. Physical assessment deferred to PCP.  Exercise Activities and Dietary recommendations Current Exercise Habits: The patient does not participate in regular exercise at present, Exercise limited by: None identified Diet (meal preparation, eat out, water intake, caffeinated beverages, dairy products, fruits and vegetables): well balanced  Goals    . Increase physical activity       Fall Risk Fall Risk  11/16/2019 12/02/2017 11/30/2016 11/19/2015 05/15/2015  Falls in the past year? 1 No No No No  Comment pt tripped walking out of [employer] - - - -  Number falls in past yr: 0 - - - -  Injury with Fall? 0 - - - -  Follow up Education provided;Falls prevention discussed - - - -   Depression Screen PHQ 2/9 Scores 11/16/2019 06/07/2018 05/30/2017 11/30/2016  PHQ - 2 Score 0 0 0 0  PHQ- 9 Score - 0 1 -     Cognitive Function Ad8 score reviewed for issues:  Issues making decisions:no  Less interest  in hobbies / activities:no  Repeats questions, stories (family complaining):no  Trouble using ordinary gadgets (microwave, computer, phone):no  Forgets the month or year: no  Mismanaging finances: no  Remembering appts:no  Daily problems with thinking and/or memory:no Ad8 score is=0  MMSE - Mini Mental State Exam 11/30/2016  Orientation to time 5  Orientation to Place 5  Registration 3  Attention/ Calculation 5  Recall 3  Language- name 2 objects 2  Language- repeat 1  Language- follow 3 step command 3  Language- read & follow direction 1  Write a sentence 1  Copy design 1  Total score 30        Immunization History  Administered Date(s) Administered  . Fluad Quad(high Dose 65+) 06/12/2019  . Influenza Split 07/20/2011, 07/05/2012  . Influenza Whole 08/31/2007, 07/18/2008, 07/17/2009, 07/09/2010  . Influenza, High Dose Seasonal PF 08/04/2013, 07/04/2015, 07/21/2016, 07/22/2017, 08/07/2018  . Influenza,inj,Quad PF,6+ Mos 08/06/2014  . Pneumococcal Conjugate-13 11/14/2014  . Pneumococcal Polysaccharide-23 09/27/2007, 11/19/2015  . Td 03/27/2008  . Zoster 11/02/2011    Screening Tests Health Maintenance  Topic Date Due  . TETANUS/TDAP  03/27/2018  . COLONOSCOPY  05/13/2018  . MAMMOGRAM  06/26/2021  . INFLUENZA VACCINE  Completed  . DEXA SCAN  Completed  . Hepatitis C Screening  Completed  . PNA vac Low Risk Adult  Completed       Plan:   See you next year!  Continue to eat heart healthy diet (full of fruits, vegetables, whole grains, lean protein, water--limit salt, fat, and sugar intake) and increase physical activity as tolerated.  Continue doing brain stimulating activities (puzzles, reading, adult coloring books, staying active) to keep memory sharp.   Bring a copy of your living will and/or healthcare power of attorney to your next office visit.    I have personally reviewed and noted the following in the patient's chart:   . Medical and social  history . Use of alcohol, tobacco or illicit drugs  . Current medications and supplements .  Functional ability and status . Nutritional status . Physical activity . Advanced directives . List of other physicians . Hospitalizations, surgeries, and ER visits in previous 12 months . Vitals . Screenings to include cognitive, depression, and falls . Referrals and appointments  In addition, I have reviewed and discussed with patient certain preventive protocols, quality metrics, and best practice recommendations. A written personalized care plan for preventive services as well as general preventive health recommendations were provided to patient.     Naaman Plummer [place], [place]  11/16/2019

## 2019-11-16 ENCOUNTER — Other Ambulatory Visit: Payer: Self-pay

## 2019-11-16 ENCOUNTER — Ambulatory Visit (INDEPENDENT_AMBULATORY_CARE_PROVIDER_SITE_OTHER): Payer: Medicare HMO | Admitting: *Deleted

## 2019-11-16 ENCOUNTER — Encounter: Payer: Self-pay | Admitting: *Deleted

## 2019-11-16 ENCOUNTER — Telehealth: Payer: Self-pay | Admitting: Family

## 2019-11-16 DIAGNOSIS — Z0000 Encounter for general adult medical examination without abnormal findings: Secondary | ICD-10-CM | POA: Diagnosis not present

## 2019-11-16 NOTE — Telephone Encounter (Signed)
I'm not able to take patient at this time due to 2 providers here at [facility] leaving in the past week and taking on a majority of their patient panels. They are welcome to establish with Wilfred Lacy or Dr. Abelino Derrick here at [facility]

## 2019-11-16 NOTE — Telephone Encounter (Signed)
Patient is requesting a transfer of care from [facility] to [facility] [employer].

## 2019-11-16 NOTE — Patient Instructions (Signed)
See you next year!  Continue to eat heart healthy diet (full of fruits, vegetables, whole grains, lean protein, water--limit salt, fat, and sugar intake) and increase physical activity as tolerated.  Continue doing brain stimulating activities (puzzles, reading, adult coloring books, staying active) to keep memory sharp.   Bring a copy of your living will and/or healthcare power of attorney to your next office visit.   [name] , Thank you for taking time to come for your Medicare Wellness Visit. I appreciate your ongoing commitment to your health goals. Please review the following plan we discussed and let me know if I can assist you in the future.   These are the goals we discussed: Goals    . Increase physical activity       This is a list of the screening recommended for you and due dates:  Health Maintenance  Topic Date Due  . Tetanus Vaccine  03/27/2018  . Colon Cancer Screening  05/13/2018  . Mammogram  06/26/2021  . Flu Shot  Completed  . DEXA scan (bone density measurement)  Completed  .  Hepatitis C: One time screening is recommended by Center for Disease Control  (CDC) for  adults born from [birth year] through 1966.   Completed  . Pneumonia vaccines  Completed    Preventive Care 30 Years and Older, Female Preventive care refers to lifestyle choices and visits with your health care provider that can promote health and wellness. This includes:  A yearly physical exam. This is also called an annual well check.  Regular dental and eye exams.  Immunizations.  Screening for certain conditions.  Healthy lifestyle choices, such as diet and exercise. What can I expect for my preventive care visit? Physical exam Your health care provider will check:  Height and weight. These may be used to calculate body mass index (BMI), which is a measurement that tells if you are at a healthy weight.  Heart rate and blood pressure.  Your skin for abnormal spots. Counseling Your health  care provider may ask you questions about:  Alcohol, tobacco, and drug use.  Emotional well-being.  Home and relationship well-being.  Sexual activity.  Eating habits.  History of falls.  Memory and ability to understand (cognition).  Work and work Statistician.  Pregnancy and menstrual history. What immunizations do I need?  Influenza (flu) vaccine  This is recommended every year. Tetanus, diphtheria, and pertussis (Tdap) vaccine  You may need a Td booster every 10 years. Varicella (chickenpox) vaccine  You may need this vaccine if you have not already been vaccinated. Zoster (shingles) vaccine  You may need this after age 59. Pneumococcal conjugate (PCV13) vaccine  One dose is recommended after age 38. Pneumococcal polysaccharide (PPSV23) vaccine  One dose is recommended after age 72. Measles, mumps, and rubella (MMR) vaccine  You may need at least one dose of MMR if you were born in 1957 or later. You may also need a second dose. Meningococcal conjugate (MenACWY) vaccine  You may need this if you have certain conditions. Hepatitis A vaccine  You may need this if you have certain conditions or if you travel or work in places where you may be exposed to hepatitis A. Hepatitis B vaccine  You may need this if you have certain conditions or if you travel or work in places where you may be exposed to hepatitis B. Haemophilus influenzae type b (Hib) vaccine  You may need this if you have certain conditions. You may receive vaccines  as individual doses or as more than one vaccine together in one shot (combination vaccines). Talk with your health care provider about the risks and benefits of combination vaccines. What tests do I need? Blood tests  Lipid and cholesterol levels. These may be checked every 5 years, or more frequently depending on your overall health.  Hepatitis C test.  Hepatitis B test. Screening  Lung cancer screening. You may have this  screening every year starting at age 47 if you have a 30-pack-year history of smoking and currently smoke or have quit within the past 15 years.  Colorectal cancer screening. All adults should have this screening starting at age 56 and continuing until age 75. Your health care provider may recommend screening at age 73 if you are at increased risk. You will have tests every 1-10 years, depending on your results and the type of screening test.  Diabetes screening. This is done by checking your blood sugar (glucose) after you have not eaten for a while (fasting). You may have this done every 1-3 years.  Mammogram. This may be done every 1-2 years. Talk with your health care provider about how often you should have regular mammograms.  BRCA-related cancer screening. This may be done if you have a family history of breast, ovarian, tubal, or peritoneal cancers. Other tests  Sexually transmitted disease (STD) testing.  Bone density scan. This is done to screen for osteoporosis. You may have this done starting at age 77. Follow these instructions at home: Eating and drinking  Eat a diet that includes fresh fruits and vegetables, whole grains, lean protein, and low-fat dairy products. Limit your intake of foods with high amounts of sugar, saturated fats, and salt.  Take vitamin and mineral supplements as recommended by your health care provider.  Do not drink alcohol if your health care provider tells you not to drink.  If you drink alcohol: ? Limit how much you have to 0-1 drink a day. ? Be aware of how much alcohol is in your drink. In the [place] one drink equals one 12 oz bottle of beer (355 mL), one 5 oz glass of wine (148 mL), or one 1 oz glass of hard liquor (44 mL). Lifestyle  Take daily care of your teeth and gums.  Stay active. Exercise for at least 30 minutes on 5 or more days each week.  Do not use any products that contain nicotine or tobacco, such as cigarettes, e-cigarettes,  and chewing tobacco. If you need help quitting, ask your health care provider.  If you are sexually active, practice safe sex. Use a condom or other form of protection in order to prevent STIs (sexually transmitted infections).  Talk with your health care provider about taking a low-dose aspirin or statin. What's next?  Go to your health care provider once a year for a well check visit.  Ask your health care provider how often you should have your eyes and teeth checked.  Stay up to date on all vaccines. This information is not intended to replace advice given to you by your health care provider. Make sure you discuss any questions you have with your health care provider. Document Revised: 09/21/2018 Document Reviewed: 09/21/2018 Elsevier Patient Education  2020 [employer].

## 2019-11-20 DIAGNOSIS — G4733 Obstructive sleep apnea (adult) (pediatric): Secondary | ICD-10-CM | POA: Diagnosis not present

## 2019-11-23 NOTE — Telephone Encounter (Signed)
I called and informed patient that Dr. Bryan Lemma would not be able to see her. Patient asked if she could TOC to Dr. Ethelene Hal. Please advised.

## 2019-11-26 NOTE — Telephone Encounter (Signed)
Okay for transfer 

## 2019-11-26 NOTE — Telephone Encounter (Signed)
Dr. Ethelene Hal pt would like TOC from [facility] location to [place]. Dr. Bryan Lemma is not able to take the patient would you be able to see her? Please advise.

## 2019-12-11 ENCOUNTER — Ambulatory Visit: Payer: Medicare HMO | Admitting: Family

## 2019-12-18 DIAGNOSIS — G4733 Obstructive sleep apnea (adult) (pediatric): Secondary | ICD-10-CM | POA: Diagnosis not present

## 2019-12-24 ENCOUNTER — Other Ambulatory Visit: Payer: Self-pay

## 2019-12-25 ENCOUNTER — Encounter: Payer: Self-pay | Admitting: Family Medicine

## 2019-12-25 ENCOUNTER — Ambulatory Visit (INDEPENDENT_AMBULATORY_CARE_PROVIDER_SITE_OTHER): Payer: Medicare HMO | Admitting: Family Medicine

## 2019-12-25 VITALS — BP 138/70 | HR 41 | Temp 96.5°F | Ht 64.0 in | Wt 195.0 lb

## 2019-12-25 DIAGNOSIS — B351 Tinea unguium: Secondary | ICD-10-CM | POA: Diagnosis not present

## 2019-12-25 DIAGNOSIS — I10 Essential (primary) hypertension: Secondary | ICD-10-CM | POA: Diagnosis not present

## 2019-12-25 DIAGNOSIS — Z8639 Personal history of other endocrine, nutritional and metabolic disease: Secondary | ICD-10-CM

## 2019-12-25 DIAGNOSIS — E789 Disorder of lipoprotein metabolism, unspecified: Secondary | ICD-10-CM | POA: Insufficient documentation

## 2019-12-25 DIAGNOSIS — E038 Other specified hypothyroidism: Secondary | ICD-10-CM | POA: Diagnosis not present

## 2019-12-25 DIAGNOSIS — L309 Dermatitis, unspecified: Secondary | ICD-10-CM

## 2019-12-25 DIAGNOSIS — Z0000 Encounter for general adult medical examination without abnormal findings: Secondary | ICD-10-CM

## 2019-12-25 NOTE — Progress Notes (Addendum)
Established Patient Office Visit  Subjective:  Patient ID: [name], female    DOB: [birth year]  Age: 74 y.o. MRN: [id number]  CC:  Chief Complaint  Patient presents with  . Transitions Of Care    annual exam    HPI [name] presents for establishment of care by way of transfer.  She is here to follow-up on her hypertension, hypothyroidism, elevated triglycerides, history of hypo glycemia.  She recently restarted low-dose Synthroid in the last few months.  She is taking it in the morning before eating.  She had been self-medicating with over-the-counter natural product.  Blood pressure has been well controlled with the lisinopril.  She is under orthopedic care for chronic left knee pain.  She thinks she may have need for replacement at some point.  She has been seeing the dentist regularly but needs to make an appointment.  Colonoscopy 11 years ago was negative.  She does have a hemorrhoid that she would like to gastroenterologist.  Past Medical History:  Diagnosis Date  . Arthritis   . Asthma    mild  . H/O bronchitis   . Hx of blood clots 1967   from birth control in LUE  . Hypertension   . Hypoglycemia   . Hypothyroidism   . Migraine    from food  . Osteopenia 07/04/2019  . Pneumonia    hx of  . Seasonal allergies   . Sleep apnea   . UTI (urinary tract infection)     Past Surgical History:  Procedure Laterality Date  . CATARACT EXTRACTION     rt eye  . COLONOSCOPY    . detatched retina  2003   rt eye  . 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 Rheum arthritis Mother   . Thyroid disease Mother   . Rheum arthritis Father   . Thyroid disease Father   . Rheum arthritis Maternal Grandmother   . Rheum arthritis Maternal Grandfather   . Rheum arthritis Paternal  Grandmother     Social History   Socioeconomic History  . Marital status: Legally Separated    Spouse name: Not on file  . Number of children: Not on file  . Years of education: Not on file  . Highest education level: Not on file  Occupational History  . Not on file  Tobacco Use  . Smoking status: Former Smoker    Packs/day: 3.00    Years: 13.00    Pack years: 39.00    Types: Cigarettes    Quit date: 10/11/1974    Years since quitting: 45.2  . Smokeless tobacco: Never Used  Substance and Sexual Activity  . Alcohol use: No    Alcohol/week: 0.0 standard drinks  . Drug use: No  . Sexual activity: Never  Other Topics Concern  . Not on file  Social History Narrative  . Not on file   Social Determinants of Health   Financial Resource Strain: Low Risk   . Difficulty of Paying Living Expenses: Not very hard  Food Insecurity: No Food Insecurity  . Worried About Charity fundraiser in the Last Year: Never true  . Ran Out of Food in the Last Year: Never true  Transportation Needs: No Transportation Needs  . Lack of Transportation (Medical): No  .  Lack of Transportation (Non-Medical): No  Physical Activity:   . Days of Exercise per Week:   . Minutes of Exercise per Session:   Stress:   . Feeling of Stress :   Social Connections:   . Frequency of Communication with Friends and Family:   . Frequency of Social Gatherings with Friends and Family:   . Attends Religious Services:   . Active Member of Clubs or Organizations:   . Attends Archivist Meetings:   Marland Kitchen Marital Status:   Intimate Partner Violence:   . Fear of Current or Ex-Partner:   . Emotionally Abused:   Marland Kitchen Physically Abused:   . Sexually Abused:     Outpatient Medications Prior to Visit  Medication Sig Dispense Refill  . acetaminophen (TYLENOL) 500 MG tablet Take 500-1,000 mg by mouth as needed for mild pain or moderate pain.     . Cholecalciferol (VITAMIN D) 2000 UNITS CAPS Take 2,000 Units by mouth 2  (two) times daily.     . cyanocobalamin 1000 MCG tablet Take 1,000 mcg by mouth daily.    . fexofenadine (ALLEGRA) 60 MG tablet Take 60 mg by mouth daily.    Marland Kitchen lisinopril (ZESTRIL) 20 MG tablet TAKE 1 TABLET BY MOUTH AT BEDTIME 90 tablet 1  . nystatin (MYCOSTATIN/NYSTOP) powder Apply topically 2 (two) times daily as needed. Beneath both breasts 15 g 0  . Zoster Vaccine Adjuvanted [facility]) injection 0.45ml IM now and again in 2-6 months 0.5 mL 1  . betamethasone valerate ointment (VALISONE) 0.1 % Apply 1 application topically 2 (two) times daily. 30 g 0  . levothyroxine (SYNTHROID) 25 MCG tablet Take 1 tablet (25 mcg total) by mouth daily before breakfast. 30 tablet 3  . Calcium Carbonate-Vitamin D 600-400 MG-UNIT chew tablet Chew 1 tablet by mouth 2 (two) times daily. (Patient not taking: Reported on 11/16/2019)    . betamethasone dipropionate (DIPROLENE) 0.05 % cream Apply twice daily to eczema on hands as needed (Patient not taking: Reported on 12/25/2019) 30 g 1   No facility-administered medications prior to visit.    Allergies  Allergen Reactions  . Codeine Hives and Nausea And Vomiting  . Pork-Derived Products Other (See Comments)    Migraines w/ pork and other processed meats.  . Prednisone Other (See Comments)    Causes more pain    ROS Review of Systems  Constitutional: Negative.   HENT: Negative.   Respiratory: Negative.   Cardiovascular: Negative.   Gastrointestinal: Negative.   Endocrine: Negative for polyphagia and polyuria.  Genitourinary: Negative.   Musculoskeletal: Positive for arthralgias and gait problem.  Allergic/Immunologic: Negative for immunocompromised state.  Neurological: Negative for headaches.  Hematological: Negative.   Psychiatric/Behavioral: Negative.       Objective:    Physical Exam  Constitutional: She is oriented to person, place, and time. She appears well-developed and well-nourished. No distress.  HENT:  Head: Normocephalic and  atraumatic.  Right Ear: External ear normal.  Left Ear: External ear normal.  Eyes: Conjunctivae are normal. Right eye exhibits no discharge. Left eye exhibits no discharge. No scleral icterus.  Neck: No JVD present. No tracheal deviation present. No thyromegaly present.  Cardiovascular: Normal rate, regular rhythm and normal heart sounds.  Pulses:      Dorsalis pedis pulses are 2+ on the right side and 2+ on the left side.       Posterior tibial pulses are 1+ on the right side and 1+ on the left side.  Pulmonary/Chest: Effort normal  and breath sounds normal. No stridor.  Abdominal: Bowel sounds are normal.  Musculoskeletal:        General: No edema.  Lymphadenopathy:    She has no cervical adenopathy.  Neurological: She is alert and oriented to person, place, and time.  Skin: Skin is warm and dry. She is not diaphoretic.     Psychiatric: She has a normal mood and affect. Her behavior is normal.    BP 138/70   Pulse (!) 41   Temp (!) 96.5 F (35.8 C) (Tympanic)   Ht 5\' 4"  (1.626 m)   Wt 195 lb (88.5 kg)   SpO2 99%   BMI 33.47 kg/m  Wt Readings from Last 3 Encounters:  12/25/19 195 lb (88.5 kg)  06/12/19 191 lb 3.2 oz (86.7 kg)  04/18/19 193 lb 6.4 oz (87.7 kg)     Health Maintenance Due  Topic Date Due  . TETANUS/TDAP  03/27/2018  . COLONOSCOPY  05/13/2018    There are no preventive care reminders to display for this patient.  Lab Results  Component Value Date   TSH 5.12 (H) 12/26/2019   Lab Results  Component Value Date   WBC 5.0 12/26/2019   HGB 12.0 12/26/2019   HCT 36.4 12/26/2019   MCV 94.7 12/26/2019   PLT 233.0 12/26/2019   Lab Results  Component Value Date   NA 141 12/26/2019   K 4.2 12/26/2019   CO2 32 12/26/2019   GLUCOSE 79 12/26/2019   BUN 10 12/26/2019   CREATININE 0.79 12/26/2019   BILITOT 0.7 12/26/2019   ALKPHOS 67 12/26/2019   AST 17 12/26/2019   ALT 9 12/26/2019   PROT 6.7 12/26/2019   ALBUMIN 3.9 12/26/2019   CALCIUM 9.3  12/26/2019   GFR 70.99 12/26/2019   Lab Results  Component Value Date   CHOL 184 12/26/2019   Lab Results  Component Value Date   HDL 89.20 12/26/2019   Lab Results  Component Value Date   LDLCALC 74 12/26/2019   Lab Results  Component Value Date   TRIG 104.0 12/26/2019   Lab Results  Component Value Date   CHOLHDL 2 12/26/2019   Lab Results  Component Value Date   HGBA1C 5.7 12/26/2019      Assessment & Plan:   Problem List Items Addressed This Visit      Cardiovascular and Mediastinum   Essential hypertension - Primary   Relevant Orders   CBC (Completed)   Urinalysis, Routine w reflex microscopic (Completed)   Comprehensive metabolic panel (Completed)     Endocrine   Hypothyroidism   Relevant Orders   TSH (Completed)     Musculoskeletal and Integument   Onychomycosis   Relevant Orders   Ambulatory referral to [place] maintenance   Relevant Orders   LDL cholesterol, direct (Completed)   Ambulatory referral to Gastroenterology   History of hypoglycemia   Relevant Orders   Hemoglobin A1c (Completed)   Abnormal serum cholesterol   Relevant Orders   Lipid panel (Completed)    Other Visit Diagnoses    Eczema, unspecified type       Relevant Medications   triamcinolone ointment (KENALOG) 0.1 %      Meds ordered this encounter  Medications  . triamcinolone ointment (KENALOG) 0.1 %    Sig: Apply once daily to hands as needed for hand eczema only.    Dispense:  30 g    Refill:  2    Follow-up:  Return in about 3 months (around 03/26/2020).    Libby Maw, MD

## 2019-12-25 NOTE — Patient Instructions (Signed)
Health Maintenance After Age 75 After age 31, you are at a higher risk for certain long-term diseases and infections as well as injuries from falls. Falls are a major cause of broken bones and head injuries in people who are older than age 31. Getting regular preventive care can help to keep you healthy and well. Preventive care includes getting regular testing and making lifestyle changes as recommended by your health care provider. Talk with your health care provider about:  Which screenings and tests you should have. A screening is a test that checks for a disease when you have no symptoms.  A diet and exercise plan that is right for you. What should I know about screenings and tests to prevent falls? Screening and testing are the best ways to find a health problem early. Early diagnosis and treatment give you the best chance of managing medical conditions that are common after age 32. Certain conditions and lifestyle choices may make you more likely to have a fall. Your health care provider may recommend:  Regular vision checks. Poor vision and conditions such as cataracts can make you more likely to have a fall. If you wear glasses, make sure to get your prescription updated if your vision changes.  Medicine review. Work with your health care provider to regularly review all of the medicines you are taking, including over-the-counter medicines. Ask your health care provider about any side effects that may make you more likely to have a fall. Tell your health care provider if any medicines that you take make you feel dizzy or sleepy.  Osteoporosis screening. Osteoporosis is a condition that causes the bones to get weaker. This can make the bones weak and cause them to break more easily.  Blood pressure screening. Blood pressure changes and medicines to control blood pressure can make you feel dizzy.  Strength and balance checks. Your health care provider may recommend certain tests to check your  strength and balance while standing, walking, or changing positions.  Foot health exam. Foot pain and numbness, as well as not wearing proper footwear, can make you more likely to have a fall.  Depression screening. You may be more likely to have a fall if you have a fear of falling, feel emotionally low, or feel unable to do activities that you used to do.  Alcohol use screening. Using too much alcohol can affect your balance and may make you more likely to have a fall. What actions can I take to lower my risk of falls? General instructions  Talk with your health care provider about your risks for falling. Tell your health care provider if: ? You fall. Be sure to tell your health care provider about all falls, even ones that seem minor. ? You feel dizzy, sleepy, or off-balance.  Take over-the-counter and prescription medicines only as told by your health care provider. These include any supplements.  Eat a healthy diet and maintain a healthy weight. A healthy diet includes low-fat dairy products, low-fat (lean) meats, and fiber from whole grains, beans, and lots of fruits and vegetables. Home safety  Remove any tripping hazards, such as rugs, cords, and clutter.  Install safety equipment such as grab bars in bathrooms and safety rails on stairs.  Keep rooms and walkways well-lit. Activity   Follow a regular exercise program to stay fit. This will help you maintain your balance. Ask your health care provider what types of exercise are appropriate for you.  If you need a cane or  walker, use it as recommended by your health care provider.  Wear supportive shoes that have nonskid soles. Lifestyle  Do not drink alcohol if your health care provider tells you not to drink.  If you drink alcohol, limit how much you have: ? 0-1 drink a day for women. ? 0-2 drinks a day for men.  Be aware of how much alcohol is in your drink. In the [place] one drink equals one typical bottle of beer (12  oz), one-half glass of wine (5 oz), or one shot of hard liquor (1 oz).  Do not use any products that contain nicotine or tobacco, such as cigarettes and e-cigarettes. If you need help quitting, ask your health care provider. Summary  Having a healthy lifestyle and getting preventive care can help to protect your health and wellness after age 58.  Screening and testing are the best way to find a health problem early and help you avoid having a fall. Early diagnosis and treatment give you the best chance for managing medical conditions that are more common for people who are older than age 34.  Falls are a major cause of broken bones and head injuries in people who are older than age 52. Take precautions to prevent a fall at home.  Work with your health care provider to learn what changes you can make to improve your health and wellness and to prevent falls. This information is not intended to replace advice given to you by your health care provider. Make sure you discuss any questions you have with your health care provider. Document Revised: 01/18/2019 Document Reviewed: 08/10/2017 Elsevier Patient Education  2020 [place] 65 Years and Older, Female Preventive care refers to lifestyle choices and visits with your health care provider that can promote health and wellness. This includes:  A yearly physical exam. This is also called an annual well check.  Regular dental and eye exams.  Immunizations.  Screening for certain conditions.  Healthy lifestyle choices, such as diet and exercise. What can I expect for my preventive care visit? Physical exam Your health care provider will check:  Height and weight. These may be used to calculate body mass index (BMI), which is a measurement that tells if you are at a healthy weight.  Heart rate and blood pressure.  Your skin for abnormal spots. Counseling Your health care provider may ask you questions  about:  Alcohol, tobacco, and drug use.  Emotional well-being.  Home and relationship well-being.  Sexual activity.  Eating habits.  History of falls.  Memory and ability to understand (cognition).  Work and work Statistician.  Pregnancy and menstrual history. What immunizations do I need?  Influenza (flu) vaccine  This is recommended every year. Tetanus, diphtheria, and pertussis (Tdap) vaccine  You may need a Td booster every 10 years. Varicella (chickenpox) vaccine  You may need this vaccine if you have not already been vaccinated. Zoster (shingles) vaccine  You may need this after age 29. Pneumococcal conjugate (PCV13) vaccine  One dose is recommended after age 83. Pneumococcal polysaccharide (PPSV23) vaccine  One dose is recommended after age 9. Measles, mumps, and rubella (MMR) vaccine  You may need at least one dose of MMR if you were born in 1957 or later. You may also need a second dose. Meningococcal conjugate (MenACWY) vaccine  You may need this if you have certain conditions. Hepatitis A vaccine  You may need this if you have certain conditions or if you travel  or work in places where you may be exposed to hepatitis A. Hepatitis B vaccine  You may need this if you have certain conditions or if you travel or work in places where you may be exposed to hepatitis B. Haemophilus influenzae type b (Hib) vaccine  You may need this if you have certain conditions. You may receive vaccines as individual doses or as more than one vaccine together in one shot (combination vaccines). Talk with your health care provider about the risks and benefits of combination vaccines. What tests do I need? Blood tests  Lipid and cholesterol levels. These may be checked every 5 years, or more frequently depending on your overall health.  Hepatitis C test.  Hepatitis B test. Screening  Lung cancer screening. You may have this screening every year starting at age 72 if  you have a 30-pack-year history of smoking and currently smoke or have quit within the past 15 years.  Colorectal cancer screening. All adults should have this screening starting at age 86 and continuing until age 79. Your health care provider may recommend screening at age 77 if you are at increased risk. You will have tests every 1-10 years, depending on your results and the type of screening test.  Diabetes screening. This is done by checking your blood sugar (glucose) after you have not eaten for a while (fasting). You may have this done every 1-3 years.  Mammogram. This may be done every 1-2 years. Talk with your health care provider about how often you should have regular mammograms.  BRCA-related cancer screening. This may be done if you have a family history of breast, ovarian, tubal, or peritoneal cancers. Other tests  Sexually transmitted disease (STD) testing.  Bone density scan. This is done to screen for osteoporosis. You may have this done starting at age 63. Follow these instructions at home: Eating and drinking  Eat a diet that includes fresh fruits and vegetables, whole grains, lean protein, and low-fat dairy products. Limit your intake of foods with high amounts of sugar, saturated fats, and salt.  Take vitamin and mineral supplements as recommended by your health care provider.  Do not drink alcohol if your health care provider tells you not to drink.  If you drink alcohol: ? Limit how much you have to 0-1 drink a day. ? Be aware of how much alcohol is in your drink. In the [place] one drink equals one 12 oz bottle of beer (355 mL), one 5 oz glass of wine (148 mL), or one 1 oz glass of hard liquor (44 mL). Lifestyle  Take daily care of your teeth and gums.  Stay active. Exercise for at least 30 minutes on 5 or more days each week.  Do not use any products that contain nicotine or tobacco, such as cigarettes, e-cigarettes, and chewing tobacco. If you need help  quitting, ask your health care provider.  If you are sexually active, practice safe sex. Use a condom or other form of protection in order to prevent STIs (sexually transmitted infections).  Talk with your health care provider about taking a low-dose aspirin or statin. What's next?  Go to your health care provider once a year for a well check visit.  Ask your health care provider how often you should have your eyes and teeth checked.  Stay up to date on all vaccines. This information is not intended to replace advice given to you by your health care provider. Make sure you discuss any questions you have  with your health care provider. Document Revised: 09/21/2018 Document Reviewed: 09/21/2018 Elsevier Patient Education  2020 [employer].

## 2019-12-26 ENCOUNTER — Other Ambulatory Visit: Payer: Self-pay

## 2019-12-26 ENCOUNTER — Other Ambulatory Visit (INDEPENDENT_AMBULATORY_CARE_PROVIDER_SITE_OTHER): Payer: Medicare HMO

## 2019-12-26 DIAGNOSIS — I10 Essential (primary) hypertension: Secondary | ICD-10-CM

## 2019-12-26 DIAGNOSIS — E038 Other specified hypothyroidism: Secondary | ICD-10-CM

## 2019-12-26 DIAGNOSIS — E789 Disorder of lipoprotein metabolism, unspecified: Secondary | ICD-10-CM | POA: Diagnosis not present

## 2019-12-26 DIAGNOSIS — Z0000 Encounter for general adult medical examination without abnormal findings: Secondary | ICD-10-CM

## 2019-12-26 DIAGNOSIS — Z8639 Personal history of other endocrine, nutritional and metabolic disease: Secondary | ICD-10-CM

## 2019-12-26 LAB — COMPREHENSIVE METABOLIC PANEL
ALT: 9 U/L (ref 0–35)
AST: 17 U/L (ref 0–37)
Albumin: 3.9 g/dL (ref 3.5–5.2)
Alkaline Phosphatase: 67 U/L (ref 39–117)
BUN: 10 mg/dL (ref 6–23)
CO2: 32 mEq/L (ref 19–32)
Calcium: 9.3 mg/dL (ref 8.4–10.5)
Chloride: 105 mEq/L (ref 96–112)
Creatinine, Ser: 0.79 mg/dL (ref 0.40–1.20)
GFR: 70.99 mL/min (ref >60.00)
Glucose, Bld: 79 mg/dL (ref 70–99)
Potassium: 4.2 mEq/L (ref 3.5–5.1)
Sodium: 141 mEq/L (ref 135–145)
Total Bilirubin: 0.7 mg/dL (ref 0.2–1.2)
Total Protein: 6.7 g/dL (ref 6.0–8.3)

## 2019-12-26 LAB — CBC
HCT: 36.4 % (ref 36.0–46.0)
Hemoglobin: 12 g/dL (ref 12.0–15.0)
MCHC: 33.1 g/dL (ref 30.0–36.0)
MCV: 94.7 fl (ref 78.0–100.0)
Platelets: 233 10*3/uL (ref 150.0–400.0)
RBC: 3.84 Mil/uL — ABNORMAL LOW (ref 3.87–5.11)
RDW: 14 % (ref 11.5–15.5)
WBC: 5 10*3/uL (ref 4.0–10.5)

## 2019-12-26 LAB — URINALYSIS, ROUTINE W REFLEX MICROSCOPIC
Bilirubin Urine: NEGATIVE
Hgb urine dipstick: NEGATIVE
Ketones, ur: NEGATIVE
Leukocytes,Ua: NEGATIVE
Nitrite: NEGATIVE
RBC / HPF: NONE SEEN (ref 0–?)
Specific Gravity, Urine: 1.015 (ref 1.000–1.030)
Total Protein, Urine: NEGATIVE
Urine Glucose: NEGATIVE
Urobilinogen, UA: 0.2 (ref 0.0–1.0)
WBC, UA: NONE SEEN (ref 0–?)
pH: 7.5 (ref 5.0–8.0)

## 2019-12-26 LAB — LDL CHOLESTEROL, DIRECT: Direct LDL: 66 mg/dL

## 2019-12-26 LAB — LIPID PANEL
Cholesterol: 184 mg/dL (ref 0–200)
HDL: 89.2 mg/dL (ref >39.00)
LDL Cholesterol: 74 mg/dL (ref 0–99)
NonHDL: 95.07
Total CHOL/HDL Ratio: 2
Triglycerides: 104 mg/dL (ref 0.0–149.0)
VLDL: 20.8 mg/dL (ref 0.0–40.0)

## 2019-12-26 LAB — HEMOGLOBIN A1C: Hgb A1c MFr Bld: 5.7 % (ref 4.6–6.5)

## 2019-12-26 LAB — TSH: TSH: 5.12 u[IU]/mL — ABNORMAL HIGH (ref 0.35–4.50)

## 2019-12-27 MED ORDER — LEVOTHYROXINE SODIUM 50 MCG PO TABS: 50.0000 ug | ORAL_TABLET | Freq: Every day | ORAL | 0 refills | Status: DC

## 2019-12-27 NOTE — Addendum Note (Signed)
Addended by: Jon Billings on: 12/27/2019 12:53 PM   Modules accepted: Orders

## 2019-12-28 ENCOUNTER — Telehealth: Payer: Self-pay | Admitting: Family Medicine

## 2019-12-28 MED ORDER — TRIAMCINOLONE ACETONIDE 0.1 % EX OINT: TOPICAL_OINTMENT | CUTANEOUS | 2 refills | Status: AC

## 2019-12-28 NOTE — Telephone Encounter (Signed)
Spoke with patient.

## 2019-12-28 NOTE — Telephone Encounter (Signed)
Patient returning call about lab results. Please return patient call.

## 2019-12-28 NOTE — Addendum Note (Signed)
Addended by: Jon Billings on: 12/28/2019 10:48 AM   Modules accepted: Orders

## 2020-01-15 ENCOUNTER — Ambulatory Visit: Payer: Medicare HMO | Admitting: Podiatry

## 2020-01-15 ENCOUNTER — Other Ambulatory Visit: Payer: Self-pay

## 2020-01-15 DIAGNOSIS — M79675 Pain in left toe(s): Secondary | ICD-10-CM

## 2020-01-15 DIAGNOSIS — M79674 Pain in right toe(s): Secondary | ICD-10-CM | POA: Diagnosis not present

## 2020-01-15 DIAGNOSIS — B351 Tinea unguium: Secondary | ICD-10-CM | POA: Diagnosis not present

## 2020-01-15 MED ORDER — CICLOPIROX 8 % EX SOLN: Freq: Every day | CUTANEOUS | 0 refills | Status: DC

## 2020-01-18 DIAGNOSIS — G4733 Obstructive sleep apnea (adult) (pediatric): Secondary | ICD-10-CM | POA: Diagnosis not present

## 2020-01-25 ENCOUNTER — Telehealth: Payer: Self-pay | Admitting: Family Medicine

## 2020-01-25 DIAGNOSIS — I10 Essential (primary) hypertension: Secondary | ICD-10-CM

## 2020-01-25 MED ORDER — LISINOPRIL 20 MG PO TABS: 20.0000 mg | ORAL_TABLET | Freq: Every day | ORAL | 1 refills | Status: DC

## 2020-01-25 NOTE — Telephone Encounter (Signed)
Pt wants to get a refill on lisinopril (ZESTRIL) 20 MG tablet and have it sent to [place], [place], [place] [id number]

## 2020-01-26 MED ORDER — LISINOPRIL 20 MG PO TABS: 20.0000 mg | ORAL_TABLET | Freq: Every day | ORAL | 1 refills | Status: DC

## 2020-01-26 NOTE — Addendum Note (Signed)
Addended by: Jon Billings on: 01/26/2020 05:44 PM   Modules accepted: Orders

## 2020-01-26 NOTE — Telephone Encounter (Signed)
Done

## 2020-01-27 NOTE — Progress Notes (Signed)
Subjective:   Patient ID: [name], female   DOB: 74 y.o.   MRN: [id number]   HPI 74 year old female presents the office today for concerns of thick, discolored toenails that she cannot trim her self and she said yellow discoloration of the nails last year.  She denies any drainage or pus coming from the toenail sites and she has no other concerns today.  Review of Systems  All other systems reviewed and are negative.  Past Medical History:  Diagnosis Date  . Arthritis   . Asthma    mild  . H/O bronchitis   . Hx of blood clots 1967   from birth control in LUE  . Hypertension   . Hypoglycemia   . Hypothyroidism   . Migraine    from food  . Osteopenia 07/04/2019  . Pneumonia    hx of  . Seasonal allergies   . Sleep apnea   . UTI (urinary tract infection)     Past Surgical History:  Procedure Laterality Date  . CATARACT EXTRACTION     rt eye  . COLONOSCOPY    . detatched retina  2003   rt eye  . 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  acetaminophen (TYLENOL) 500 MG tablet, Take 500-1,000 mg by mouth as needed for mild pain or moderate pain. , Disp: , Rfl:  .  Calcium Carbonate-Vitamin D 600-400 MG-UNIT chew tablet, Chew 1 tablet by mouth 2 (two) times daily. (Patient not taking: Reported on 11/16/2019), Disp: , Rfl:  .  Cholecalciferol (VITAMIN D) 2000 UNITS CAPS, Take 2,000 Units by mouth 2 (two) times daily. , Disp: , Rfl:  .  ciclopirox (PENLAC) 8 % solution, Apply topically at bedtime. Apply over nail and surrounding skin. Apply daily over previous coat. After seven (7) days, may remove with alcohol and continue cycle., Disp: 6.6 mL, Rfl: 0 .  cyanocobalamin 1000 MCG tablet, Take 1,000 mcg by mouth daily., Disp: , Rfl:  .  fexofenadine (ALLEGRA) 60 MG tablet, Take 60 mg by mouth daily.,  Disp: , Rfl:  .  levothyroxine (SYNTHROID) 50 MCG tablet, Take 1 tablet (50 mcg total) by mouth daily., Disp: 90 tablet, Rfl: 0 .  lisinopril (ZESTRIL) 20 MG tablet, Take 1 tablet (20 mg total) by mouth at bedtime., Disp: 90 tablet, Rfl: 1 .  nystatin (MYCOSTATIN/NYSTOP) powder, Apply topically 2 (two) times daily as needed. Beneath both breasts, Disp: 15 g, Rfl: 0 .  triamcinolone ointment (KENALOG) 0.1 %, Apply once daily to hands as needed for hand eczema only., Disp: 30 g, Rfl: 2 .  Zoster Vaccine Adjuvanted [facility]) injection, 0.43ml IM now and again in 2-6 months, Disp: 0.5 mL, Rfl: 1  Allergies  Allergen Reactions  . Codeine Hives and Nausea And Vomiting  . Pork-Derived Products Other (See Comments)    Migraines w/ pork and other processed meats.  . Prednisone Other (See Comments)    Causes more pain          Objective:  Physical Exam  General: NAD  Dermatological: Nails are hypertrophic, dystrophic, brittle, discolored, elongated 10.  Yellow discoloration mostly to the hallux and second digit toenails.  No surrounding redness or drainage. Tenderness nails 1-5 bilaterally. No open lesions or pre-ulcerative lesions are identified today.   Vascular: Dorsalis Pedis artery and Posterior Tibial  artery pedal pulses are 2/4 bilateral with immedate capillary fill time. There is no pain with calf compression, swelling, warmth, erythema.   Neruologic: Grossly intact via light touch bilateral.   Musculoskeletal: No gross boney pedal deformities bilateral.       Assessment:   Symptomatic onychomycosis     Plan:  -Treatment options discussed including all alternatives, risks, and complications -Etiology of symptoms were discussed -Debrided the nails x10 without any complications or bleeding.  Discussed treatment options and she wants to proceed with topical treatment for nail fungus.  Prescribed Penlac and discussed side effects and duration of use.  Trula Slade  DPM

## 2020-01-28 NOTE — Telephone Encounter (Signed)
Spoke to pt told her Rx was sent to the pharmacy. Pt verbalized understanding.

## 2020-02-17 DIAGNOSIS — G4733 Obstructive sleep apnea (adult) (pediatric): Secondary | ICD-10-CM | POA: Diagnosis not present

## 2020-03-19 DIAGNOSIS — G4733 Obstructive sleep apnea (adult) (pediatric): Secondary | ICD-10-CM | POA: Diagnosis not present

## 2020-03-26 ENCOUNTER — Other Ambulatory Visit: Payer: Self-pay

## 2020-03-27 ENCOUNTER — Ambulatory Visit (INDEPENDENT_AMBULATORY_CARE_PROVIDER_SITE_OTHER): Payer: Medicare HMO | Admitting: Family Medicine

## 2020-03-27 ENCOUNTER — Encounter: Payer: Self-pay | Admitting: Family Medicine

## 2020-03-27 VITALS — BP 140/72 | HR 40 | Temp 96.6°F | Ht 64.0 in | Wt 193.4 lb

## 2020-03-27 DIAGNOSIS — E038 Other specified hypothyroidism: Secondary | ICD-10-CM

## 2020-03-27 DIAGNOSIS — E039 Hypothyroidism, unspecified: Secondary | ICD-10-CM

## 2020-03-27 DIAGNOSIS — J301 Allergic rhinitis due to pollen: Secondary | ICD-10-CM | POA: Diagnosis not present

## 2020-03-27 LAB — TSH: TSH: 2.09 u[IU]/mL (ref 0.35–4.50)

## 2020-03-27 MED ORDER — FLUTICASONE PROPIONATE 50 MCG/ACT NA SUSP: 2.0000 | Freq: Every day | NASAL | 6 refills | Status: DC

## 2020-03-27 NOTE — Progress Notes (Addendum)
Established Patient Office Visit  Subjective:  Patient ID: [name], female    DOB: 1945/11/26  Age: 74 y.o. MRN: [id number]  CC:  Chief Complaint  Patient presents with  . Follow-up    3 month follow up on medications, pt states that both ears feel like they are draining and sometimes she feels dizzy.     HPI [name] presents for follow-up of her hypothyroidism, osteopia and allergic rhinitis.  Patient is tolerating the higher dose of Synthroid.  She takes it in the morning on a fasting stomach.  She is aware that she should be sure not to allow the pharmacy to change generic Synthroid brand.  History of osteopenia with a T score of -1.7.  She is taking vitamin D but cannot tolerate calcium secondary to constipation.  She has been having postnasal drip with rhinorrhea and ear congestion.  She had 1 bout of dizziness with spinning.  Fortunately that has resolved.  Past Medical History:  Diagnosis Date  . Arthritis   . Asthma    mild  . H/O bronchitis   . Hx of blood clots 1967   from birth control in LUE  . Hypertension   . Hypoglycemia   . Hypothyroidism   . Migraine    from food  . Osteopenia 07/04/2019  . Pneumonia    hx of  . Seasonal allergies   . Sleep apnea   . UTI (urinary tract infection)     Past Surgical History:  Procedure Laterality Date  . CATARACT EXTRACTION     rt eye  . COLONOSCOPY    . detatched retina  2003   rt eye  . 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 Rheum arthritis Mother   . Thyroid disease Mother   . Rheum arthritis Father   . Thyroid disease Father   . Rheum arthritis Maternal Grandmother   . Rheum arthritis Maternal Grandfather   . Rheum arthritis Paternal Grandmother     Social History   Socioeconomic History  . Marital  status: Legally Separated    Spouse name: Not on file  . Number of children: Not on file  . Years of education: Not on file  . Highest education level: Not on file  Occupational History  . Not on file  Tobacco Use  . Smoking status: Former Smoker    Packs/day: 3.00    Years: 13.00    Pack years: 39.00    Types: Cigarettes    Quit date: 10/11/1974    Years since quitting: 45.4  . Smokeless tobacco: Never Used  Substance and Sexual Activity  . Alcohol use: No    Alcohol/week: 0.0 standard drinks  . Drug use: No  . Sexual activity: Never  Other Topics Concern  . Not on file  Social History Narrative  . Not on file   Social Determinants of Health   Financial Resource Strain: Low Risk   . Difficulty of Paying Living Expenses: Not very hard  Food Insecurity: No Food Insecurity  . Worried About Charity fundraiser in the Last Year: Never true  . Ran Out of Food in the Last Year: Never true  Transportation Needs: No Transportation Needs  . Lack of Transportation (Medical): No  . Lack of Transportation (Non-Medical): No  Physical Activity:   . Days of Exercise per Week:   . Minutes of Exercise per Session:   Stress:   . Feeling of Stress :   Social Connections:   . Frequency of Communication with Friends and Family:   . Frequency of Social Gatherings with Friends and Family:   . Attends Religious Services:   . Active Member of Clubs or Organizations:   . Attends Archivist Meetings:   Marland Kitchen Marital Status:   Intimate Partner Violence:   . Fear of Current or Ex-Partner:   . Emotionally Abused:   Marland Kitchen Physically Abused:   . Sexually Abused:     Outpatient Medications Prior to Visit  Medication Sig Dispense Refill  . acetaminophen (TYLENOL) 500 MG tablet Take 500-1,000 mg by mouth as needed for mild pain or moderate pain.     . Cholecalciferol (VITAMIN D) 2000 UNITS CAPS Take 2,000 Units by mouth 2 (two) times daily.     . ciclopirox (PENLAC) 8 % solution Apply  topically at bedtime. Apply over nail and surrounding skin. Apply daily over previous coat. After seven (7) days, may remove with alcohol and continue cycle. 6.6 mL 0  . cyanocobalamin 1000 MCG tablet Take 1,000 mcg by mouth daily.    . fexofenadine (ALLEGRA) 60 MG tablet Take 60 mg by mouth daily.    Marland Kitchen lisinopril (ZESTRIL) 20 MG tablet Take 1 tablet (20 mg total) by mouth at bedtime. 90 tablet 1  . nystatin (MYCOSTATIN/NYSTOP) powder Apply topically 2 (two) times daily as needed. Beneath both breasts 15 g 0  . triamcinolone ointment (KENALOG) 0.1 % Apply once daily to hands as needed for hand eczema only. 30 g 2  . levothyroxine (SYNTHROID) 50 MCG tablet Take 1 tablet (50 mcg total) by mouth daily. 90 tablet 0  . Calcium Carbonate-Vitamin D 600-400 MG-UNIT chew tablet Chew 1 tablet by mouth 2 (two) times daily. (Patient not taking: Reported on 11/16/2019)    . Zoster Vaccine Adjuvanted [facility]) injection 0.65ml IM now and again in 2-6 months 0.5 mL 1   No facility-administered medications prior to visit.    Allergies  Allergen Reactions  . Codeine Hives and Nausea And Vomiting  . Pork-Derived Products Other (See Comments)    Migraines w/ pork and other processed meats.  . Prednisone Other (See Comments)    Causes more pain    ROS Review of Systems  Constitutional: Negative.   HENT: Positive for congestion, hearing loss, postnasal drip and rhinorrhea. Negative for ear pain and sore throat.   Eyes: Negative for photophobia and visual disturbance.  Respiratory: Negative.   Cardiovascular: Negative.   Gastrointestinal: Negative.   Endocrine: Negative for cold intolerance and heat intolerance.  Genitourinary: Negative.   Musculoskeletal: Positive for gait problem.      Objective:    Physical Exam Vitals reviewed.  Constitutional:      General: She is not in acute distress.    Appearance: Normal appearance. She is not ill-appearing, toxic-appearing or diaphoretic.  HENT:      Head: Normocephalic and atraumatic.     Right Ear: Tympanic membrane, ear canal and external ear normal. There is no impacted cerumen.     Left Ear: Tympanic membrane, ear canal and external ear normal. There is no impacted cerumen.     Nose: Nose normal.     Mouth/Throat:     Mouth: Mucous membranes are moist.     Pharynx: Oropharynx is clear. No oropharyngeal exudate or posterior oropharyngeal  erythema.  Eyes:     General: No scleral icterus.       Right eye: No discharge.        Left eye: No discharge.     Extraocular Movements: Extraocular movements intact.     Conjunctiva/sclera: Conjunctivae normal.     Pupils: Pupils are equal, round, and reactive to light.  Cardiovascular:     Rate and Rhythm: Normal rate and regular rhythm.  Pulmonary:     Effort: Pulmonary effort is normal.     Breath sounds: Normal breath sounds.  Abdominal:     General: Bowel sounds are normal.  Musculoskeletal:     Cervical back: No rigidity or tenderness.     Right lower leg: No edema.     Left lower leg: No edema.  Lymphadenopathy:     Cervical: No cervical adenopathy.  Skin:    General: Skin is warm and dry.  Neurological:     General: No focal deficit present.     Mental Status: She is alert and oriented to person, place, and time.  Psychiatric:        Mood and Affect: Mood normal.        Behavior: Behavior normal.     BP 140/72   Pulse (!) 40   Temp (!) 96.6 F (35.9 C) (Tympanic)   Ht 5\' 4"  (1.626 m)   Wt 193 lb 6.4 oz (87.7 kg)   SpO2 96%   BMI 33.20 kg/m  Wt Readings from Last 3 Encounters:  03/27/20 193 lb 6.4 oz (87.7 kg)  12/25/19 195 lb (88.5 kg)  06/12/19 191 lb 3.2 oz (86.7 kg)     Health Maintenance Due  Topic Date Due  . TETANUS/TDAP  03/27/2018  . COLONOSCOPY  05/13/2018    There are no preventive care reminders to display for this patient.  Lab Results  Component Value Date   TSH 2.09 03/27/2020   Lab Results  Component Value Date   WBC 5.0 12/26/2019     HGB 12.0 12/26/2019   HCT 36.4 12/26/2019   MCV 94.7 12/26/2019   PLT 233.0 12/26/2019   Lab Results  Component Value Date   NA 141 12/26/2019   K 4.2 12/26/2019   CO2 32 12/26/2019   GLUCOSE 79 12/26/2019   BUN 10 12/26/2019   CREATININE 0.79 12/26/2019   BILITOT 0.7 12/26/2019   ALKPHOS 67 12/26/2019   AST 17 12/26/2019   ALT 9 12/26/2019   PROT 6.7 12/26/2019   ALBUMIN 3.9 12/26/2019   CALCIUM 9.3 12/26/2019   GFR 70.99 12/26/2019   Lab Results  Component Value Date   CHOL 184 12/26/2019   Lab Results  Component Value Date   HDL 89.20 12/26/2019   Lab Results  Component Value Date   LDLCALC 74 12/26/2019   Lab Results  Component Value Date   TRIG 104.0 12/26/2019   Lab Results  Component Value Date   CHOLHDL 2 12/26/2019   Lab Results  Component Value Date   HGBA1C 5.7 12/26/2019      Assessment & Plan:   Problem List Items Addressed This Visit      Endocrine   Hypothyroidism   Relevant Medications   levothyroxine (SYNTHROID) 50 MCG tablet   Other Relevant Orders   TSH (Completed)    Other Visit Diagnoses    Seasonal allergic rhinitis due to pollen    -  Primary   Relevant Medications   fluticasone (FLONASE) 50 MCG/ACT nasal spray  Meds ordered this encounter  Medications  . fluticasone (FLONASE) 50 MCG/ACT nasal spray    Sig: Place 2 sprays into both nostrils daily.    Dispense:  16 g    Refill:  6  . levothyroxine (SYNTHROID) 50 MCG tablet    Sig: Take 1 tablet (50 mcg total) by mouth daily.    Dispense:  90 tablet    Refill:  3    Follow-up: Return in about 6 months (around 09/26/2020), or if symptoms worsen or fail to improve.    Libby Maw, MD

## 2020-03-28 MED ORDER — LEVOTHYROXINE SODIUM 50 MCG PO TABS: 50.0000 ug | ORAL_TABLET | Freq: Every day | ORAL | 3 refills | Status: DC

## 2020-03-28 NOTE — Addendum Note (Signed)
Addended by: Jon Billings on: 03/28/2020 07:56 AM   Modules accepted: Orders

## 2020-03-31 ENCOUNTER — Encounter: Payer: Self-pay | Admitting: Family Medicine

## 2020-04-01 DIAGNOSIS — R69 Illness, unspecified: Secondary | ICD-10-CM | POA: Diagnosis not present

## 2020-04-18 DIAGNOSIS — G4733 Obstructive sleep apnea (adult) (pediatric): Secondary | ICD-10-CM | POA: Diagnosis not present

## 2020-04-22 ENCOUNTER — Other Ambulatory Visit: Payer: Self-pay

## 2020-04-22 ENCOUNTER — Encounter: Payer: Self-pay | Admitting: Podiatry

## 2020-04-22 ENCOUNTER — Ambulatory Visit: Payer: Medicare Other | Admitting: Podiatry

## 2020-04-22 DIAGNOSIS — M79675 Pain in left toe(s): Secondary | ICD-10-CM | POA: Diagnosis not present

## 2020-04-22 DIAGNOSIS — B351 Tinea unguium: Secondary | ICD-10-CM | POA: Insufficient documentation

## 2020-04-22 DIAGNOSIS — M79674 Pain in right toe(s): Secondary | ICD-10-CM

## 2020-04-22 NOTE — Progress Notes (Signed)
This patient returns to the office for evaluation and treatment of long thick painful nails .  This patient is unable to trim his own nails since the patient cannot reach his feet.  Patient says the nails are painful walking and wearing his shoes.  He returns for preventive foot care services.  General Appearance  Alert, conversant and in no acute stress.  Vascular  Dorsalis pedis and posterior tibial  pulses are palpable  bilaterally.  Capillary return is within normal limits  bilaterally. Temperature is within normal limits  bilaterally.  Neurologic  Senn-Weinstein monofilament wire test within normal limits  bilaterally. Muscle power within normal limits bilaterally.  Nails Thick disfigured discolored nails with subungual debris  from hallux to fifth toes bilaterally. No evidence of bacterial infection or drainage bilaterally.  Orthopedic  No limitations of motion  feet .  No crepitus or effusions noted.  No bony pathology or digital deformities noted.  HAV  B/L.    Skin  normotropic skin with no porokeratosis noted bilaterally.  No signs of infections or ulcers noted.     Onychomycosis  Pain in toes right foot  Pain in toes left foot  Debridement  of nails  1-5  B/L with a nail nipper.  Nails were then filed using a dremel tool with no incidents. RTC 3 months.   Raima Geathers DPM   

## 2020-06-03 ENCOUNTER — Encounter: Payer: Self-pay | Admitting: Pulmonary Disease

## 2020-06-03 ENCOUNTER — Other Ambulatory Visit: Payer: Self-pay

## 2020-06-03 ENCOUNTER — Ambulatory Visit: Payer: Medicare HMO | Admitting: Pulmonary Disease

## 2020-06-03 DIAGNOSIS — G4733 Obstructive sleep apnea (adult) (pediatric): Secondary | ICD-10-CM

## 2020-06-03 DIAGNOSIS — Z9989 Dependence on other enabling machines and devices: Secondary | ICD-10-CM | POA: Diagnosis not present

## 2020-06-03 DIAGNOSIS — J4520 Mild intermittent asthma, uncomplicated: Secondary | ICD-10-CM

## 2020-06-03 NOTE — Assessment & Plan Note (Signed)
She had residual events 8/hour on CPAP of 11 cm. On CPAP of 12 cm now, she still has residuals 8.5/hour but I note that centrals have increased to 4.9/hour , so not sure that increasing pressure will help anymore.  She seems to be relatively asymptomatic and feels rested on most mornings, so I will not make any changes at this time and just monitor her for symptoms and obtain another download on her next visit Doubt that ASV is necessary at this time but there is concern for treatment emergent central apneas She is very compliant and CPAP is certainly helped improve her daytime somnolence and fatigue Weight loss encouraged, compliance with goal of at least 4-6 hrs every night is the expectation. Advised against medications with sedative side effects Cautioned against driving when sleepy - understanding that sleepiness will vary on a day to day basis

## 2020-06-03 NOTE — Assessment & Plan Note (Signed)
Seems to be asthmatic bronchitis, more related to sinus infections and allergies rather than true asthma.  No maintenance medication required.  She has not required albuterol for the past 3 months

## 2020-06-03 NOTE — Progress Notes (Signed)
   Subjective:    Patient ID: [name], female    DOB: 1945-11-05, 74 y.o.   MRN: [id number]  HPI  74 yo Remote smoker, for FU of obstructive sleep apnea   PMH significant for HTN, allergic rhinitis, asthmatic bronchitis  Overall CPAP has helped improve her daytime somnolence and fatigue.  No problems with mask or pressure.  She would like to decrease the temperature setting on her CPAP machine. Reviewed last office visit 04/2019, pressure was 11 cm, residual AHI was 8/hour. Pressure was increased to 12 cm. Download was reviewed which shows residual AHI of 8.5/hour, excellent compliance more than 7 hours per night, minimal leak   Significant tests/ events reviewed  PSG 9/ 2012>>TST 286 minutes with decreased slow wave sleep and very little REM noted. Sleep onset latency was normal at 6.5 minutes and REM onset was very prolonged at 345 minutes. Sleep efficiency was poor at 70%. 50 apneas and 27 obstructive hypopneas, AHI of 16 events per hour. The events occurred in all body positions and there was moderate snoring noted throughout - low desatn of 88%  Review of Systems Patient denies significant dyspnea,cough, hemoptysis,  chest pain, palpitations, pedal edema, orthopnea, paroxysmal nocturnal dyspnea, lightheadedness, nausea, vomiting, abdominal or  leg pains      Objective:   Physical Exam  Gen. Pleasant, obese, in no distress ENT - no lesions, no post nasal drip Neck: No JVD, no thyromegaly, no carotid bruits Lungs: no use of accessory muscles, no dullness to percussion, decreased without rales or rhonchi  Cardiovascular: Rhythm regular, heart sounds  normal, no murmurs or gallops, no peripheral edema Musculoskeletal: No deformities, no cyanosis or clubbing , no tremors        Assessment & Plan:

## 2020-06-03 NOTE — Patient Instructions (Signed)
CPAP is set at 12 cm. CPAP supplies will be renewed for a year

## 2020-06-09 DIAGNOSIS — G4733 Obstructive sleep apnea (adult) (pediatric): Secondary | ICD-10-CM | POA: Diagnosis not present

## 2020-07-02 DIAGNOSIS — Z1231 Encounter for screening mammogram for malignant neoplasm of breast: Secondary | ICD-10-CM | POA: Diagnosis not present

## 2020-07-02 LAB — HM MAMMOGRAPHY

## 2020-07-10 DIAGNOSIS — G4733 Obstructive sleep apnea (adult) (pediatric): Secondary | ICD-10-CM | POA: Diagnosis not present

## 2020-07-15 ENCOUNTER — Ambulatory Visit: Payer: Medicare HMO

## 2020-07-21 ENCOUNTER — Other Ambulatory Visit: Payer: Self-pay

## 2020-07-22 ENCOUNTER — Ambulatory Visit (INDEPENDENT_AMBULATORY_CARE_PROVIDER_SITE_OTHER): Payer: Medicare HMO

## 2020-07-22 DIAGNOSIS — Z23 Encounter for immunization: Secondary | ICD-10-CM | POA: Diagnosis not present

## 2020-07-22 NOTE — Progress Notes (Signed)
Per orders of Dr Kremer, injection of Influenza given by Jevonte Clanton, cma.  Patient tolerated injection well.   

## 2020-07-30 ENCOUNTER — Encounter: Payer: Self-pay | Admitting: Podiatry

## 2020-07-30 ENCOUNTER — Ambulatory Visit: Payer: Medicare HMO | Admitting: Podiatry

## 2020-07-30 ENCOUNTER — Other Ambulatory Visit: Payer: Self-pay

## 2020-07-30 DIAGNOSIS — M79675 Pain in left toe(s): Secondary | ICD-10-CM | POA: Diagnosis not present

## 2020-07-30 DIAGNOSIS — M79674 Pain in right toe(s): Secondary | ICD-10-CM | POA: Diagnosis not present

## 2020-07-30 DIAGNOSIS — B351 Tinea unguium: Secondary | ICD-10-CM

## 2020-07-30 NOTE — Progress Notes (Signed)
This patient returns to the office for evaluation and treatment of long thick painful nails .  This patient is unable to trim his own nails since the patient cannot reach his feet.  Patient says the nails are painful walking and wearing his shoes.  He returns for preventive foot care services.  General Appearance  Alert, conversant and in no acute stress.  Vascular  Dorsalis pedis and posterior tibial  pulses are palpable  bilaterally.  Capillary return is within normal limits  bilaterally. Temperature is within normal limits  bilaterally.  Neurologic  Senn-Weinstein monofilament wire test within normal limits  bilaterally. Muscle power within normal limits bilaterally.  Nails Thick disfigured discolored nails with subungual debris  from hallux to fifth toes bilaterally. No evidence of bacterial infection or drainage bilaterally.  Orthopedic  No limitations of motion  feet .  No crepitus or effusions noted.  No bony pathology or digital deformities noted.  HAV  B/L.    Skin  normotropic skin with no porokeratosis noted bilaterally.  No signs of infections or ulcers noted.     Onychomycosis  Pain in toes right foot  Pain in toes left foot  Debridement  of nails  1-5  B/L with a nail nipper.  Nails were then filed using a dremel tool with no incidents. RTC 3 months.   Shelbey Spindler DPM   

## 2020-08-09 ENCOUNTER — Ambulatory Visit: Payer: Medicare HMO | Attending: Internal Medicine

## 2020-08-09 DIAGNOSIS — Z23 Encounter for immunization: Secondary | ICD-10-CM

## 2020-08-09 DIAGNOSIS — G4733 Obstructive sleep apnea (adult) (pediatric): Secondary | ICD-10-CM | POA: Diagnosis not present

## 2020-08-09 NOTE — Progress Notes (Signed)
   Covid-19 Vaccination Clinic  Name:  [name]    MRN: [id number] DOB: 1946/09/24  08/09/2020  [name] was observed post Covid-19 immunization for 15 minutes without incident. She was provided with Vaccine Information Sheet and instruction to access the V-Safe system.   [name] was instructed to call 911 with any severe reactions post vaccine: Marland Kitchen Difficulty breathing  . Swelling of face and throat  . A fast heartbeat  . A bad rash all over body  . Dizziness and weakness

## 2020-08-19 ENCOUNTER — Encounter: Payer: Self-pay | Admitting: Family Medicine

## 2020-09-26 ENCOUNTER — Ambulatory Visit: Payer: Medicare HMO | Admitting: Family Medicine

## 2020-10-09 ENCOUNTER — Ambulatory Visit: Payer: Medicare HMO | Admitting: Family Medicine

## 2020-10-18 DIAGNOSIS — Z1152 Encounter for screening for COVID-19: Secondary | ICD-10-CM | POA: Diagnosis not present

## 2020-10-23 ENCOUNTER — Telehealth: Payer: Self-pay | Admitting: Family Medicine

## 2020-10-23 NOTE — Telephone Encounter (Signed)
Left message for patient to schedule Annual Wellness Visit.  Please schedule with Nurse Health Advisor Martha Stanley, RN at [facility] [facility]  °

## 2020-10-30 ENCOUNTER — Telehealth (INDEPENDENT_AMBULATORY_CARE_PROVIDER_SITE_OTHER): Payer: Medicare HMO | Admitting: Family Medicine

## 2020-10-30 DIAGNOSIS — R059 Cough, unspecified: Secondary | ICD-10-CM | POA: Diagnosis not present

## 2020-10-30 DIAGNOSIS — R062 Wheezing: Secondary | ICD-10-CM | POA: Diagnosis not present

## 2020-10-30 MED ORDER — DOXYCYCLINE HYCLATE 100 MG PO TABS: 100.0000 mg | ORAL_TABLET | Freq: Two times a day (BID) | ORAL | 0 refills | Status: DC

## 2020-10-30 MED ORDER — BENZONATATE 100 MG PO CAPS: 100.0000 mg | ORAL_CAPSULE | Freq: Three times a day (TID) | ORAL | 0 refills | Status: DC | PRN

## 2020-10-30 MED ORDER — ALBUTEROL SULFATE HFA 108 (90 BASE) MCG/ACT IN AERS: 2.0000 | INHALATION_SPRAY | Freq: Four times a day (QID) | RESPIRATORY_TRACT | 0 refills | Status: DC | PRN

## 2020-10-30 NOTE — Patient Instructions (Addendum)
  HOME CARE TIPS:  -[place] testing information: [email or web address].[facility].org/ OR [phone] Most pharmacies also offer testing and home test kits. Schedule follow-up visit promptly if you have a positive COVID test.  -I sent the medication(s) we discussed to your pharmacy: Meds ordered this encounter  Medications  . benzonatate (TESSALON PERLES) 100 MG capsule    Sig: Take 1 capsule (100 mg total) by mouth 3 (three) times daily as needed.    Dispense:  20 capsule    Refill:  0  . albuterol (PROAIR HFA) 108 (90 Base) MCG/ACT inhaler    Sig: Inhale 2 puffs into the lungs every 6 (six) hours as needed for wheezing or shortness of breath.    Dispense:  1 each    Refill:  0  . doxycycline (VIBRA-TABS) 100 MG tablet    Sig: Take 1 tablet (100 mg total) by mouth 2 (two) times daily.    Dispense:  14 tablet    Refill:  0     -can use tylenol if needed for fevers, aches and pains per instructions  -can use nasal saline a few times per day if you have nasal congestion  -stay hydrated, drink plenty of fluids and eat small healthy meals - avoid dairy  -can take 1000 IU (63mcg) Vit D3 and 100-500 mg of Vit C daily per instructions  -If the Covid test is positive, check out the CDC website for more information on home care, transmission and treatment for COVID19  -follow up with your doctor in 2-3 days unless improving and feeling better  -stay home while sick, except to seek medical care, and if you have COVID19 ideally it would be best to stay home for a full 10 days since the onset of symptoms PLUS one day of no fever and feeling better. Wear a good mask (such as N95 or KN95) if around others to reduce the risk of transmission.  It was nice to meet you today, and I really hope you are feeling better soon. I help [facility] out with telemedicine visits on Tuesdays and Thursdays and am available for visits on those days. If you have any concerns or  questions following this visit please schedule a follow up visit with your Primary Care doctor or seek care at a local urgent care clinic to avoid delays in care.    Seek in person care or schedule a follow up video visit promptly if your symptoms worsen, new concerns arise or you are not improving with treatment. Call 911 and/or seek emergency care if your symptoms are severe or life threatening.

## 2020-10-30 NOTE — Progress Notes (Signed)
Virtual Visit via Telephone Note  I connected with [name] on 10/30/20 at  1:00 PM EST by telephone and verified that I am speaking with the correct person using two identifiers.   I discussed the limitations, risks, security and privacy concerns of performing an evaluation and management service by telephone and the availability of in person appointments. I also discussed with the patient that there may be a patient responsible charge related to this service. The patient expressed understanding and agreed to proceed.  Location patient: home, [place] Location provider: work or home office Participants present for the call: patient, provider Patient did not have a visit with me in the prior 7 days to address this/these issue(s).   History of Present Illness:  Acute telemedicine visit for sinus congestion: -Onset: 10/26/20 -Symptoms include: nasal congestion, pnd, laryngitis yesterday, cough -productive of yellow tinged to the mucus, subjective mild fevers, mild diarrhea, some wheezing at times -Denies: fevers, CP, SOB currently, vomiting, inability to eat/drink/get out of bed -oldest daughter and son who live with her were sick with vomiting and other symptoms recently -Has tried:staying hydrated, tylenol, musinex -Pertinent past medical history: HTN, asthma - reports is our of her inhaler, obesity -Pertinent medication allergies: prednisone, codeine -COVID-19 vaccine status: fully vaccinated + booster; and had flu shot   Observations/Objective: Patient sounds cheerful and well on the phone. I do not appreciate any SOB. Speech and thought processing are grossly intact. Patient reported vitals:  Assessment and Plan:  Cough  Wheeze  -we discussed possible serious and likely etiologies, options for evaluation and workup, limitations of telemedicine visit vs in person visit, treatment, treatment risks and precautions. Pt prefers to treat via telemedicine empirically rather than in  person at this moment. Query VURI, bronchitis vs asthma exacerbation, covid19, influenza vs other. Discussed testing options, potential complications, isolation and precautions. She opted for testing for COVID-19 and plans to schedule this (discussed numerous options), Tessalon for cough, a refill on her albuterol in case she needs it and because of the thick sputum production a prescription for doxycycline in case worsening symptoms or not turning the corner over the next few days.  Advised to follow-up with her primary care doctor if she gets a positive COVID test.  Scheduled follow up with PCP offered: Agrees to schedule follow-up if needed Advised to seek prompt follow-up video visit or in person care if worsening, new symptoms arise, or if is not improving with treatment. Advised of options for inperson care in case PCP office not available. Did let the patient know that I only do telemedicine shifts for [place] on Tuesdays and Thursdays and advised a follow up visit with PCP or at an [facility] if has further questions or concerns.   Follow Up Instructions:  I did not refer this patient for an OV with me in the next 24 hours for this/these issue(s).  I discussed the assessment and treatment plan with the patient. The patient was provided an opportunity to ask questions and all were answered. The patient agreed with the plan and demonstrated an understanding of the instructions.   I spent 22 minutes on the date of this visit in the care of this patient. See summary of tasks completed to properly care for this patient in the detailed notes above which often include previsit review of recent office visit notes, review of PMH, medications, allergies, evaluation of the patient and ordering and instructing patient on testing and care options.     Nickola Major  Maudie Mercury, DO

## 2020-10-31 ENCOUNTER — Other Ambulatory Visit: Payer: Medicare HMO

## 2020-11-04 ENCOUNTER — Other Ambulatory Visit: Payer: Self-pay

## 2020-11-04 ENCOUNTER — Encounter: Payer: Self-pay | Admitting: Podiatry

## 2020-11-04 ENCOUNTER — Ambulatory Visit (INDEPENDENT_AMBULATORY_CARE_PROVIDER_SITE_OTHER): Payer: Medicare HMO | Admitting: Podiatry

## 2020-11-04 DIAGNOSIS — M79674 Pain in right toe(s): Secondary | ICD-10-CM

## 2020-11-04 DIAGNOSIS — M79675 Pain in left toe(s): Secondary | ICD-10-CM

## 2020-11-04 DIAGNOSIS — B351 Tinea unguium: Secondary | ICD-10-CM | POA: Diagnosis not present

## 2020-11-04 NOTE — Progress Notes (Signed)
This patient returns to the office for evaluation and treatment of long thick painful nails .  This patient is unable to trim her own nails since the patient cannot reach her feet.  Patient says the nails are painful walking and wearing his shoes.  She returns for preventive foot care services.  General Appearance  Alert, conversant and in no acute stress.  Vascular  Dorsalis pedis and posterior tibial  pulses are palpable  bilaterally.  Capillary return is within normal limits  bilaterally. Temperature is within normal limits  bilaterally.  Neurologic  Senn-Weinstein monofilament wire test within normal limits  bilaterally. Muscle power within normal limits bilaterally.  Nails Thick disfigured discolored nails with subungual debris  from hallux to fifth toes bilaterally. No evidence of bacterial infection or drainage bilaterally.  Orthopedic  No limitations of motion  feet .  No crepitus or effusions noted.  No bony pathology or digital deformities noted.  HAV  B/L.  Skin  normotropic skin with no porokeratosis noted bilaterally.  No signs of infections or ulcers noted.   Asymptomatic callus sub 5th right foot.  Onychomycosis  Pain in toes right foot  Pain in toes left foot  Debridement  of nails  1-5  B/L with a nail nipper.  Nails were then filed using a dremel tool with no incidents.    RTC 3 months    Gardiner Barefoot DPM

## 2020-11-06 ENCOUNTER — Ambulatory Visit: Payer: Medicare HMO | Admitting: Family Medicine

## 2020-11-10 ENCOUNTER — Other Ambulatory Visit: Payer: Self-pay | Admitting: Family Medicine

## 2020-11-10 NOTE — Telephone Encounter (Signed)
benzonatate (TESSALON PERLES) 100 MG capsule    [address] [place], [place] [address] Phone:  [phone]  Fax:  [phone]

## 2020-11-11 ENCOUNTER — Ambulatory Visit (INDEPENDENT_AMBULATORY_CARE_PROVIDER_SITE_OTHER): Payer: Medicare HMO

## 2020-11-11 ENCOUNTER — Ambulatory Visit: Payer: Medicare HMO

## 2020-11-11 ENCOUNTER — Other Ambulatory Visit: Payer: Self-pay

## 2020-11-11 ENCOUNTER — Ambulatory Visit
Admission: EM | Admit: 2020-11-11 | Discharge: 2020-11-11 | Disposition: A | Discharge status: Home / Self Care | Payer: Medicare HMO | Attending: Emergency Medicine | Admitting: Emergency Medicine

## 2020-11-11 DIAGNOSIS — R059 Cough, unspecified: Secondary | ICD-10-CM | POA: Diagnosis not present

## 2020-11-11 DIAGNOSIS — R0602 Shortness of breath: Secondary | ICD-10-CM

## 2020-11-11 DIAGNOSIS — J22 Unspecified acute lower respiratory infection: Secondary | ICD-10-CM | POA: Diagnosis not present

## 2020-11-11 MED ORDER — METHYLPREDNISOLONE 4 MG PO TBPK: ORAL_TABLET | ORAL | 0 refills | Status: DC

## 2020-11-11 MED ORDER — BENZONATATE 200 MG PO CAPS: 200.0000 mg | ORAL_CAPSULE | Freq: Three times a day (TID) | ORAL | 0 refills | Status: AC | PRN

## 2020-11-11 NOTE — Telephone Encounter (Signed)
[name],   Please let her know that doctor Audry Pecina only does shifts for [facility] a few days per week, so just now got this message from her PCP office.Apologize for delay.  Since it has been quite some time since her visit with me, would advise follow up visit if still not feeling well. Thank you.

## 2020-11-11 NOTE — ED Provider Notes (Signed)
[facility]    CSN: [id number] Arrival date & time: 11/11/20  1417      History   Chief Complaint Chief Complaint  Patient presents with  . Cough    HPI [name] is a 75 y.o. female history of asthma, bronchitis, prior DVT, hypertension, presenting today for evaluation of bronchitis.  Was treated on 10/30/2020 for bronchitis with Tessalon, doxycycline and albuterol. Cough x 2 weeks or more. Tessalon was helping. Denies fevers recently. Reports wheezing and shortness of breath.   HPI  Past Medical History:  Diagnosis Date  . Arthritis   . Asthma    mild  . H/O bronchitis   . Hx of blood clots 1967   from birth control in LUE  . Hypertension   . Hypoglycemia   . Hypothyroidism   . Migraine    from food  . Osteopenia 07/04/2019  . Pneumonia    hx of  . Seasonal allergies   . Sleep apnea   . UTI (urinary tract infection)     Patient Active Problem List   Diagnosis Date Noted  . Pain due to onychomycosis of toenails of both feet 04/22/2020  . History of hypoglycemia 12/25/2019  . Onychomycosis 12/25/2019  . Abnormal serum cholesterol 12/25/2019  . Osteopenia 07/04/2019  . Obesity (BMI 30-39.9) 12/22/2017  . Vitamin D deficiency 05/18/2016  . Constipation 09/25/2013  . Acute blood loss anemia 09/20/2013  . Osteoarthritis of right hip 09/19/2013  . 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 Heel pain 02/16/2013  . Knee pain 07/05/2012  . Bilateral bunions 07/05/2012  . Screening for malignant neoplasm of the cervix 07/05/2012  . Healthcare maintenance 07/05/2012  . Sinusitis 02/18/2012  . Anxiety and depression 11/02/2011  . OSA on CPAP 11/02/2011  . Asthma 08/12/2010  . PANIC DISORDER 05/07/2009  . Essential hypertension 12/05/2008  . Hypothyroidism 03/27/2008  . ARTHRITIS 03/27/2008  . Allergic rhinitis, cause unspecified 08/26/2007    Past Surgical History:  Procedure Laterality Date  . CATARACT EXTRACTION     rt eye  .  COLONOSCOPY    . detatched retina  2003   rt eye  . 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   Procedure: RIGHT TOTAL HIP ARTHROPLASTY;  Surgeon: Tobi Bastos, MD;  Location: [facility];  Service: Orthopedics;  Laterality: Right;    OB History   No obstetric history on file.      Home Medications    Prior to Admission medications   Medication Sig Start Date End Date Taking? Authorizing Provider  benzonatate (TESSALON) 200 MG capsule Take 1 capsule (200 mg total) by mouth 3 (three) times daily as needed for up to 7 days for cough. 11/11/20 11/18/20 Yes Josearmando Kuhnert C, PA-C  methylPREDNISolone (MEDROL DOSEPAK) 4 MG TBPK tablet Take as directed 11/11/20  Yes [place] Nolton C, PA-C  acetaminophen (TYLENOL) 500 MG tablet Take 500-1,000 mg by mouth as needed for mild pain or moderate pain.     [provider]  albuterol (PROAIR HFA) 108 (90 Base) MCG/ACT inhaler Inhale 2 puffs into the lungs every 6 (six) hours as needed for wheezing or shortness of breath. 10/30/20   Lucretia Kern, DO  Cholecalciferol (VITAMIN D) 2000 UNITS CAPS Take 2,000 Units by mouth 2 (two) times daily.     [provider]  ciclopirox (PENLAC) 8 % solution Apply topically at bedtime. Apply over nail and surrounding skin. Apply daily over previous coat. After seven (7) days, may remove  with alcohol and continue cycle. 01/15/20   Trula Slade, DPM  cyanocobalamin 1000 MCG tablet Take 1,000 mcg by mouth daily.    [provider]  fexofenadine (ALLEGRA) 60 MG tablet Take 60 mg by mouth daily.    [provider]  fluticasone (FLONASE) 50 MCG/ACT nasal spray Place 2 sprays into both nostrils daily. 03/27/20   Libby Maw, MD  levothyroxine (SYNTHROID) 50 MCG tablet Take 1 tablet (50 mcg total) by mouth daily. 03/28/20   Libby Maw, MD  lisinopril (ZESTRIL) 20 MG tablet Take 1 tablet (20 mg total) by mouth at bedtime. 01/26/20   Libby Maw, MD  nystatin  (MYCOSTATIN/NYSTOP) powder Apply topically 2 (two) times daily as needed. Beneath both breasts 12/06/18   Debbrah Alar, NP  triamcinolone ointment (KENALOG) 0.1 % Apply once daily to hands as needed for hand eczema only. 12/28/19   Libby Maw, MD  Zoster Vaccine Adjuvanted [facility]) injection 0.43ml IM now and again in 2-6 months 12/06/18   Debbrah Alar, NP    Family History Family History  Problem Relation Age of Onset  . Rheum arthritis Mother   . Thyroid disease Mother   . Rheum arthritis Father   . Thyroid disease Father   . Rheum arthritis Maternal Grandmother   . Rheum arthritis Maternal Grandfather   . Rheum arthritis Paternal Grandmother     Social History Social History   Tobacco Use  . Smoking status: Former Smoker    Packs/day: 3.00    Years: 13.00    Pack years: 39.00    Types: Cigarettes    Quit date: 10/11/1974    Years since quitting: 46.1  . Smokeless tobacco: Never Used  Substance Use Topics  . Alcohol use: No    Alcohol/week: 0.0 standard drinks  . Drug use: No     Allergies   Codeine, Pork-derived products, and Prednisone   Review of Systems Review of Systems  Constitutional: Negative for activity change, appetite change, chills, fatigue and fever.  HENT: Positive for congestion. Negative for ear pain, rhinorrhea, sinus pressure, sore throat and trouble swallowing.   Eyes: Negative for discharge and redness.  Respiratory: Positive for cough and shortness of breath. Negative for chest tightness.   Cardiovascular: Negative for chest pain.  Gastrointestinal: Negative for abdominal pain, diarrhea, nausea and vomiting.  Musculoskeletal: Negative for myalgias.  Skin: Negative for rash.  Neurological: Negative for dizziness, light-headedness and headaches.     Physical Exam Triage Vital Signs ED Triage Vitals  Enc Vitals Group     BP      Pulse      Resp      Temp      Temp src      SpO2      Weight      Height       Head Circumference      Peak Flow      Pain Score      Pain Loc      Pain Edu?      Excl. in [place]?    No data found.  Updated Vital Signs BP (!) 197/75 (BP Location: Left Arm)   Pulse (!) 58   Temp 97.8 F (36.6 C) (Oral)   Resp 18   SpO2 99%   Visual Acuity Right Eye Distance:   Left Eye Distance:   Bilateral Distance:    Right Eye Near:   Left Eye Near:    Bilateral Near:  Physical Exam Vitals and nursing note reviewed.  Constitutional:      Appearance: She is well-developed and well-nourished.     Comments: No acute distress  HENT:     Head: Normocephalic and atraumatic.     Ears:     Comments: Bilateral ears without tenderness to palpation of external auricle, tragus and mastoid, EAC's without erythema or swelling, TM's with good bony landmarks and cone of light. Non erythematous.     Nose: Nose normal.     Mouth/Throat:     Comments: Oral mucosa pink and moist, no tonsillar enlargement or exudate. Posterior pharynx patent and nonerythematous, no uvula deviation or swelling. Normal phonation. Eyes:     Conjunctiva/sclera: Conjunctivae normal.  Cardiovascular:     Rate and Rhythm: Normal rate.  Pulmonary:     Effort: Pulmonary effort is normal. No respiratory distress.     Comments: Breathing comfortably at rest, CTABL, no wheezing, rales or other adventitious sounds auscultated Abdominal:     General: There is no distension.  Musculoskeletal:        General: Normal range of motion.     Cervical back: Neck supple.  Skin:    General: Skin is warm and dry.  Neurological:     Mental Status: She is alert and oriented to person, place, and time.  Psychiatric:        Mood and Affect: Mood and affect normal.      UC Treatments / Results  Labs (all labs ordered are listed, but only abnormal results are displayed) Labs Reviewed - No data to display  EKG   Radiology DG Chest 2 View  Result Date: 11/11/2020 CLINICAL DATA:  Cough, shortness of breath  EXAM: CHEST - 2 VIEW COMPARISON:  2014 FINDINGS: The heart size and mediastinal contours are within normal limits. Both lungs are clear. No pleural effusion. Small focus of sclerosis overlying the right scapula was not present on the prior study. IMPRESSION: 1. No acute abnormality of the lungs. 2. Nonspecific new small focus of sclerosis overlying the right scapula may reflect an indeterminate bone lesion. Electronically Signed   By: Macy Mis M.D.   On: 11/11/2020 16:38    Procedures Procedures (including critical care time)  Medications Ordered in UC Medications - No data to display  Initial Impression / Assessment and Plan / UC Course  I have reviewed the triage vital signs and the nursing notes.  Pertinent labs & imaging results that were available during my care of the patient were reviewed by me and considered in my medical decision making (see chart for details).     1.  Cough-x-ray negative for acute pulmonary abnormality, no signs of pneumonia or fluid, will proceed with treatment for bronchitis, providing Medrol Dosepak, refilling Tessalon, deferring further antibiotics as you are his recently completed course of doxycycline.  Discussed incidental finding of sclerosis/bone lesion of right scapula on x-ray, recommended to follow-up with primary care for monitoring.  Discussed strict return precautions. Patient verbalized understanding and is agreeable with plan.  Final Clinical Impressions(s) / UC Diagnoses   Final diagnoses:  Lower respiratory infection (e.g., bronchitis, pneumonia, pneumonitis, pulmonitis)     Discharge Instructions     X-ray normal without any signs of pneumonia Begin Medrol Dosepak over the next 6 days Tessalon every 8 hours for cough Continue to monitor symptoms Follow-up if not improving or worsening    ED Prescriptions    Medication Sig Dispense Auth. Provider   benzonatate (TESSALON) 200 MG capsule  Take 1 capsule (200 mg total) by mouth  3 (three) times daily as needed for up to 7 days for cough. 28 capsule Algis Lehenbauer C, PA-C   methylPREDNISolone (MEDROL DOSEPAK) 4 MG TBPK tablet Take as directed 21 each Ellard Nan C, PA-C     PDMP not reviewed this encounter.   Janith Lima, [place] 11/11/20 [phone]

## 2020-11-11 NOTE — ED Triage Notes (Signed)
Pt c/o cough for over 2wks. States was tx'd for bronchitis on 01/20. States still has the cough and upper chest congestion.

## 2020-11-11 NOTE — Discharge Instructions (Signed)
X-ray normal without any signs of pneumonia Begin Medrol Dosepak over the next 6 days Tessalon every 8 hours for cough Continue to monitor symptoms Follow-up if not improving or worsening

## 2020-11-12 NOTE — Telephone Encounter (Signed)
Patient informed of the message below, stated she was grateful for the follow up phone call.  Patient stated she was evaluated at the urgent care at [facility] yesterday and had a chest x-ray.

## 2020-12-09 ENCOUNTER — Ambulatory Visit (INDEPENDENT_AMBULATORY_CARE_PROVIDER_SITE_OTHER): Payer: Medicare HMO

## 2020-12-09 ENCOUNTER — Encounter: Payer: Self-pay | Admitting: Family Medicine

## 2020-12-09 ENCOUNTER — Ambulatory Visit (INDEPENDENT_AMBULATORY_CARE_PROVIDER_SITE_OTHER): Payer: Medicare HMO | Admitting: Family Medicine

## 2020-12-09 ENCOUNTER — Encounter: Payer: Medicare HMO | Admitting: Family Medicine

## 2020-12-09 ENCOUNTER — Other Ambulatory Visit: Payer: Self-pay

## 2020-12-09 VITALS — BP 126/70 | HR 80 | Temp 97.9°F | Ht 64.0 in | Wt 189.0 lb

## 2020-12-09 VITALS — BP 136/76 | HR 83 | Temp 97.9°F | Resp 16 | Ht 64.0 in | Wt 189.8 lb

## 2020-12-09 DIAGNOSIS — N3946 Mixed incontinence: Secondary | ICD-10-CM | POA: Insufficient documentation

## 2020-12-09 DIAGNOSIS — J309 Allergic rhinitis, unspecified: Secondary | ICD-10-CM

## 2020-12-09 DIAGNOSIS — M25512 Pain in left shoulder: Secondary | ICD-10-CM | POA: Diagnosis not present

## 2020-12-09 DIAGNOSIS — Z1211 Encounter for screening for malignant neoplasm of colon: Secondary | ICD-10-CM | POA: Diagnosis not present

## 2020-12-09 DIAGNOSIS — I10 Essential (primary) hypertension: Secondary | ICD-10-CM | POA: Diagnosis not present

## 2020-12-09 DIAGNOSIS — J4520 Mild intermittent asthma, uncomplicated: Secondary | ICD-10-CM

## 2020-12-09 DIAGNOSIS — E559 Vitamin D deficiency, unspecified: Secondary | ICD-10-CM

## 2020-12-09 DIAGNOSIS — N3941 Urge incontinence: Secondary | ICD-10-CM | POA: Insufficient documentation

## 2020-12-09 DIAGNOSIS — E038 Other specified hypothyroidism: Secondary | ICD-10-CM

## 2020-12-09 DIAGNOSIS — Z131 Encounter for screening for diabetes mellitus: Secondary | ICD-10-CM

## 2020-12-09 DIAGNOSIS — G8929 Other chronic pain: Secondary | ICD-10-CM | POA: Diagnosis not present

## 2020-12-09 DIAGNOSIS — E538 Deficiency of other specified B group vitamins: Secondary | ICD-10-CM

## 2020-12-09 DIAGNOSIS — I868 Varicose veins of other specified sites: Secondary | ICD-10-CM | POA: Insufficient documentation

## 2020-12-09 DIAGNOSIS — Z0000 Encounter for general adult medical examination without abnormal findings: Secondary | ICD-10-CM

## 2020-12-09 DIAGNOSIS — N814 Uterovaginal prolapse, unspecified: Secondary | ICD-10-CM | POA: Diagnosis not present

## 2020-12-09 NOTE — Patient Instructions (Signed)
[name] , Thank you for taking time to come for your Medicare Wellness Visit. I appreciate your ongoing commitment to your health goals. Please review the following plan we discussed and let me know if I can assist you in the future.   Screening recommendations/referrals: Colonoscopy: Referral ordered today. Someone will be calling you to schedule. Mammogram: Completed 07/02/2020-Due 07/02/2021 Bone Density: Completed 06/27/2019-Due 9//16/2022 Recommended yearly ophthalmology/optometry visit for glaucoma screening and checkup Recommended yearly dental visit for hygiene and checkup  Vaccinations: Influenza vaccine: Up to date Pneumococcal vaccine: Completed vaccines Tdap vaccine: Discuss with pharmacy Shingles vaccine: Discuss with pharmacy Covid-19:Completed vaccines  Advanced directives: Information given today  Conditions/risks identified:See problem  ist  Next appointment: Follow up in one year for your annual wellness visit    Preventive Care 65 Years and Older, Female Preventive care refers to lifestyle choices and visits with your health care provider that can promote health and wellness. What does preventive care include?  A yearly physical exam. This is also called an annual well check.  Dental exams once or twice a year.  Routine eye exams. Ask your health care provider how often you should have your eyes checked.  Personal lifestyle choices, including:  Daily care of your teeth and gums.  Regular physical activity.  Eating a healthy diet.  Avoiding tobacco and drug use.  Limiting alcohol use.  Practicing safe sex.  Taking low-dose aspirin every day.  Taking vitamin and mineral supplements as recommended by your health care provider. What happens during an annual well check? The services and screenings done by your health care provider during your annual well check will depend on your age, overall health, lifestyle risk factors, and family history of  disease. Counseling  Your health care provider may ask you questions about your:  Alcohol use.  Tobacco use.  Drug use.  Emotional well-being.  Home and relationship well-being.  Sexual activity.  Eating habits.  History of falls.  Memory and ability to understand (cognition).  Work and work Statistician.  Reproductive health. Screening  You may have the following tests or measurements:  Height, weight, and BMI.  Blood pressure.  Lipid and cholesterol levels. These may be checked every 5 years, or more frequently if you are over 12 years old.  Skin check.  Lung cancer screening. You may have this screening every year starting at age 19 if you have a 30-pack-year history of smoking and currently smoke or have quit within the past 15 years.  Fecal occult blood test (FOBT) of the stool. You may have this test every year starting at age 20.  Flexible sigmoidoscopy or colonoscopy. You may have a sigmoidoscopy every 5 years or a colonoscopy every 10 years starting at age 71.  Hepatitis C blood test.  Hepatitis B blood test.  Sexually transmitted disease (STD) testing.  Diabetes screening. This is done by checking your blood sugar (glucose) after you have not eaten for a while (fasting). You may have this done every 1-3 years.  Bone density scan. This is done to screen for osteoporosis. You may have this done starting at age 59.  Mammogram. This may be done every 1-2 years. Talk to your health care provider about how often you should have regular mammograms. Talk with your health care provider about your test results, treatment options, and if necessary, the need for more tests. Vaccines  Your health care provider may recommend certain vaccines, such as:  Influenza vaccine. This is recommended every year.  Tetanus, diphtheria, and acellular pertussis (Tdap, Td) vaccine. You may need a Td booster every 10 years.  Zoster vaccine. You may need this after age  4.  Pneumococcal 13-valent conjugate (PCV13) vaccine. One dose is recommended after age 106.  Pneumococcal polysaccharide (PPSV23) vaccine. One dose is recommended after age 30. Talk to your health care provider about which screenings and vaccines you need and how often you need them. This information is not intended to replace advice given to you by your health care provider. Make sure you discuss any questions you have with your health care provider. Document Released: 10/24/2015 Document Revised: 06/16/2016 Document Reviewed: 07/29/2015 Elsevier Interactive Patient Education  2017 [employer] Prevention in the Home Falls can cause injuries. They can happen to people of all ages. There are many things you can do to make your home safe and to help prevent falls. What can I do on the outside of my home?  Regularly fix the edges of walkways and driveways and fix any cracks.  Remove anything that might make you trip as you walk through a door, such as a raised step or threshold.  Trim any bushes or trees on the path to your home.  Use bright outdoor lighting.  Clear any walking paths of anything that might make someone trip, such as rocks or tools.  Regularly check to see if handrails are loose or broken. Make sure that both sides of any steps have handrails.  Any raised decks and porches should have guardrails on the edges.  Have any leaves, snow, or ice cleared regularly.  Use sand or salt on walking paths during winter.  Clean up any spills in your garage right away. This includes oil or grease spills. What can I do in the bathroom?  Use night lights.  Install grab bars by the toilet and in the tub and shower. Do not use towel bars as grab bars.  Use non-skid mats or decals in the tub or shower.  If you need to sit down in the shower, use a plastic, non-slip stool.  Keep the floor dry. Clean up any water that spills on the floor as soon as it happens.  Remove  soap buildup in the tub or shower regularly.  Attach bath mats securely with double-sided non-slip rug tape.  Do not have throw rugs and other things on the floor that can make you trip. What can I do in the bedroom?  Use night lights.  Make sure that you have a light by your bed that is easy to reach.  Do not use any sheets or blankets that are too big for your bed. They should not hang down onto the floor.  Have a firm chair that has side arms. You can use this for support while you get dressed.  Do not have throw rugs and other things on the floor that can make you trip. What can I do in the kitchen?  Clean up any spills right away.  Avoid walking on wet floors.  Keep items that you use a lot in easy-to-reach places.  If you need to reach something above you, use a strong step stool that has a grab bar.  Keep electrical cords out of the way.  Do not use floor polish or wax that makes floors slippery. If you must use wax, use non-skid floor wax.  Do not have throw rugs and other things on the floor that can make you trip. What can I do  with my stairs?  Do not leave any items on the stairs.  Make sure that there are handrails on both sides of the stairs and use them. Fix handrails that are broken or loose. Make sure that handrails are as long as the stairways.  Check any carpeting to make sure that it is firmly attached to the stairs. Fix any carpet that is loose or worn.  Avoid having throw rugs at the top or bottom of the stairs. If you do have throw rugs, attach them to the floor with carpet tape.  Make sure that you have a light switch at the top of the stairs and the bottom of the stairs. If you do not have them, ask someone to add them for you. What else can I do to help prevent falls?  Wear shoes that:  Do not have high heels.  Have rubber bottoms.  Are comfortable and fit you well.  Are closed at the toe. Do not wear sandals.  If you use a  stepladder:  Make sure that it is fully opened. Do not climb a closed stepladder.  Make sure that both sides of the stepladder are locked into place.  Ask someone to hold it for you, if possible.  Clearly mark and make sure that you can see:  Any grab bars or handrails.  First and last steps.  Where the edge of each step is.  Use tools that help you move around (mobility aids) if they are needed. These include:  Canes.  Walkers.  Scooters.  Crutches.  Turn on the lights when you go into a dark area. Replace any light bulbs as soon as they burn out.  Set up your furniture so you have a clear path. Avoid moving your furniture around.  If any of your floors are uneven, fix them.  If there are any pets around you, be aware of where they are.  Review your medicines with your doctor. Some medicines can make you feel dizzy. This can increase your chance of falling. Ask your doctor what other things that you can do to help prevent falls. This information is not intended to replace advice given to you by your health care provider. Make sure you discuss any questions you have with your health care provider. Document Released: 07/24/2009 Document Revised: 03/04/2016 Document Reviewed: 11/01/2014 Elsevier Interactive Patient Education  2017 [employer].

## 2020-12-09 NOTE — Progress Notes (Signed)
[facility] [address] [place] [address] Dept: [phone] Dept Fax: [phone]  Chronic Care Office Visit  Subjective:    Patient ID: [name], female    DOB: 12-22-1945, 75 y.o..   MRN: [id number]  Chief Complaint  Patient presents with  . Annual Exam    CPE, patient of Dr. Bebe Shaggy. Patient would like referral to Gynecologist for. Seems to be bruising more frequent.     History of Present Illness:  Patient is in today for reassessment of his chronic medical issues. [name] has a history of hypertension, managed with lisinopril. She has a history of hypothyroidism and is on Synthroid.  [name] has a history of mild, intermittent asthma. She had a respiratory illness in Jan. She was prescribed an albuterol inhaler and a Medrol dosepak. She notes that this is improved at this point.  [name] notes that she has had a 4-5 month history of progressive uterine prolapse. This was a problem after her pregnancies and she did use a pessary in the past. She notes now the uterus is putting more pressure on her bladder and she does have sudden urges to urinate and cannot always make it tot he bathroom in time. She wonders if she might be able to be fitted with a pessary again.  [name] has ongoing issues with bilateral knee osteoarthritis, esp. on the left. She is followed by Dr. Gladstone Lighter, who is considering a knee TJR. She also notes some left shoulder pain and decreased range of motion. Last summer, she states she tripped into a wall and injured her shoulder. Since that time, she has pain with certain movements. She notes she cannot raise the arm far over her head.  [name] raises concerns about varicose veins on her legs. These are not painful to her.  [name] raises concerns about easy bruising, noting that it doesn't take much of a pressure to cause her to have a bruise on her arms. She quit taking a daily  aspirin due to concerns for this.  Past Medical History: Patient Active Problem List   Diagnosis Date Noted  . Uterine prolapse 12/09/2020  . Urge incontinence of urine 12/09/2020  . Pain due to onychomycosis of toenails of both feet 04/22/2020  . History of hypoglycemia 12/25/2019  . Onychomycosis 12/25/2019  . Abnormal serum cholesterol 12/25/2019  . Osteopenia 07/04/2019  . Obesity (BMI 30-39.9) 12/22/2017  . Vitamin D deficiency 05/18/2016  . Constipation 09/25/2013  . Acute blood loss anemia 09/20/2013  . Osteoarthritis of right hip 09/19/2013  . 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 Heel pain 02/16/2013  . Knee pain 07/05/2012  . Bilateral bunions 07/05/2012  . Screening for malignant neoplasm of the cervix 07/05/2012  . Healthcare maintenance 07/05/2012  . Sinusitis 02/18/2012  . Anxiety and depression 11/02/2011  . OSA on CPAP 11/02/2011  . Asthma 08/12/2010  . PANIC DISORDER 05/07/2009  . Essential hypertension 12/05/2008  . Hypothyroidism 03/27/2008  . ARTHRITIS 03/27/2008  . Allergic rhinitis 08/26/2007   Past Surgical History:  Procedure Laterality Date  . CATARACT EXTRACTION     rt eye  . COLONOSCOPY    . detatched retina  2003   rt eye  . 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  Rheum arthritis Mother   . Thyroid disease Mother   . Rheum arthritis Father   . Thyroid disease Father   . Rheum arthritis Maternal Grandmother   . Rheum arthritis Maternal Grandfather   . Rheum arthritis Paternal Grandmother    Outpatient Medications Prior to Visit  Medication Sig Dispense Refill  . acetaminophen (TYLENOL) 500 MG tablet Take 500-1,000 mg by mouth as needed for mild pain or moderate pain.    Marland Kitchen albuterol (PROAIR HFA) 108 (90 Base) MCG/ACT inhaler Inhale 2 puffs  into the lungs every 6 (six) hours as needed for wheezing or shortness of breath. 1 each 0  . B Complex-C (B-COMPLEX WITH VITAMIN C) tablet Take 1 tablet by mouth daily.    . Cholecalciferol (VITAMIN D) 2000 UNITS CAPS Take 2,000 Units by mouth 2 (two) times daily.     . fexofenadine (ALLEGRA) 60 MG tablet Take 60 mg by mouth daily.    . Lactobacillus-Inulin (CULTURELLE DIGESTIVE DAILY PO) Take by mouth.    . levothyroxine (SYNTHROID) 50 MCG tablet Take 1 tablet (50 mcg total) by mouth daily. 90 tablet 3  . lisinopril (ZESTRIL) 20 MG tablet Take 1 tablet (20 mg total) by mouth at bedtime. 90 tablet 1  . nystatin (MYCOSTATIN/NYSTOP) powder Apply topically 2 (two) times daily as needed. Beneath both breasts 15 g 0  . triamcinolone ointment (KENALOG) 0.1 % Apply once daily to hands as needed for hand eczema only. 30 g 2  . ciclopirox (PENLAC) 8 % solution Apply topically at bedtime. Apply over nail and surrounding skin. Apply daily over previous coat. After seven (7) days, may remove with alcohol and continue cycle. (Patient not taking: No sig reported) 6.6 mL 0  . cyanocobalamin 1000 MCG tablet Take 1,000 mcg by mouth daily. (Patient not taking: No sig reported)    . fluticasone (FLONASE) 50 MCG/ACT nasal spray Place 2 sprays into both nostrils daily. (Patient not taking: No sig reported) 16 g 6   No facility-administered medications prior to visit.   Allergies  Allergen Reactions  . Codeine Hives and Nausea And Vomiting  . Pork-Derived Products Other (See Comments)    Migraines w/ pork and other processed meats.  . Prednisone Other (See Comments)    Causes more pain      Objective:   Today's Vitals   12/09/20 1408  BP: 126/70  Pulse: 80  Temp: 97.9 F (36.6 C)  TempSrc: Temporal  SpO2: 96%  Weight: 189 lb (85.7 kg)  Height: 5\' 4"  (1.626 m)   Body mass index is 32.44 kg/m.   General: Well developed, well nourished. No acute distress. Lungs: Clear to auscultation  bilaterally. Extremities: Multiple small spider varicose veins and a few larger varicose veins on the legs. No tenderness and no pedal   edema noted. The left shoulder has crepitance with motion. She has limited adduction and abduction of the shoulder,    though preserved internal and external rotation. Neuro:CN II-XII intact. Normal sensation and DTR bilaterally. Psych: Alert and oriented x3. Normal mood and affect.  Health Maintenance Due  Topic Date Due  . TETANUS/TDAP  03/27/2018  . COLONOSCOPY (Pts 45-50yrs Insurance coverage will need to be confirmed)  05/13/2018     Assessment & Plan:   1. Essential hypertension Blood pressure at goal on lisinopril.  2. Mild intermittent asthma without complication Clear today. She should keep her albuterol inhaler on hand for flares.  3. Allergic rhinitis, unspecified seasonality, unspecified trigger Stable on Allegra.  4.  Other specified hypothyroidism We will check her TSH level to assure appropriate dosing of her Synthroid.  - TSH; Future  5. Vitamin D deficiency On replacement. I will check levels.  - VITAMIN D 25 Hydroxy (Vit-D Deficiency, Fractures); Future  6. Screening for diabetes mellitus (DM)  - Hemoglobin A1c; Future  7. Uterine prolapse 8. Urge incontinence of urine Agree with need for GYN referral to consider pessary fitting vs. Other surgical options.  - Ambulatory referral to Gynecology  9. B12 deficiency Prior history. Off supplements at this point. We will reassess B12 level.  - Vitamin B12; Future  Haydee Salter, MD

## 2020-12-09 NOTE — Progress Notes (Signed)
Subjective:   Leoda Smithhart is a 75 y.o. female who presents for Medicare Annual (Subsequent) preventive examination.  Review of Systems     Cardiac Risk Factors include: advanced age (>61men, >54 women);hypertension;obesity (BMI >30kg/m2)     Objective:    Today's Vitals   12/09/20 1247 12/09/20 1251  BP: 136/76   Pulse: 83   Resp: 16   Temp: 97.9 F (36.6 C)   TempSrc: Temporal   SpO2: 97%   Weight: 189 lb 12.8 oz (86.1 kg)   Height: 5\' 4"  (1.626 m)   PainSc:  5    Body mass index is 32.58 kg/m.  Advanced Directives 12/09/2020 11/16/2019 11/30/2016 02/04/2016 09/20/2013 09/11/2013  Does Patient Have a Medical Advance Directive? No No Yes No Patient does not have advance directive;Patient would like information Patient does not have advance directive;Patient would like information  Does patient want to make changes to medical advance directive? - - Yes (MAU/Ambulatory/Procedural Areas - Information given) - - -  Would patient like information on creating a medical advance directive? Yes (MAU/Ambulatory/Procedural Areas - Information given) No - Patient declined - No - patient declined information - Advance directive packet given  Pre-existing out of facility DNR order (yellow form or pink MOST form) - - - - No No    Current Medications (verified) Outpatient Encounter Medications as of 12/09/2020  Medication Sig  . acetaminophen (TYLENOL) 500 MG tablet Take 500-1,000 mg by mouth as needed for mild pain or moderate pain.  Marland Kitchen albuterol (PROAIR HFA) 108 (90 Base) MCG/ACT inhaler Inhale 2 puffs into the lungs every 6 (six) hours as needed for wheezing or shortness of breath.  . Cholecalciferol (VITAMIN D) 2000 UNITS CAPS Take 2,000 Units by mouth 2 (two) times daily.   . fexofenadine (ALLEGRA) 60 MG tablet Take 60 mg by mouth daily.  Marland Kitchen levothyroxine (SYNTHROID) 50 MCG tablet Take 1 tablet (50 mcg total) by mouth daily.  Marland Kitchen lisinopril (ZESTRIL) 20 MG tablet Take 1 tablet (20 mg total)  by mouth at bedtime.  Marland Kitchen nystatin (MYCOSTATIN/NYSTOP) powder Apply topically 2 (two) times daily as needed. Beneath both breasts  . triamcinolone ointment (KENALOG) 0.1 % Apply once daily to hands as needed for hand eczema only.  . ciclopirox (PENLAC) 8 % solution Apply topically at bedtime. Apply over nail and surrounding skin. Apply daily over previous coat. After seven (7) days, may remove with alcohol and continue cycle. (Patient not taking: Reported on 12/09/2020)  . cyanocobalamin 1000 MCG tablet Take 1,000 mcg by mouth daily. (Patient not taking: Reported on 12/09/2020)  . fluticasone (FLONASE) 50 MCG/ACT nasal spray Place 2 sprays into both nostrils daily. (Patient not taking: Reported on 12/09/2020)  . [DISCONTINUED] methylPREDNISolone (MEDROL DOSEPAK) 4 MG TBPK tablet Take as directed  . [DISCONTINUED] Zoster Vaccine Adjuvanted [facility]) injection 0.70ml IM now and again in 2-6 months   No facility-administered encounter medications on file as of 12/09/2020.    Allergies (verified) Codeine, Pork-derived products, and Prednisone   History: Past Medical History:  Diagnosis Date  . Arthritis   . Asthma    mild  . H/O bronchitis   . Hx of blood clots 1967   from birth control in LUE  . Hypertension   . Hypoglycemia   . Hypothyroidism   . Migraine    from food  . Osteopenia 07/04/2019  . Pneumonia    hx of  . Seasonal allergies   . Sleep apnea   . UTI (urinary tract infection)  Past Surgical History:  Procedure Laterality Date  . CATARACT EXTRACTION     rt eye  . COLONOSCOPY    . detatched retina  2003   rt eye  . 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 Rheum arthritis Mother   . Thyroid disease Mother   . Rheum arthritis Father   . Thyroid disease Father   . Rheum arthritis Maternal  Grandmother   . Rheum arthritis Maternal Grandfather   . Rheum arthritis Paternal Grandmother    Social History   Socioeconomic History  . Marital status: Legally Separated    Spouse name: Not on file  . Number of children: Not on file  . Years of education: Not on file  . Highest education level: Not on file  Occupational History  . Not on file  Tobacco Use  . Smoking status: Former Smoker    Packs/day: 3.00    Years: 13.00    Pack years: 39.00    Types: Cigarettes    Quit date: 10/11/1974    Years since quitting: 46.1  . Smokeless tobacco: Never Used  Substance and Sexual Activity  . Alcohol use: No    Alcohol/week: 0.0 standard drinks  . Drug use: No  . Sexual activity: Never  Other Topics Concern  . Not on file  Social History Narrative  . Not on file   Social Determinants of Health   Financial Resource Strain: Low Risk   . Difficulty of Paying Living Expenses: Not hard at all  Food Insecurity: No Food Insecurity  . Worried About Charity fundraiser in the Last Year: Never true  . Ran Out of Food in the Last Year: Never true  Transportation Needs: No Transportation Needs  . Lack of Transportation (Medical): No  . Lack of Transportation (Non-Medical): No  Physical Activity: Inactive  . Days of Exercise per Week: 0 days  . Minutes of Exercise per Session: 0 min  Stress: No Stress Concern Present  . Feeling of Stress : Not at all  Social Connections: Moderately Isolated  . Frequency of Communication with Friends and Family: More than three times a week  . Frequency of Social Gatherings with Friends and Family: More than three times a week  . Attends Religious Services: 1 to 4 times per year  . Active Member of Clubs or Organizations: No  . Attends Archivist Meetings: Never  . Marital Status: Separated    Tobacco Counseling Counseling given: Not Answered   Clinical Intake:  Pre-visit preparation completed: Yes  Pain : 0-10 Pain Score: 5   Pain Type: Chronic pain Pain Location: Knee Pain Onset: More than a month ago Pain Frequency: Constant     Nutritional Status: BMI > 30  Obese Nutritional Risks: None Diabetes: No  How often do you need to have someone help you when you read instructions, pamphlets, or other written materials from your doctor or pharmacy?: 1 - Never  Diabetic?No  Interpreter Needed?: No  Information entered by :: Caroleen Hamman LPN   Activities of Daily Living In your present state of health, do you have any difficulty performing the following activities: 12/09/2020  Hearing? N  Vision? N  Difficulty concentrating or making decisions? N  Walking or climbing stairs? N  Dressing or bathing? N  Doing errands,  shopping? N  Preparing Food and eating ? N  Using the Toilet? N  In the past six months, have you accidently leaked urine? Y  Comment occasionally  Do you have problems with loss of bowel control? Y  Comment occasionally  Managing your Medications? N  Managing your Finances? N  Housekeeping or managing your Housekeeping? N  Some recent data might be hidden    Patient Care Team: Libby Maw, MD as PCP - General (Family Medicine) Latanya Maudlin, MD as Consulting Physician (Orthopedic Surgery) Rigoberto Noel, MD as Consulting Physician (Pulmonary Disease)  Indicate any recent Medical Services you may have received from other than [facility] providers in the past year (date may be approximate).     Assessment:   This is a routine wellness examination for Makenah Karas.  Hearing/Vision screen  Hearing Screening   125Hz  250Hz  500Hz  1000Hz  2000Hz  3000Hz  4000Hz  6000Hz  8000Hz   Right ear:           Left ear:           Comments: No issues  Vision Screening Comments: Reading glasses Last eye exam-4 yrs ago  Dietary issues and exercise activities discussed: Current Exercise Habits: The patient does not participate in regular exercise at present, Exercise limited by: orthopedic  condition(s) (knee pain)  Goals    . Increase physical activity      Depression Screen PHQ 2/9 Scores 12/09/2020 11/16/2019 06/07/2018 05/30/2017 11/30/2016 11/19/2015 05/15/2015  PHQ - 2 Score 0 0 0 0 0 0 0  PHQ- 9 Score - - 0 1 - - -    Fall Risk Fall Risk  12/09/2020 03/27/2020 12/25/2019 11/16/2019 12/02/2017  Falls in the past year? 1 0 1 1 No  Comment - - - pt tripped walking out of [employer] -  Number falls in past yr: 0 - 1 0 -  Injury with Fall? 0 - 0 0 -  Comment - - scrap on knee - -  Risk for fall due to : History of fall(s) - - - -  Follow up Falls prevention discussed - - Education provided;Falls prevention discussed -    FALL RISK PREVENTION PERTAINING TO THE HOME:  Any stairs in or around the home? No  Home free of loose throw rugs in walkways, pet beds, electrical cords, etc? Yes  Adequate lighting in your home to reduce risk of falls? Yes   ASSISTIVE DEVICES UTILIZED TO PREVENT FALLS:  Life alert? No  Use of a cane, walker or w/c? Yes  Grab bars in the bathroom? Yes  Shower chair or bench in shower? No  Elevated toilet seat or a handicapped toilet? No   TIMED UP AND GO:  Was the test performed? Yes .  Length of time to ambulate 10 feet: 10 sec.   Gait slow and steady with assistive device  Cognitive Function:Normal cognitive status assessed by direct observation by this Nurse Health Advisor. No abnormalities found.   MMSE - Mini Mental State Exam 11/30/2016  Orientation to time 5  Orientation to Place 5  Registration 3  Attention/ Calculation 5  Recall 3  Language- name 2 objects 2  Language- repeat 1  Language- follow 3 step command 3  Language- read & follow direction 1  Write a sentence 1  Copy design 1  Total score 30        Immunizations Immunization History  Administered Date(s) Administered  . Fluad Quad(high Dose 65+) 06/12/2019, 07/22/2020  . Influenza Split 07/20/2011, 07/05/2012  .  Influenza Whole 08/31/2007, 07/18/2008, 07/17/2009,  07/09/2010  . Influenza, High Dose Seasonal PF 08/04/2013, 07/04/2015, 07/21/2016, 07/22/2017, 08/07/2018  . Influenza,inj,Quad PF,6+ Mos 08/06/2014  . PFIZER(Purple Top)SARS-COV-2 Vaccination 12/04/2019, 12/25/2019, 08/09/2020  . Pneumococcal Conjugate-13 11/14/2014  . Pneumococcal Polysaccharide-23 09/27/2007, 11/19/2015  . Td 03/27/2008  . Zoster 11/02/2011    TDAP status: Due, Education has been provided regarding the importance of this vaccine. Advised may receive this vaccine at local pharmacy or Health Dept. Aware to provide a copy of the vaccination record if obtained from local pharmacy or Health Dept. Verbalized acceptance and understanding.  Flu Vaccine status: Up to date  Pneumococcal vaccine status: Up to date  Covid-19 vaccine status: Completed vaccines  Qualifies for Shingles Vaccine? Yes   Zostavax completed Yes   Shingrix Completed?: No.    Education has been provided regarding the importance of this vaccine. Patient has been advised to call insurance company to determine out of pocket expense if they have not yet received this vaccine. Advised may also receive vaccine at local pharmacy or Health Dept. Verbalized acceptance and understanding.  Screening Tests Health Maintenance  Topic Date Due  . TETANUS/TDAP  03/27/2018  . COLONOSCOPY (Pts 45-89yrs Insurance coverage will need to be confirmed)  05/13/2018  . INFLUENZA VACCINE  Completed  . DEXA SCAN  Completed  . COVID-19 Vaccine  Completed  . Hepatitis C Screening  Completed  . PNA vac Low Risk Adult  Completed  . HPV VACCINES  Aged Out    Health Maintenance  Health Maintenance Due  Topic Date Due  . TETANUS/TDAP  03/27/2018  . COLONOSCOPY (Pts 45-57yrs Insurance coverage will need to be confirmed)  05/13/2018    Colorectal cancer screening: Referral to GI placed today. Pt aware the office will call re: appt.  Mammogram status: Completed Bilateral-07/02/2020. Repeat every year  Bone Density status:  Completed 06/27/2019. Results reflect: Bone density results: OSTEOPENIA. Repeat every 2 years.  Lung Cancer Screening: (Low Dose CT Chest recommended if Age 6-80 years, 30 pack-year currently smoking OR have quit w/in 15years.) does not qualify.    Additional Screening:  Hepatitis C Screening:Completed 11/30/2016  Vision Screening: Recommended annual ophthalmology exams for early detection of glaucoma and other disorders of the eye. Is the patient up to date with their annual eye exam?  No  Who is the provider or what is the name of the office in which the patient attends annual eye exams? Unsure of name Patient plans to make an appt soon  Dental Screening: Recommended annual dental exams for proper oral hygiene  Community Resource Referral / Chronic Care Management: CRR required this visit?  No   CCM required this visit?  No      Plan:     I have personally reviewed and noted the following in the patient's chart:   . Medical and social history . Use of alcohol, tobacco or illicit drugs  . Current medications and supplements . Functional ability and status . Nutritional status . Physical activity . Advanced directives . List of other physicians . Hospitalizations, surgeries, and ER visits in previous 12 months . Vitals . Screenings to include cognitive, depression, and falls . Referrals and appointments  In addition, I have reviewed and discussed with patient certain preventive protocols, quality metrics, and best practice recommendations. A written personalized care plan for preventive services as well as general preventive health recommendations were provided to patient.     Marta Antu, LPN   11/14/5571  Nurse Health Advisor  Nurse Notes: None

## 2020-12-10 ENCOUNTER — Other Ambulatory Visit (INDEPENDENT_AMBULATORY_CARE_PROVIDER_SITE_OTHER): Payer: Medicare HMO

## 2020-12-10 DIAGNOSIS — E038 Other specified hypothyroidism: Secondary | ICD-10-CM | POA: Diagnosis not present

## 2020-12-10 DIAGNOSIS — Z131 Encounter for screening for diabetes mellitus: Secondary | ICD-10-CM | POA: Diagnosis not present

## 2020-12-10 DIAGNOSIS — E559 Vitamin D deficiency, unspecified: Secondary | ICD-10-CM | POA: Diagnosis not present

## 2020-12-10 DIAGNOSIS — E538 Deficiency of other specified B group vitamins: Secondary | ICD-10-CM

## 2020-12-10 LAB — VITAMIN B12: Vitamin B-12: 444 pg/mL (ref 211–911)

## 2020-12-10 LAB — HEMOGLOBIN A1C: Hgb A1c MFr Bld: 5.8 % (ref 4.6–6.5)

## 2020-12-10 LAB — TSH: TSH: 3.03 u[IU]/mL (ref 0.35–4.50)

## 2020-12-10 LAB — VITAMIN D 25 HYDROXY (VIT D DEFICIENCY, FRACTURES): VITD: 52.02 ng/mL (ref 30.00–100.00)

## 2020-12-10 NOTE — Progress Notes (Signed)
Per orders of Dr. Ethelene Hal pt is here for labs only, pt tolerated lab draw well

## 2020-12-11 NOTE — Progress Notes (Signed)
Patient has not yet accessed My Chart. I will ask MA to call her about her lab results.  - HgbA1c is normal, ruling out diabetes. - Vitamin B12 normal. No need to resume replacement. - Vitamin D normal. Continue current dose of supplement. - TSH in normal range. Continue current levothyroxine dose.

## 2020-12-15 ENCOUNTER — Encounter: Payer: Self-pay | Admitting: Gastroenterology

## 2021-01-16 ENCOUNTER — Ambulatory Visit: Payer: Medicare HMO | Admitting: Obstetrics and Gynecology

## 2021-01-16 ENCOUNTER — Other Ambulatory Visit: Payer: Self-pay

## 2021-01-16 ENCOUNTER — Encounter: Payer: Self-pay | Admitting: Obstetrics and Gynecology

## 2021-01-16 VITALS — BP 130/82 | Ht 64.0 in | Wt 194.0 lb

## 2021-01-16 DIAGNOSIS — N8111 Cystocele, midline: Secondary | ICD-10-CM | POA: Diagnosis not present

## 2021-01-16 DIAGNOSIS — N814 Uterovaginal prolapse, unspecified: Secondary | ICD-10-CM

## 2021-01-16 DIAGNOSIS — N3946 Mixed incontinence: Secondary | ICD-10-CM | POA: Diagnosis not present

## 2021-01-16 LAB — URINALYSIS, MICROSCOPIC ONLY
Bacteria, UA: NONE SEEN /HPF
Hyaline Cast: NONE SEEN /LPF
RBC / HPF: NONE SEEN /HPF (ref 0–2)
WBC, UA: NONE SEEN /HPF (ref 0–5)

## 2021-01-16 NOTE — Patient Instructions (Signed)

## 2021-01-16 NOTE — Progress Notes (Signed)
75 y.o. [id number]. Legally Separated White or Caucasian Not Hispanic or Latino female here for evaluation of prolapse.   The patient reports worsening uterine prolapse in the last year. She really notices it when she voids or showers.  She c/o mixed urinary incontinence, worse in the last 6 months. Urge >> Stress.  She feels she empties her bladder okay. She waits a little while on the toilet. She leaks large amounts, changes her pad 4-5 x a day. Drinks a lot of water. No nocturia most nights.  BM daily, goes from soft to almost hard. Uses a probiotic.   H/O 3 FTNSVD's, largest was 9lb8oz.    No LMP recorded. Patient is postmenopausal.          Sexually active: No.   Exercising: walking and stretching (needs left knee replacement) Smoker: No  Health Maintenance: Pap:11-30-16 History of abnormal Pap:No MMG: 07-02-20 BMD:06-27-19 Colonoscopy:2009, scheduled for next month.    reports that she quit smoking about 46 years ago. Her smoking use included cigarettes. She has a 39.00 pack-year smoking history. She has never used smokeless tobacco. She reports that she does not drink alcohol and does not use drugs.3 daughters, 8 grandchildren, 5 great grandchildren. 2 daughters are local, youngest daughter in [place].   Past Medical History:  Diagnosis Date  . Arthritis   . Asthma    mild  . H/O bronchitis   . Hx of blood clots 1967   from birth control in LUE  . Hypertension   . Hypoglycemia   . Hypothyroidism   . Migraine    from food  . Osteopenia 07/04/2019  . Pneumonia    hx of  . Seasonal allergies   . Sleep apnea   . UTI (urinary tract infection)   H/O left arm thrombophlebitis on OCP's in 1967.  Past Surgical History:  Procedure Laterality Date  . CATARACT EXTRACTION     rt eye  . COLONOSCOPY    . detatched retina  2003   rt eye  . 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   Procedure: RIGHT TOTAL HIP ARTHROPLASTY;  Surgeon: Tobi Bastos, MD;  Location: [facility];  Service:  Orthopedics;  Laterality: Right;    Current Outpatient Medications  Medication Sig Dispense Refill  . acetaminophen (TYLENOL) 500 MG tablet Take 500-1,000 mg by mouth as needed for mild pain or moderate pain.    Marland Kitchen albuterol (PROAIR HFA) 108 (90 Base) MCG/ACT inhaler Inhale 2 puffs into the lungs every 6 (six) hours as needed for wheezing or shortness of breath. 1 each 0  . B Complex-C (B-COMPLEX WITH VITAMIN C) tablet Take 1 tablet by mouth daily.    . Cholecalciferol (VITAMIN D) 2000 UNITS CAPS Take 2,000 Units by mouth 2 (two) times daily.     . fexofenadine (ALLEGRA) 60 MG tablet Take 60 mg by mouth daily.    . Lactobacillus-Inulin (CULTURELLE DIGESTIVE DAILY PO) Take by mouth.    . levothyroxine (SYNTHROID) 50 MCG tablet Take 1 tablet (50 mcg total) by mouth daily. 90 tablet 3  . lisinopril (ZESTRIL) 20 MG tablet Take 1 tablet (20 mg total) by mouth at bedtime. 90 tablet 1  . nystatin (MYCOSTATIN/NYSTOP) powder Apply topically 2 (two) times daily as needed. Beneath both breasts 15 g 0  . triamcinolone ointment (KENALOG) 0.1 % Apply once daily to hands as needed for hand eczema only. 30 g 2   No current facility-administered medications for this visit.    Family History  Problem Relation  Age of Onset  . Rheum arthritis Mother   . Thyroid disease Mother   . Rheum arthritis Father   . Thyroid disease Father   . Rheum arthritis Maternal Grandmother   . Rheum arthritis Maternal Grandfather   . Rheum arthritis Paternal Grandmother     Review of Systems  Genitourinary:       Prolapse    Exam:   There were no vitals taken for this visit.  Weight change: @WEIGHTCHANGE @ Height:      Ht Readings from Last 3 Encounters:  12/09/20 5\' 4"  (1.626 m)  12/09/20 5\' 4"  (1.626 m)  06/03/20 5\' 4"  (1.626 m)    General appearance: alert, cooperative and appears stated age   Pelvic: External genitalia:  no lesions              Urethra:  normal appearing urethra with no masses, tenderness or  lesions              Bartholins and Skenes: normal                 Vagina: mildly atrophic appearing vagina with a grade 1-2 cystocele and grade 1-2 uterine prolapse               Cervix: no lesions               Bimanual Exam:  Uterus:  no masses or tenderness              Adnexa: no mass, fullness, tenderness                 Caryn Bee chaperoned for the exam.  1. Uterine prolapse Mild today, I don't think I'm seeing the full prolapse today Recommended she return for a pessary fitting, try to come back after she has been on her feet most of the day (would like to see the full effect of her prolapse). Mainly trying the pessary to see if it helps her urge incontinence, discussed that it could worsen her stress incontinence  2. Midline cystocele As above  3. Mixed incontinence As above If the pessary doesn't help discussed the option of medication or PT - Urine Microscopic - Urine Culture  CC: Dr Gena Fray

## 2021-01-17 LAB — URINE CULTURE
MICRO NUMBER:: 11748782
SPECIMEN QUALITY:: ADEQUATE

## 2021-01-27 ENCOUNTER — Emergency Department (HOSPITAL_COMMUNITY): Payer: Medicare HMO

## 2021-01-27 ENCOUNTER — Emergency Department (HOSPITAL_COMMUNITY)
Admission: EM | Admit: 2021-01-27 | Discharge: 2021-01-27 | Disposition: A | Discharge status: Home / Self Care | Payer: Medicare HMO | Attending: Emergency Medicine | Admitting: Emergency Medicine

## 2021-01-27 ENCOUNTER — Encounter (HOSPITAL_COMMUNITY): Payer: Self-pay

## 2021-01-27 ENCOUNTER — Other Ambulatory Visit: Payer: Self-pay

## 2021-01-27 DIAGNOSIS — Z9889 Other specified postprocedural states: Secondary | ICD-10-CM

## 2021-01-27 DIAGNOSIS — Z96641 Presence of right artificial hip joint: Secondary | ICD-10-CM | POA: Diagnosis not present

## 2021-01-27 DIAGNOSIS — Z87891 Personal history of nicotine dependence: Secondary | ICD-10-CM | POA: Insufficient documentation

## 2021-01-27 DIAGNOSIS — T84020A Dislocation of internal right hip prosthesis, initial encounter: Secondary | ICD-10-CM | POA: Insufficient documentation

## 2021-01-27 DIAGNOSIS — X501XXA Overexertion from prolonged static or awkward postures, initial encounter: Secondary | ICD-10-CM | POA: Insufficient documentation

## 2021-01-27 DIAGNOSIS — E039 Hypothyroidism, unspecified: Secondary | ICD-10-CM | POA: Diagnosis not present

## 2021-01-27 DIAGNOSIS — I10 Essential (primary) hypertension: Secondary | ICD-10-CM | POA: Insufficient documentation

## 2021-01-27 DIAGNOSIS — R001 Bradycardia, unspecified: Secondary | ICD-10-CM | POA: Diagnosis not present

## 2021-01-27 DIAGNOSIS — Z79899 Other long term (current) drug therapy: Secondary | ICD-10-CM | POA: Insufficient documentation

## 2021-01-27 DIAGNOSIS — S73004A Unspecified dislocation of right hip, initial encounter: Secondary | ICD-10-CM | POA: Diagnosis not present

## 2021-01-27 DIAGNOSIS — J45909 Unspecified asthma, uncomplicated: Secondary | ICD-10-CM | POA: Diagnosis not present

## 2021-01-27 DIAGNOSIS — S79911A Unspecified injury of right hip, initial encounter: Secondary | ICD-10-CM | POA: Diagnosis present

## 2021-01-27 DIAGNOSIS — S73034A Other anterior dislocation of right hip, initial encounter: Secondary | ICD-10-CM

## 2021-01-27 DIAGNOSIS — R52 Pain, unspecified: Secondary | ICD-10-CM

## 2021-01-27 LAB — CBG MONITORING, ED: Glucose-Capillary: 99 mg/dL (ref 70–99)

## 2021-01-27 MED ORDER — KETAMINE HCL 50 MG/5ML IJ SOSY
PREFILLED_SYRINGE | INTRAMUSCULAR | Status: AC
  Filled 2021-01-27: qty 5

## 2021-01-27 MED ORDER — FENTANYL CITRATE (PF) 100 MCG/2ML IJ SOLN
100.0000 ug | Freq: Once | INTRAMUSCULAR | Status: AC
Start: 2021-01-27 — End: 2021-01-27
  Administered 2021-01-27: 100 ug via INTRAVENOUS
  Filled 2021-01-27: qty 2

## 2021-01-27 MED ORDER — PROPOFOL 10 MG/ML IV BOLUS
INTRAVENOUS | Status: AC | PRN
  Administered 2021-01-27 (×3): 10 mg via INTRAVENOUS
  Administered 2021-01-27: 20 mg via INTRAVENOUS
  Administered 2021-01-27: 10 mg via INTRAVENOUS

## 2021-01-27 MED ORDER — KETAMINE HCL 10 MG/ML IJ SOLN
INTRAMUSCULAR | Status: AC | PRN
  Administered 2021-01-27 (×2): 10 mg via INTRAVENOUS
  Administered 2021-01-27: 20 mg via INTRAVENOUS
  Administered 2021-01-27 (×2): 10 mg via INTRAVENOUS

## 2021-01-27 MED ORDER — KETAMINE HCL 50 MG/5ML IJ SOSY
0.5000 mg/kg | PREFILLED_SYRINGE | Freq: Once | INTRAMUSCULAR | Status: DC
  Filled 2021-01-27: qty 5

## 2021-01-27 MED ORDER — PROPOFOL 10 MG/ML IV BOLUS
0.5000 mg/kg | Freq: Once | INTRAVENOUS | Status: DC
  Filled 2021-01-27: qty 20

## 2021-01-27 NOTE — ED Triage Notes (Signed)
Pt BIB [facility] from home, called EMS after R hip "popped out" while sitting on edge of the bed with legs crossed. Hx of R hip replacement in 2014. HR 45 on EMS arrival and upon arrival to ED. Pt A&Ox4, verbal, and communicating needs.

## 2021-01-27 NOTE — ED Provider Notes (Signed)
[facility] EMERGENCY DEPARTMENT Provider Note   CSN: [id number] Arrival date & time: 01/27/21  1002     History Chief Complaint  Patient presents with  . Hip Pain    [name] is a 75 y.o. female.  HPI She was sitting on her bed, with her right leg crossed over the left, bent forward and felt her right hip pop.  She was unable to ambulate afterwards.  She had a right hip replacement, 7 years ago.  She presents by EMS after receiving 200 mcg of fentanyl for pain control.  No prior histories of hip dislocation.  No other recent trauma.  She took her morning medications but has not eaten yet this morning.  She denies other recent or concurrent illnesses.  There are no other known modifying factors.    Past Medical History:  Diagnosis Date  . Arthritis   . Asthma    mild  . H/O bronchitis   . Hx of blood clots 1967   from birth control in LUE  . Hypertension   . Hypoglycemia   . Hypothyroidism   . Migraine    from food  . Osteopenia 07/04/2019  . Pneumonia    hx of  . Seasonal allergies   . Sleep apnea   . UTI (urinary tract infection)     Patient Active Problem List   Diagnosis Date Noted  . Uterine prolapse 12/09/2020  . Urge incontinence of urine 12/09/2020  . Chronic left shoulder pain 12/09/2020  . 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  . Pain due to onychomycosis of toenails of both feet 04/22/2020  . History of hypoglycemia 12/25/2019  . Onychomycosis 12/25/2019  . Abnormal serum cholesterol 12/25/2019  . Osteopenia 07/04/2019  . Obesity (BMI 30-39.9) 12/22/2017  . Vitamin D deficiency 05/18/2016  . Constipation 09/25/2013  . Acute blood loss anemia 09/20/2013  . Osteoarthritis of right hip 09/19/2013  . 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 Heel pain 02/16/2013  . Knee pain 07/05/2012  . Bilateral bunions 07/05/2012  . Screening for malignant neoplasm of the cervix 07/05/2012  . Healthcare maintenance 07/05/2012  .  Sinusitis 02/18/2012  . Anxiety and depression 11/02/2011  . OSA on CPAP 11/02/2011  . Asthma 08/12/2010  . PANIC DISORDER 05/07/2009  . Essential hypertension 12/05/2008  . Hypothyroidism 03/27/2008  . ARTHRITIS 03/27/2008  . Allergic rhinitis 08/26/2007    Past Surgical History:  Procedure Laterality Date  . CATARACT EXTRACTION     rt eye  . COLONOSCOPY    . detatched retina  2003   rt eye  . 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   Procedure: RIGHT TOTAL HIP ARTHROPLASTY;  Surgeon: Tobi Bastos, MD;  Location: [facility];  Service: Orthopedics;  Laterality: Right;     OB History    Gravida  3   Para  3   Term  3   Preterm      AB      Living  3     SAB      IAB      Ectopic      Multiple      Live Births  3           Family History  Problem Relation Age of Onset  . Thyroid disease Mother   . Osteoarthritis Mother   . Thyroid disease Father   . Osteoarthritis Father   . Osteoarthritis Maternal Grandmother   . Osteoarthritis Maternal Grandfather   .  Osteoarthritis Paternal Grandmother     Social History   Tobacco Use  . Smoking status: Former Smoker    Packs/day: 3.00    Years: 13.00    Pack years: 39.00    Types: Cigarettes    Quit date: 10/11/1974    Years since quitting: 46.3  . Smokeless tobacco: Never Used  Vaping Use  . Vaping Use: Never used  Substance Use Topics  . Alcohol use: No    Alcohol/week: 0.0 standard drinks  . Drug use: No    Home Medications Prior to Admission medications   Medication Sig Start Date End Date Taking? Authorizing Provider  acetaminophen (TYLENOL) 500 MG tablet Take 500-1,000 mg by mouth as needed for mild pain or moderate pain.    [provider]  albuterol (PROAIR HFA) 108 (90 Base) MCG/ACT inhaler Inhale 2 puffs into the lungs every 6 (six) hours as needed for wheezing or shortness of breath. 10/30/20   Lucretia Kern, DO  B Complex-C (B-COMPLEX WITH VITAMIN C) tablet Take 1 tablet by  mouth daily.    [provider]  Cholecalciferol (VITAMIN D) 2000 UNITS CAPS Take 2,000 Units by mouth 2 (two) times daily.     [provider]  fexofenadine (ALLEGRA) 60 MG tablet Take 60 mg by mouth daily.    [provider]  Lactobacillus-Inulin (CULTURELLE DIGESTIVE DAILY PO) Take by mouth.    [provider]  levothyroxine (SYNTHROID) 50 MCG tablet Take 1 tablet (50 mcg total) by mouth daily. 03/28/20   Libby Maw, MD  lisinopril (ZESTRIL) 20 MG tablet Take 1 tablet (20 mg total) by mouth at bedtime. 01/26/20   Libby Maw, MD  nystatin (MYCOSTATIN/NYSTOP) powder Apply topically 2 (two) times daily as needed. Beneath both breasts 12/06/18   Debbrah Alar, NP  triamcinolone ointment (KENALOG) 0.1 % Apply once daily to hands as needed for hand eczema only. 12/28/19   Libby Maw, MD    Allergies    Codeine, Pork-derived products, and Prednisone  Review of Systems   Review of Systems  All other systems reviewed and are negative.   Physical Exam Updated Vital Signs BP (!) 152/72   Pulse (!) 143   Temp 97.6 F (36.4 C) (Oral)   Resp (!) 22   SpO2 95%   Physical Exam Vitals and nursing note reviewed.  Constitutional:      General: She is in acute distress (She is uncomfortable).     Appearance: She is well-developed. She is obese. She is not ill-appearing, toxic-appearing or diaphoretic.  HENT:     Head: Normocephalic and atraumatic.     Right Ear: External ear normal.     Left Ear: External ear normal.  Eyes:     Conjunctiva/sclera: Conjunctivae normal.     Pupils: Pupils are equal, round, and reactive to light.  Neck:     Trachea: Phonation normal.  Cardiovascular:     Rate and Rhythm: Normal rate and regular rhythm.     Heart sounds: Normal heart sounds.  Pulmonary:     Effort: Pulmonary effort is normal.     Breath sounds: Normal breath sounds.  Abdominal:     General: There is no distension.      Palpations: Abdomen is soft.     Tenderness: There is no abdominal tenderness.  Musculoskeletal:     Cervical back: Normal range of motion and neck supple.     Comments: Right leg shortened, right hip internally rotated and guards  against any movement.  Neurovascular intact distally in the right foot.  Skin:    General: Skin is warm and dry.  Neurological:     Mental Status: She is alert and oriented to person, place, and time.     Cranial Nerves: No cranial nerve deficit.     Sensory: No sensory deficit.     Motor: No abnormal muscle tone.     Coordination: Coordination normal.  Psychiatric:        Behavior: Behavior normal.        Thought Content: Thought content normal.        Judgment: Judgment normal.     ED Results / Procedures / Treatments   Labs (all labs ordered are listed, but only abnormal results are displayed) Labs Reviewed  CBG MONITORING, ED    EKG EKG Interpretation  Date/Time:  Tuesday January 27 2021 10:13:26 EDT Ventricular Rate:  43 PR Interval:  179 QRS Duration: 108 QT Interval:  489 QTC Calculation: 414 R Axis:   80 Text Interpretation: Sinus bradycardia Nonspecific T abnormalities, anterior leads Since last tracing rate slower Otherwise no significant change Confirmed by Daleen Bo [phone]) on 01/27/2021 11:04:46 AM     Radiology DG Pelvis Portable  Result Date: 01/27/2021 CLINICAL DATA:  Postreduction EXAM: PORTABLE PELVIS 1-2 VIEWS COMPARISON:  01/27/2021 right hip radiographs FINDINGS: No residual malalignment at the right hip joint on these frontal views. Right total hip arthroplasty with no evidence of hardware fracture or loosening. No osseous fracture. No focal osseous lesions. Degenerative changes in the visualized lower lumbar spine. IMPRESSION: Successful reduction of right hip dislocation with no residual right hip malalignment on these frontal views. Right total hip arthroplasty with no evidence of hardware complication. No osseous  fracture. Electronically Signed   By: Ilona Sorrel M.D.   On: 01/27/2021 13:25   DG Hip Unilat  With Pelvis 2-3 Views Right  Result Date: 01/27/2021 CLINICAL DATA:  Hip pain EXAM: DG HIP (WITH OR WITHOUT PELVIS) 2-3V RIGHT COMPARISON:  None. FINDINGS: Previous right total hip arthroplasty with superior dislocation of the femoral component with respect to the acetabular cup. No signs of periprosthetic fracture. IMPRESSION: 1. Superior dislocation of the right hip arthroplasty device. Electronically Signed   By: Kerby Moors M.D.   On: 01/27/2021 11:08    Procedures .Sedation  Date/Time: 01/27/2021 12:51 PM Performed by: Daleen Bo, MD Authorized by: Daleen Bo, MD   Consent:    Consent obtained:  Written   Consent given by:  Patient   Risks discussed:  Inadequate sedation, nausea, vomiting and respiratory compromise necessitating ventilatory assistance and intubation   Alternatives discussed:  Analgesia without sedation Universal protocol:    Immediately prior to procedure, a time out was called: yes   Indications:    Procedure performed:  Dislocation reduction Pre-sedation assessment:    Time since last food or drink:  14 hr   ASA classification: class 3 - patient with severe systemic disease     Mouth opening:  3 or more finger widths   Mallampati score:  III - soft palate, base of uvula visible   Neck mobility: normal     Pre-sedation assessments completed and reviewed: airway patency, cardiovascular function, hydration status, mental status and respiratory function     Pre-sedation assessments completed and reviewed: pre-procedure nausea and vomiting status not reviewed and pre-procedure pain level not reviewed     Pre-sedation assessment completed:  01/27/2021 12:25 PM Immediate pre-procedure details:    Reassessment: Patient  reassessed immediately prior to procedure     Reviewed: vital signs, relevant labs/tests and NPO status     Verified: bag valve mask available,  emergency equipment available, intubation equipment available, IV patency confirmed and oxygen available   Procedure details (see MAR for exact dosages):    Preoxygenation:  Nasal cannula   Sedation:  Ketamine and propofol   Intended level of sedation: deep   Analgesia:  Fentanyl   Intra-procedure monitoring:  Blood pressure monitoring, continuous capnometry, continuous pulse oximetry, frequent LOC assessments, frequent vital sign checks and cardiac monitor   Intra-procedure events: none     Total Provider sedation time (minutes):  26 Post-procedure details:    Post-sedation assessment completed:  01/27/2021 12:53 PM   Recovery: Patient returned to pre-procedure baseline     Post-sedation assessments completed and reviewed: airway patency, cardiovascular function, hydration status, mental status and respiratory function     Post-sedation assessments completed and reviewed: post-procedure nausea and vomiting status not reviewed and pain score not reviewed     Procedure completion:  Tolerated well, no immediate complications Reduction of dislocation  Date/Time: 01/27/2021 12:55 PM Performed by: Daleen Bo, MD Authorized by: Daleen Bo, MD  Consent: Verbal consent obtained. Written consent obtained. Risks and benefits: risks, benefits and alternatives were discussed Consent given by: patient Patient understanding: patient states understanding of the procedure being performed Patient identity confirmed: verbally with patient Time out: Immediately prior to procedure a "time out" was called to verify the correct patient, procedure, equipment, support staff and site/side marked as required. Local anesthesia used: no  Anesthesia: Local anesthesia used: no  Sedation: Patient sedated: yes Sedatives: ketamine and propofol Analgesia: fentanyl Sedation start date/time: 01/27/2021 12:25 PM Sedation end date/time: 01/27/2021 12:56 PM Vitals: Vital signs were monitored during  sedation.  Patient tolerance: patient tolerated the procedure well with no immediate complications Comments: Right hip dislocation reduced with combination maneuvers, to distract the joint and rotate it into place.      Medications Ordered in ED Medications  ketamine 50 mg in normal saline 5 mL (10 mg/mL) syringe ( Intravenous Canceled Entry 01/27/21 1324)  propofol (DIPRIVAN) 10 mg/mL bolus/IV push 44 mg ( Intravenous Canceled Entry 01/27/21 1325)  ketamine HCl 50 MG/5ML SOSY (  Canceled Entry 01/27/21 1324)  fentaNYL (SUBLIMAZE) injection 100 mcg (100 mcg Intravenous Given 01/27/21 1104)  ketamine (KETALAR) injection (10 mg Intravenous Given 01/27/21 1238)  propofol (DIPRIVAN) 10 mg/mL bolus/IV push (10 mg Intravenous Given 01/27/21 1238)    ED Course  I have reviewed the triage vital signs and the nursing notes.  Pertinent labs & imaging results that were available during my care of the patient were reviewed by me and considered in my medical decision making (see chart for details).    MDM Rules/Calculators/A&P                           Patient Vitals for the past 24 hrs:  BP Temp Temp src Pulse Resp SpO2  01/27/21 1400 (!) 152/72 -- -- (!) 143 (!) 22 95 %  01/27/21 1345 (!) 156/65 -- -- (!) 37 15 100 %  01/27/21 1330 (!) 175/55 -- -- (!) 39 14 100 %  01/27/21 1315 (!) 159/54 -- -- 73 14 100 %  01/27/21 1300 (!) 172/46 -- -- 71 18 98 %  01/27/21 1245 (!) 189/77 -- -- 73 (!) 23 98 %  01/27/21 1240 (!) 199/74 -- -- (!) 49 Marland Kitchen)  25 98 %  01/27/21 1235 (!) 168/83 -- -- 60 16 100 %  01/27/21 1234 (!) 165/118 -- -- 72 (!) 25 (!) 48 %  01/27/21 1232 (!) 175/71 -- -- (!) 52 20 97 %  01/27/21 1230 (!) 161/66 -- -- (!) 45 14 95 %  01/27/21 1215 (!) 163/114 -- -- (!) 50 12 100 %  01/27/21 1209 (!) 151/135 -- -- (!) 45 14 100 %  01/27/21 1030 (!) 190/82 -- -- (!) 57 16 100 %  01/27/21 1018 (!) 183/59 97.6 F (36.4 C) Oral (!) 40 15 100 %  01/27/21 1010 -- -- -- -- -- 100 %    2:37 PM  Reevaluation with update and discussion. After initial assessment and treatment, an updated evaluation reveals she is comfortable has no further complaints.  Findings discussed and questions answered. Daleen Bo   Medical Decision Making:  This patient is presenting for evaluation of right hip dislocation, which does require a range of treatment options, and is a complaint that involves a high risk of morbidity and mortality. The differential diagnoses include fracture, dislocation, complications from prior surgery. I decided to review old records, and in summary elderly female, with right hip pain, which occurred while sitting and bending forward.  I did not require additional historical information from anyone.   Radiologic Tests Ordered, included pelvis and right hip x-rays.  I independently Visualized: Radiographic images, which show initial radiography consistent with superior right hip dislocation.  Post reduction imaging shows relocated right hip prosthesis.  Cardiac Monitor Tracing which shows sinus bradycardia    Critical Interventions-clinical evaluation, medication treatment, radiography, observation, reduction of right hip dislocation, observation reassessment  After These Interventions, the Patient was reevaluated and was found stable for discharge.  Knee immobilizer placed on right knee, following reduction of hip, by me.  Patient comfortable afterwards and stable for discharge.  She was able to ambulate using her own mobility and wearing the knee brace.  CRITICAL CARE-no Performed by: Daleen Bo  Nursing Notes Reviewed/ Care Coordinated Applicable Imaging Reviewed Interpretation of Laboratory Data incorporated into ED treatment  The patient appears reasonably screened and/or stabilized for discharge and I doubt any other medical condition or other [facility] requiring further screening, evaluation, or treatment in the ED at this time prior to discharge.  Plan: Home  Medications-continue usual; Home Treatments-rest, fluids, knee immobilizer until follow-up with orthopedics; return here if the recommended treatment, does not improve the symptoms; Recommended follow up-orthopedic follow-up 3 to 5 days.     Final Clinical Impression(s) / ED Diagnoses Final diagnoses:  Dislocation, hip, anterior, right, initial encounter [facility])    Rx / DC Orders ED Discharge Orders    None       Daleen Bo, MD 01/27/21 2030

## 2021-01-27 NOTE — ED Notes (Signed)
Ortho Tech & RT called in preparation for procedural sedation.

## 2021-01-27 NOTE — ED Notes (Signed)
Patient transported to X-ray 

## 2021-01-27 NOTE — ED Notes (Signed)
Ketamine/Propofol mix undocumented waste in pyxis. As documented prior by Paden, RN, this RN witnessed the waste of this mixture of meds in sharps.

## 2021-01-27 NOTE — Progress Notes (Signed)
Orthopedic Tech Progress Note Patient Details:  [name] [facility] 1945/12/09 [id number] MD did a reduction of the hip and once finished I applied a KNEE IMMOBILIZER to her Ortho Devices Type of Ortho Device: Knee Immobilizer Ortho Device/Splint Location: RLE Ortho Device/Splint Interventions: Application,Adjustment   Post Interventions Patient Tolerated: Well Instructions Provided: Care of device   Janit Pagan 01/27/2021, 1:19 PM

## 2021-01-27 NOTE — ED Notes (Signed)
Pt ambulating in hall at this time using walker, per her baseline. Pt reports no pain with standing or walking.

## 2021-01-27 NOTE — ED Notes (Signed)
Paged Cardio/Stemi per Dr. Beatriz Chancellor instructions

## 2021-01-27 NOTE — ED Notes (Signed)
Ketamine/Propofol mix undocumented waste in pyxis. Administered during sedation and reduction per EDP Wentz. 52ml of remain mix was wasted in sharps after sedation and witness by Barbie Banner, RN.

## 2021-01-27 NOTE — Discharge Instructions (Signed)
Your hip was reduced, after it was dislocated this morning.  Keep the knee brace on, to keep your knee straight, which will help avoid the hip from popping out again.  If you have to remove the knee immobilizer to change or bathe, keep your knee straight at all times.  For pain, use Tylenol.  Call your orthopedic doctor for follow-up appointment to be seen as soon as possible for further care and treatment.

## 2021-01-27 NOTE — ED Notes (Signed)
Pt has contacted her brother & he is en route to ED to pick her up for D/C.

## 2021-02-03 ENCOUNTER — Ambulatory Visit: Payer: Medicare HMO | Admitting: Podiatry

## 2021-02-03 DIAGNOSIS — M25551 Pain in right hip: Secondary | ICD-10-CM | POA: Diagnosis not present

## 2021-02-12 DIAGNOSIS — G4733 Obstructive sleep apnea (adult) (pediatric): Secondary | ICD-10-CM | POA: Diagnosis not present

## 2021-02-12 DIAGNOSIS — M169 Osteoarthritis of hip, unspecified: Secondary | ICD-10-CM | POA: Diagnosis not present

## 2021-02-17 ENCOUNTER — Ambulatory Visit (AMBULATORY_SURGERY_CENTER): Payer: Self-pay | Admitting: *Deleted

## 2021-02-17 ENCOUNTER — Other Ambulatory Visit: Payer: Self-pay

## 2021-02-17 VITALS — Ht 64.0 in | Wt 195.0 lb

## 2021-02-17 DIAGNOSIS — Z1211 Encounter for screening for malignant neoplasm of colon: Secondary | ICD-10-CM

## 2021-02-17 MED ORDER — NA SULFATE-K SULFATE-MG SULF 17.5-3.13-1.6 GM/177ML PO SOLN: ORAL | 0 refills | Status: DC

## 2021-02-17 NOTE — Progress Notes (Signed)
Patient is here in-person for PV. Patient denies any allergies to eggs or soy. Patient denies any problems with anesthesia/sedation. Patient denies any oxygen use at home. Patient denies taking any diet/weight loss medications or blood thinners. Patient is not being treated for MRSA or C-diff. Patient is aware of our care-partner policy and [id number] safety protocol.  Patient states she is able to lay on left side. Patient is COVID-19 vaccinated, per patient.

## 2021-02-19 ENCOUNTER — Other Ambulatory Visit: Payer: Self-pay

## 2021-02-19 ENCOUNTER — Encounter: Payer: Self-pay | Admitting: Obstetrics and Gynecology

## 2021-02-19 ENCOUNTER — Ambulatory Visit: Payer: Medicare HMO | Admitting: Obstetrics and Gynecology

## 2021-02-19 VITALS — BP 122/68 | HR 49 | Ht 63.25 in | Wt 191.0 lb

## 2021-02-19 DIAGNOSIS — N3946 Mixed incontinence: Secondary | ICD-10-CM | POA: Diagnosis not present

## 2021-02-19 DIAGNOSIS — N814 Uterovaginal prolapse, unspecified: Secondary | ICD-10-CM | POA: Diagnosis not present

## 2021-02-19 DIAGNOSIS — Z01411 Encounter for gynecological examination (general) (routine) with abnormal findings: Secondary | ICD-10-CM

## 2021-02-19 DIAGNOSIS — N8111 Cystocele, midline: Secondary | ICD-10-CM

## 2021-02-19 NOTE — Patient Instructions (Signed)

## 2021-02-19 NOTE — Progress Notes (Signed)
75 y.o. [id number] Legally Separated White or Caucasian Not Hispanic or Latino female here for annual exam.   She has been dealing with worsening uterine prolapse, will have a pessary fitting today.  No vaginal bleeding.  Has mixed incontinence. Negative urine culture last month.     Needs a left knee replacement, has a consultation in 6/22.   No LMP recorded. Patient is postmenopausal.          Sexually active: No.  The current method of family planning is post menopausal status.    Exercising: walking and stretching Smoker:  no  Health Maintenance: Pap:  11/30/16 WNL Hr HPV Neg  History of abnormal Pap:  no MMG:  07/02/20 Bi-rads 1 neg  BMD:   06/27/19 osteopenia, doing it with her primary. Scheduled in the fall.   Colonoscopy: 2009, she has an appointment in 2 weeks.  TDaP:  Marena Chancy thinks she is due Gardasil: NA   reports that she quit smoking about 46 years ago. Her smoking use included cigarettes. She has a 39.00 pack-year smoking history. She has never used smokeless tobacco. She reports that she does not drink alcohol and does not use drugs. 3 daughters, 8 grandchildren, 5 great grandchildren. 2 daughters are local, youngest daughter in [place].   Past Medical History:  Diagnosis Date  . Allergy   . Arthritis   . Asthma    mild  . Blood transfusion without reported diagnosis   . H/O bronchitis   . Hemorrhoid   . Hx of blood clots 1967   from birth control in LUE  . Hypertension   . Hypoglycemia   . Hypothyroidism   . Migraine    from food  . Osteopenia 07/04/2019  . Pneumonia    hx of  . Seasonal allergies   . Sleep apnea    CPAP  . UTI (urinary tract infection)     Past Surgical History:  Procedure Laterality Date  . CATARACT EXTRACTION     rt eye  . COLONOSCOPY  05/13/2008   Ardis Hughs  . detatched retina  2003   rt eye  . 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   Procedure: RIGHT TOTAL HIP ARTHROPLASTY;  Surgeon: Tobi Bastos, MD;  Location: [facility];  Service:  Orthopedics;  Laterality: Right;    Current Outpatient Medications  Medication Sig Dispense Refill  . acetaminophen (TYLENOL) 500 MG tablet Take 500-1,000 mg by mouth as needed for mild pain or moderate pain.    Marland Kitchen albuterol (PROAIR HFA) 108 (90 Base) MCG/ACT inhaler Inhale 2 puffs into the lungs every 6 (six) hours as needed for wheezing or shortness of breath. 1 each 0  . B Complex-C (B-COMPLEX WITH VITAMIN C) tablet Take 1 tablet by mouth daily.    . Cholecalciferol (VITAMIN D) 2000 UNITS CAPS Take 2,000 Units by mouth 2 (two) times daily.     . fexofenadine (ALLEGRA) 60 MG tablet Take 60 mg by mouth daily.    . Lactobacillus-Inulin (CULTURELLE DIGESTIVE DAILY PO) Take by mouth.    . levothyroxine (SYNTHROID) 50 MCG tablet Take 1 tablet (50 mcg total) by mouth daily. 90 tablet 3  . lisinopril (ZESTRIL) 20 MG tablet Take 1 tablet (20 mg total) by mouth at bedtime. 90 tablet 1  . nystatin (MYCOSTATIN/NYSTOP) powder Apply topically 2 (two) times daily as needed. Beneath both breasts 15 g 0  . triamcinolone ointment (KENALOG) 0.1 % Apply once daily to hands as needed for hand eczema only. 30 g 2  .  Na Sulfate-K Sulfate-Mg Sulf 17.5-3.13-1.6 GM/177ML SOLN Suprep (no substitutions)-TAKE AS DIRECTED. (Patient not taking: Reported on 02/19/2021) 354 mL 0   No current facility-administered medications for this visit.    Family History  Problem Relation Age of Onset  . Thyroid disease Mother   . Osteoarthritis Mother   . Thyroid disease Father   . Osteoarthritis Father   . Osteoarthritis Maternal Grandmother   . Colon cancer Maternal Grandmother 32  . Osteoarthritis Maternal Grandfather   . Osteoarthritis Paternal Grandmother   . Esophageal cancer Neg Hx   . Rectal cancer Neg Hx   . Stomach cancer Neg Hx   . Colon polyps Neg Hx     Review of Systems  All other systems reviewed and are negative.   Exam:   BP 122/68   Pulse (!) 49   Ht 5' 3.25" (1.607 m)   Wt 191 lb (86.6 kg)   SpO2  99%   BMI 33.57 kg/m   Weight change: @WEIGHTCHANGE @ Height:   Height: 5' 3.25" (160.7 cm)  Ht Readings from Last 3 Encounters:  02/19/21 5' 3.25" (1.607 m)  02/17/21 5\' 4"  (1.626 m)  01/16/21 5\' 4"  (1.626 m)    General appearance: alert, cooperative and appears stated age Head: Normocephalic, without obvious abnormality, atraumatic Neck: no adenopathy, supple, symmetrical, trachea midline and thyroid normal to inspection and palpation Breasts: normal appearance, no masses or tenderness Abdomen: soft, non-tender; non distended,  no masses,  no organomegaly Extremities: extremities normal, atraumatic, no cyanosis or edema Skin: Skin color, texture, turgor normal. No rashes or lesions Lymph nodes: Cervical, supraclavicular, and axillary nodes normal. No abnormal inguinal nodes palpated Neurologic: Grossly normal   Pelvic: External genitalia:  no lesions              Urethra:  normal appearing urethra with no masses, tenderness or lesions              Bartholins and Skenes: normal                 Vagina: normal appearing vagina with normal color and discharge, grade 1-2 cystocele  The patient was fitted with a #4 ring pessary with support.               Cervix: no lesions and cervix is prolapsing 1+ cm outside of the introitus with valsalva               Bimanual Exam:  Uterus:  normal size, contour, position, consistency, mobility, non-tender              Adnexa: no mass, fullness, tenderness               Rectovaginal: Confirms               Anus:  normal sphincter tone, no lesions  Gae Dry chaperoned for the exam.  1. Encounter for gynecological examination with abnormal finding Uterine prolapse Discussed breast self exam Discussed calcium and vit D intake  2. Uterine prolapse Much more pronounced today, grade 2 uterine prolapse Fitted with a #4 ring pessary with support  3. Midline cystocele Grade 1-2 cystocele  4. Mixed incontinence Negative urine culture If her  symptoms don't improve with the pessary, will further discuss treatment options.

## 2021-02-20 ENCOUNTER — Telehealth: Payer: Self-pay | Admitting: Gastroenterology

## 2021-02-20 NOTE — Telephone Encounter (Signed)
Pt states Suprep - $100 Pt and I discussed prep options-  She decided on Miralax prep- I will mail new miralax prep instructions to pt- she verified address today

## 2021-02-20 NOTE — Telephone Encounter (Signed)
Patient called said the Sulfate prep medication is too expensive and is requesting an alternative.

## 2021-02-23 ENCOUNTER — Encounter: Payer: Self-pay | Admitting: Obstetrics and Gynecology

## 2021-02-23 ENCOUNTER — Ambulatory Visit: Payer: Medicare HMO | Admitting: Obstetrics and Gynecology

## 2021-02-23 ENCOUNTER — Other Ambulatory Visit: Payer: Self-pay

## 2021-02-23 VITALS — BP 132/80 | HR 70 | Ht 63.25 in | Wt 192.0 lb

## 2021-02-23 DIAGNOSIS — N8111 Cystocele, midline: Secondary | ICD-10-CM

## 2021-02-23 DIAGNOSIS — Z4689 Encounter for fitting and adjustment of other specified devices: Secondary | ICD-10-CM

## 2021-02-23 DIAGNOSIS — N814 Uterovaginal prolapse, unspecified: Secondary | ICD-10-CM

## 2021-02-23 NOTE — Progress Notes (Signed)
GYNECOLOGY  VISIT   HPI: 75 y.o.   Legally Separated White or Caucasian Not Hispanic or Latino  female   [phone] with No LMP recorded. Patient is postmenopausal.   here for pessary check. Last week she was fitted with a #4 ring pessary with support for a grade 2 uterine prolapse and grade 1-2 cystocele.  She has noticed an improvement with her urge incontinence. She is comfortable with the pessary, can't feel it. No vaginal bleeding.   Not sexually active.   GYNECOLOGIC HISTORY: No LMP recorded. Patient is postmenopausal. Contraception: Postmenopausal. Menopausal hormone therapy: none.        OB History    Gravida  3   Para  3   Term  3   Preterm      AB      Living  3     SAB      IAB      Ectopic      Multiple      Live Births  3              Patient Active Problem List   Diagnosis Date Noted  . Uterine prolapse 12/09/2020  . Urge incontinence of urine 12/09/2020  . Chronic left shoulder pain 12/09/2020  . 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  . Pain due to onychomycosis of toenails of both feet 04/22/2020  . History of hypoglycemia 12/25/2019  . Onychomycosis 12/25/2019  . Abnormal serum cholesterol 12/25/2019  . Osteopenia 07/04/2019  . Obesity (BMI 30-39.9) 12/22/2017  . Vitamin D deficiency 05/18/2016  . Constipation 09/25/2013  . Acute blood loss anemia 09/20/2013  . Osteoarthritis of right hip 09/19/2013  . 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 Heel pain 02/16/2013  . Knee pain 07/05/2012  . Bilateral bunions 07/05/2012  . Screening for malignant neoplasm of the cervix 07/05/2012  . Healthcare maintenance 07/05/2012  . Sinusitis 02/18/2012  . Anxiety and depression 11/02/2011  . OSA on CPAP 11/02/2011  . Asthma 08/12/2010  . PANIC DISORDER 05/07/2009  . Essential hypertension 12/05/2008  . Hypothyroidism 03/27/2008  . ARTHRITIS 03/27/2008  . Allergic rhinitis 08/26/2007    Past Medical History:  Diagnosis Date  . Allergy   .  Arthritis   . Asthma    mild  . Blood transfusion without reported diagnosis   . H/O bronchitis   . Hemorrhoid   . Hx of blood clots 1967   from birth control in LUE  . Hypertension   . Hypoglycemia   . Hypothyroidism   . Migraine    from food  . Osteopenia 07/04/2019  . Pneumonia    hx of  . Seasonal allergies   . Sleep apnea    CPAP  . UTI (urinary tract infection)     Past Surgical History:  Procedure Laterality Date  . CATARACT EXTRACTION     rt eye  . COLONOSCOPY  05/13/2008   Ardis Hughs  . detatched retina  2003   rt eye  . 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   Procedure: RIGHT TOTAL HIP ARTHROPLASTY;  Surgeon: Tobi Bastos, MD;  Location: [facility];  Service: Orthopedics;  Laterality: Right;    Current Outpatient Medications  Medication Sig Dispense Refill  . acetaminophen (TYLENOL) 500 MG tablet Take 500-1,000 mg by mouth as needed for mild pain or moderate pain.    Marland Kitchen albuterol (PROAIR HFA) 108 (90 Base) MCG/ACT inhaler Inhale 2 puffs into the lungs every 6 (six) hours as needed for wheezing or  shortness of breath. 1 each 0  . B Complex-C (B-COMPLEX WITH VITAMIN C) tablet Take 1 tablet by mouth daily.    . Cholecalciferol (VITAMIN D) 2000 UNITS CAPS Take 2,000 Units by mouth 2 (two) times daily.     . fexofenadine (ALLEGRA) 60 MG tablet Take 60 mg by mouth daily.    . Lactobacillus-Inulin (CULTURELLE DIGESTIVE DAILY PO) Take by mouth.    . levothyroxine (SYNTHROID) 50 MCG tablet Take 1 tablet (50 mcg total) by mouth daily. 90 tablet 3  . lisinopril (ZESTRIL) 20 MG tablet Take 1 tablet (20 mg total) by mouth at bedtime. 90 tablet 1  . Na Sulfate-K Sulfate-Mg Sulf 17.5-3.13-1.6 GM/177ML SOLN Suprep (no substitutions)-TAKE AS DIRECTED. 354 mL 0  . nystatin (MYCOSTATIN/NYSTOP) powder Apply topically 2 (two) times daily as needed. Beneath both breasts 15 g 0  . triamcinolone ointment (KENALOG) 0.1 % Apply once daily to hands as needed for hand eczema only. 30 g 2    No current facility-administered medications for this visit.     ALLERGIES: Codeine, Pork-derived products, and Prednisone  Family History  Problem Relation Age of Onset  . Thyroid disease Mother   . Osteoarthritis Mother   . Thyroid disease Father   . Osteoarthritis Father   . Osteoarthritis Maternal Grandmother   . Colon cancer Maternal Grandmother 24  . Osteoarthritis Maternal Grandfather   . Osteoarthritis Paternal Grandmother   . Esophageal cancer Neg Hx   . Rectal cancer Neg Hx   . Stomach cancer Neg Hx   . Colon polyps Neg Hx     Social History   Socioeconomic History  . Marital status: Legally Separated    Spouse name: Not on file  . Number of children: Not on file  . Years of education: Not on file  . Highest education level: Not on file  Occupational History  . Not on file  Tobacco Use  . Smoking status: Former Smoker    Packs/day: 3.00    Years: 13.00    Pack years: 39.00    Types: Cigarettes    Quit date: 10/11/1974    Years since quitting: 46.4  . Smokeless tobacco: Never Used  Vaping Use  . Vaping Use: Never used  Substance and Sexual Activity  . Alcohol use: No    Alcohol/week: 0.0 standard drinks  . Drug use: No  . Sexual activity: Not Currently  Other Topics Concern  . Not on file  Social History Narrative  . Not on file   Social Determinants of Health   Financial Resource Strain: Low Risk   . Difficulty of Paying Living Expenses: Not hard at all  Food Insecurity: No Food Insecurity  . Worried About Charity fundraiser in the Last Year: Never true  . Ran Out of Food in the Last Year: Never true  Transportation Needs: No Transportation Needs  . Lack of Transportation (Medical): No  . Lack of Transportation (Non-Medical): No  Physical Activity: Inactive  . Days of Exercise per Week: 0 days  . Minutes of Exercise per Session: 0 min  Stress: No Stress Concern Present  . Feeling of Stress : Not at all  Social Connections: Moderately  Isolated  . Frequency of Communication with Friends and Family: More than three times a week  . Frequency of Social Gatherings with Friends and Family: More than three times a week  . Attends Religious Services: 1 to 4 times per year  . Active Member of Clubs or Organizations: No  .  Attends Archivist Meetings: Never  . Marital Status: Separated  Intimate Partner Violence: Not At Risk  . Fear of Current or Ex-Partner: No  . Emotionally Abused: No  . Physically Abused: No  . Sexually Abused: No    Review of Systems  Constitutional: Negative.   HENT: Negative.   Eyes: Negative.   Respiratory: Negative.   Cardiovascular: Negative.   Gastrointestinal: Negative.   Genitourinary: Negative.   Musculoskeletal: Negative.   Skin: Negative.   Neurological: Negative.   Endo/Heme/Allergies: Negative.   Psychiatric/Behavioral: Negative.     PHYSICAL EXAMINATION:    BP 132/80 (BP Location: Right Arm, Patient Position: Sitting, Cuff Size: Normal)   Pulse 70   Ht 5' 3.25" (1.607 m)   Wt 192 lb (87.1 kg)   SpO2 99%   BMI 33.74 kg/m     General appearance: alert, cooperative and appears stated age  Pelvic: External genitalia:  no lesions              Urethra:  normal appearing urethra with no masses, tenderness or lesions              Bartholins and Skenes: normal                 Vagina: the pessary was removed and cleaned. No vaginal irritation. The pessary was replaced.              Cervix: no lesions                Chaperone was present for exam.  1. Pessary maintenance Doing well F/u in one month

## 2021-02-25 ENCOUNTER — Encounter: Payer: Self-pay | Admitting: Obstetrics and Gynecology

## 2021-02-26 ENCOUNTER — Encounter: Payer: Self-pay | Admitting: Gastroenterology

## 2021-02-28 ENCOUNTER — Other Ambulatory Visit: Payer: Self-pay | Admitting: Family Medicine

## 2021-02-28 DIAGNOSIS — I10 Essential (primary) hypertension: Secondary | ICD-10-CM

## 2021-03-03 ENCOUNTER — Encounter: Payer: Self-pay | Admitting: Gastroenterology

## 2021-03-03 ENCOUNTER — Ambulatory Visit (AMBULATORY_SURGERY_CENTER): Payer: Medicare HMO | Admitting: Gastroenterology

## 2021-03-03 ENCOUNTER — Other Ambulatory Visit: Payer: Self-pay

## 2021-03-03 VITALS — BP 129/59 | HR 42 | Temp 96.2°F | Resp 28 | Ht 64.0 in | Wt 195.0 lb

## 2021-03-03 DIAGNOSIS — Z1211 Encounter for screening for malignant neoplasm of colon: Secondary | ICD-10-CM | POA: Diagnosis not present

## 2021-03-03 DIAGNOSIS — D120 Benign neoplasm of cecum: Secondary | ICD-10-CM | POA: Diagnosis not present

## 2021-03-03 DIAGNOSIS — D122 Benign neoplasm of ascending colon: Secondary | ICD-10-CM | POA: Diagnosis not present

## 2021-03-03 MED ORDER — SODIUM CHLORIDE 0.9 % IV SOLN
500.0000 mL | Freq: Once | INTRAVENOUS | Status: DC
Start: 2021-03-03 — End: 2021-03-03

## 2021-03-03 NOTE — Progress Notes (Signed)
Pt's states no medical or surgical changes since previsit or office visit. 

## 2021-03-03 NOTE — Progress Notes (Signed)
Called to room to assist during endoscopic procedure.  Patient ID and intended procedure confirmed with present staff. Received instructions for my participation in the procedure from the performing physician.  

## 2021-03-03 NOTE — Patient Instructions (Signed)
Please read handouts provided. Continue present medications. Await pathology results.   YOU HAD AN ENDOSCOPIC PROCEDURE TODAY AT THE [facility]:   Refer to the procedure report that was given to you for any specific questions about what was found during the examination.  If the procedure report does not answer your questions, please call your gastroenterologist to clarify.  If you requested that your care partner not be given the details of your procedure findings, then the procedure report has been included in a sealed envelope for you to review at your convenience later.  YOU SHOULD EXPECT: Some feelings of bloating in the abdomen. Passage of more gas than usual.  Walking can help get rid of the air that was put into your GI tract during the procedure and reduce the bloating. If you had a lower endoscopy (such as a colonoscopy or flexible sigmoidoscopy) you may notice spotting of blood in your stool or on the toilet paper. If you underwent a bowel prep for your procedure, you may not have a normal bowel movement for a few days.  Please Note:  You might notice some irritation and congestion in your nose or some drainage.  This is from the oxygen used during your procedure.  There is no need for concern and it should clear up in a day or so.  SYMPTOMS TO REPORT IMMEDIATELY:  Following lower endoscopy (colonoscopy or flexible sigmoidoscopy):  Excessive amounts of blood in the stool  Significant tenderness or worsening of abdominal pains  Swelling of the abdomen that is new, acute  Fever of 100F or higher   For urgent or emergent issues, a gastroenterologist can be reached at any hour by calling [phone]. Do not use MyChart messaging for urgent concerns.    DIET:  We do recommend a small meal at first, but then you may proceed to your regular diet.  Drink plenty of fluids but you should avoid alcoholic beverages for 24 hours.  ACTIVITY:  You should plan to take it easy  for the rest of today and you should NOT DRIVE or use heavy machinery until tomorrow (because of the sedation medicines used during the test).    FOLLOW UP: Our staff will call the number listed on your records 48-72 hours following your procedure to check on you and address any questions or concerns that you may have regarding the information given to you following your procedure. If we do not reach you, we will leave a message.  We will attempt to reach you two times.  During this call, we will ask if you have developed any symptoms of COVID 19. If you develop any symptoms (ie: fever, flu-like symptoms, shortness of breath, cough etc.) before then, please call [phone].  If you test positive for Covid 19 in the 2 weeks post procedure, please call and report this information to us.    If any biopsies were taken you will be contacted by phone or by letter within the next 1-3 weeks.  Please call us at [phone] if you have not heard about the biopsies in 3 weeks.    SIGNATURES/CONFIDENTIALITY: You and/or your care partner have signed paperwork which will be entered into your electronic medical record.  These signatures attest to the fact that that the information above on your After Visit Summary has been reviewed and is understood.  Full responsibility of the confidentiality of this discharge information lies with you and/or your care-partner.  

## 2021-03-03 NOTE — Op Note (Signed)
[facility] Patient Name: [name] Procedure Date: 03/03/2021 11:10 AM MRN: [id number] Endoscopist: Milus Banister , MD Age: 75 Referring MD:  Date of Birth: [birth year] Gender: Female Account #: [id number] Procedure:                Colonoscopy Indications:              Screening for colorectal malignant neoplasm Medicines:                Monitored Anesthesia Care Procedure:                Pre-Anesthesia Assessment:                           - Prior to the procedure, a History and Physical                            was performed, and patient medications and                            allergies were reviewed. The patient's tolerance of                            previous anesthesia was also reviewed. The risks                            and benefits of the procedure and the sedation                            options and risks were discussed with the patient.                            All questions were answered, and informed consent                            was obtained. Prior Anticoagulants: The patient has                            taken no previous anticoagulant or antiplatelet                            agents. ASA Grade Assessment: II - A patient with                            mild systemic disease. After reviewing the risks                            and benefits, the patient was deemed in                            satisfactory condition to undergo the procedure.                           After obtaining informed consent, the colonoscope  was passed under direct vision. Throughout the                            procedure, the patient's blood pressure, pulse, and                            oxygen saturations were monitored continuously. The                            Olympus CF-[id number]) Colonoscope was                            introduced through the anus and advanced to the the                            cecum,  identified by appendiceal orifice and                            ileocecal valve. The colonoscopy was performed                            without difficulty. The patient tolerated the                            procedure well. The quality of the bowel                            preparation was good. The ileocecal valve,                            appendiceal orifice, and rectum were photographed. Scope In: 11:34:31 AM Scope Out: 11:47:29 AM Scope Withdrawal Time: 0 hours 9 minutes 5 seconds  Total Procedure Duration: 0 hours 12 minutes 58 seconds  Findings:                 Two sessile polyps were found in the ascending                            colon and cecum. The polyps were 2 to 12 mm in                            size. These polyps were removed with a cold snare.                            Resection and retrieval were complete.                           Multiple small and large-mouthed diverticula were                            found in the left colon.                           External and internal hemorrhoids were found. The  hemorrhoids were small.                           The exam was otherwise without abnormality on                            direct and retroflexion views. Complications:            No immediate complications. Estimated blood loss:                            None. Estimated Blood Loss:     Estimated blood loss: none. Impression:               - Two 2 to 12 mm polyps in the ascending colon and                            in the cecum, removed with a cold snare. Resected                            and retrieved.                           - Diverticulosis in the left colon.                           - External and internal hemorrhoids.                           - The examination was otherwise normal on direct                            and retroflexion views. Recommendation:           - Patient has a contact number available for                             emergencies. The signs and symptoms of potential                            delayed complications were discussed with the                            patient. Return to normal activities tomorrow.                            Written discharge instructions were provided to the                            patient.                           - Resume previous diet.                           - Continue present medications.                           -  Await pathology results. Milus Banister, MD 03/03/2021 11:49:53 AM This report has been signed electronically.

## 2021-03-03 NOTE — Progress Notes (Signed)
A and O x3. Report to RN. Tolerated MAC anesthesia well.

## 2021-03-05 ENCOUNTER — Telehealth: Payer: Self-pay

## 2021-03-05 ENCOUNTER — Telehealth: Payer: Self-pay | Admitting: *Deleted

## 2021-03-05 NOTE — Telephone Encounter (Signed)
  Follow up Call-  Call back number 03/03/2021  Post procedure Call Back phone  # [phone]  Permission to leave phone message Yes  Some recent data might be hidden     Patient questions:  Message left to call us if necessary.

## 2021-03-05 NOTE — Telephone Encounter (Signed)
  Follow up Call-  Call back number 03/03/2021  Post procedure Call Back phone  # [phone]  Permission to leave phone message Yes  Some recent data might be hidden     Patient questions:  Do you have a fever, pain , or abdominal swelling? No. Pain Score  0 *  Have you tolerated food without any problems? Yes.    Have you been able to return to your normal activities? Yes.    Do you have any questions about your discharge instructions: Diet   No. Medications  No. Follow up visit  No.  Do you have questions or concerns about your Care? No.  Actions: * If pain score is 4 or above: No action needed, pain <4.  1. Have you developed a fever since your procedure? no  2.   Have you had an respiratory symptoms (SOB or cough) since your procedure?no  3.   Have you tested positive for COVID 19 since your procedure no  4.   Have you had any family members/close contacts diagnosed with the COVID 19 since your procedure? no   If yes to any of these questions please route to Joylene John, RN and Joella Prince, RN

## 2021-03-10 ENCOUNTER — Ambulatory Visit: Payer: Medicare HMO | Admitting: Podiatry

## 2021-03-10 ENCOUNTER — Other Ambulatory Visit: Payer: Self-pay

## 2021-03-10 ENCOUNTER — Encounter: Payer: Self-pay | Admitting: Podiatry

## 2021-03-10 DIAGNOSIS — B351 Tinea unguium: Secondary | ICD-10-CM

## 2021-03-10 DIAGNOSIS — M79675 Pain in left toe(s): Secondary | ICD-10-CM | POA: Diagnosis not present

## 2021-03-10 DIAGNOSIS — M79674 Pain in right toe(s): Secondary | ICD-10-CM

## 2021-03-10 NOTE — Progress Notes (Signed)
This patient returns to the office for evaluation and treatment of long thick painful nails .  This patient is unable to trim her own nails since the patient cannot reach her feet.  Patient says the nails are painful walking and wearing his shoes.  She returns for preventive foot care services.  General Appearance  Alert, conversant and in no acute stress.  Vascular  Dorsalis pedis and posterior tibial  pulses are palpable  bilaterally.  Capillary return is within normal limits  bilaterally. Temperature is within normal limits  bilaterally.  Neurologic  Senn-Weinstein monofilament wire test within normal limits  bilaterally. Muscle power within normal limits bilaterally.  Nails Thick disfigured discolored nails with subungual debris  from hallux to fifth toes bilaterally. No evidence of bacterial infection or drainage bilaterally.  Orthopedic  No limitations of motion  feet .  No crepitus or effusions noted.  No bony pathology or digital deformities noted.  HAV  B/L.  Skin  normotropic skin with no porokeratosis noted bilaterally.  No signs of infections or ulcers noted.   Asymptomatic callus sub 5th right foot.  Onychomycosis  Pain in toes right foot  Pain in toes left foot  Debridement  of nails  1-5  B/L with a nail nipper.  Nails were then filed using a dremel tool with no incidents.    RTC 3 months    Gardiner Barefoot DPM

## 2021-03-11 ENCOUNTER — Encounter: Payer: Self-pay | Admitting: Family Medicine

## 2021-03-11 ENCOUNTER — Ambulatory Visit (INDEPENDENT_AMBULATORY_CARE_PROVIDER_SITE_OTHER): Payer: Medicare HMO | Admitting: Family Medicine

## 2021-03-11 VITALS — BP 134/68 | HR 64 | Temp 97.7°F | Ht 64.0 in | Wt 194.4 lb

## 2021-03-11 DIAGNOSIS — E038 Other specified hypothyroidism: Secondary | ICD-10-CM | POA: Diagnosis not present

## 2021-03-11 DIAGNOSIS — E039 Hypothyroidism, unspecified: Secondary | ICD-10-CM

## 2021-03-11 DIAGNOSIS — E559 Vitamin D deficiency, unspecified: Secondary | ICD-10-CM

## 2021-03-11 DIAGNOSIS — I10 Essential (primary) hypertension: Secondary | ICD-10-CM | POA: Diagnosis not present

## 2021-03-11 DIAGNOSIS — Z0000 Encounter for general adult medical examination without abnormal findings: Secondary | ICD-10-CM

## 2021-03-11 MED ORDER — LEVOTHYROXINE SODIUM 50 MCG PO TABS
50.0000 ug | ORAL_TABLET | Freq: Every day | ORAL | 3 refills | Status: DC
Start: 2021-03-11 — End: 2021-09-17

## 2021-03-11 NOTE — Progress Notes (Signed)
Established Patient Office Visit  Subjective:  Patient ID: [name], female    DOB: October 30, 1945  Age: 75 y.o. MRN: [id number]  CC:  Chief Complaint  Patient presents with  . Follow-up    Follow up on BP, patient not fasting.     HPI [name] presents for follow-up of hypertension, hypothyroidism and vitamin D deficiency.  Adequate vitamin D levels are maintained with her current dose of vitamin D3.  Able to take calcium supplementation secondary to constipation.  She is mindful of the calcium in her diet and is able to tolerate that.  Continues levothyroxine each morning 30 minutes before eating for hypothyroidism blood pressure is well controlled with Zestril.  She denies cough.  On occasion when she stands she has a brief spinning sensation that resolves quickly.  She denies any headache or hearing loss.  Scheduled for consultation for left knee replacement at the end of the month.  She uses a walker for gait stability.  Past Medical History:  Diagnosis Date  . Allergy   . Arthritis   . Asthma    mild  . Blood transfusion without reported diagnosis   . H/O bronchitis   . Hemorrhoid   . Hx of blood clots 1967   from birth control in LUE  . Hypertension   . Hypoglycemia   . Hypothyroidism   . Migraine    from food  . Osteopenia 07/04/2019  . Pneumonia    hx of  . Seasonal allergies   . Sleep apnea    CPAP  . UTI (urinary tract infection)     Past Surgical History:  Procedure Laterality Date  . CATARACT EXTRACTION     rt eye  . COLONOSCOPY  05/13/2008   Ardis Hughs  . detatched retina  2003   rt eye  . 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 Thyroid disease Mother   . Osteoarthritis Mother   . Thyroid disease Father   . Osteoarthritis Father   . Osteoarthritis  Maternal Grandmother   . Colon cancer Maternal Grandmother 36  . Osteoarthritis Maternal Grandfather   . Osteoarthritis Paternal Grandmother   . Esophageal cancer Neg Hx   . Rectal cancer Neg Hx   . Stomach cancer Neg Hx   . Colon polyps Neg Hx     Social History   Socioeconomic History  . Marital status: Legally Separated    Spouse name: Not on file  . Number of children: Not on file  . Years of education: Not on file  . Highest education level: Not on file  Occupational History  . Not on file  Tobacco Use  . Smoking status: Former Smoker    Packs/day: 3.00    Years: 13.00    Pack years: 39.00    Types: Cigarettes    Quit date: 10/11/1974    Years since quitting: 46.4  . Smokeless tobacco: Never Used  Vaping Use  . Vaping Use: Never used  Substance and Sexual Activity  . Alcohol use: No    Alcohol/week: 0.0 standard drinks  . Drug use: No  . Sexual activity: Not Currently  Other Topics Concern  . Not on file  Social History Narrative  . Not on file   Social Determinants of Health   Financial Resource  Strain: Low Risk   . Difficulty of Paying Living Expenses: Not hard at all  Food Insecurity: No Food Insecurity  . Worried About Charity fundraiser in the Last Year: Never true  . Ran Out of Food in the Last Year: Never true  Transportation Needs: No Transportation Needs  . Lack of Transportation (Medical): No  . Lack of Transportation (Non-Medical): No  Physical Activity: Inactive  . Days of Exercise per Week: 0 days  . Minutes of Exercise per Session: 0 min  Stress: No Stress Concern Present  . Feeling of Stress : Not at all  Social Connections: Moderately Isolated  . Frequency of Communication with Friends and Family: More than three times a week  . Frequency of Social Gatherings with Friends and Family: More than three times a week  . Attends Religious Services: 1 to 4 times per year  . Active Member of Clubs or Organizations: No  . Attends Theatre manager Meetings: Never  . Marital Status: Separated  Intimate Partner Violence: Not At Risk  . Fear of Current or Ex-Partner: No  . Emotionally Abused: No  . Physically Abused: No  . Sexually Abused: No    Outpatient Medications Prior to Visit  Medication Sig Dispense Refill  . acetaminophen (TYLENOL) 500 MG tablet Take 500-1,000 mg by mouth as needed for mild pain or moderate pain.    Marland Kitchen albuterol (PROAIR HFA) 108 (90 Base) MCG/ACT inhaler Inhale 2 puffs into the lungs every 6 (six) hours as needed for wheezing or shortness of breath. 1 each 0  . B Complex-C (B-COMPLEX WITH VITAMIN C) tablet Take 1 tablet by mouth daily.    . Cholecalciferol (VITAMIN D) 2000 UNITS CAPS Take 2,000 Units by mouth 2 (two) times daily.     . fexofenadine (ALLEGRA) 60 MG tablet Take 60 mg by mouth daily.    . Lactobacillus-Inulin (CULTURELLE DIGESTIVE DAILY PO) Take by mouth.    Marland Kitchen lisinopril (ZESTRIL) 20 MG tablet TAKE 1 TABLET BY MOUTH AT BEDTIME 90 tablet 0  . nystatin (MYCOSTATIN/NYSTOP) powder Apply topically 2 (two) times daily as needed. Beneath both breasts 15 g 0  . triamcinolone ointment (KENALOG) 0.1 % Apply once daily to hands as needed for hand eczema only. 30 g 2  . levothyroxine (SYNTHROID) 50 MCG tablet Take 1 tablet (50 mcg total) by mouth daily. 90 tablet 3   No facility-administered medications prior to visit.    Allergies  Allergen Reactions  . Codeine Hives and Nausea And Vomiting  . Pork-Derived Products Other (See Comments)    Migraines w/ pork and other processed meats.  . Prednisone Other (See Comments)    Causes more pain    ROS Review of Systems  Constitutional: Negative.   HENT: Negative.  Negative for hearing loss.   Eyes: Negative for photophobia and visual disturbance.  Respiratory: Negative.  Negative for cough.   Cardiovascular: Negative.   Gastrointestinal: Negative.   Genitourinary: Negative.   Musculoskeletal: Positive for arthralgias and gait problem.   Skin: Negative for color change and pallor.  Neurological: Positive for dizziness. Negative for speech difficulty and headaches.  Hematological: Does not bruise/bleed easily.  Psychiatric/Behavioral: Negative.       Objective:    Physical Exam Vitals and nursing note reviewed.  Constitutional:      General: She is not in acute distress.    Appearance: Normal appearance. She is not ill-appearing, toxic-appearing or diaphoretic.  HENT:     Head: Normocephalic and  atraumatic.     Right Ear: Tympanic membrane, ear canal and external ear normal.     Left Ear: Tympanic membrane, ear canal and external ear normal.     Mouth/Throat:     Mouth: Mucous membranes are dry.     Pharynx: Oropharynx is clear. No oropharyngeal exudate or posterior oropharyngeal erythema.  Eyes:     General:        Right eye: No discharge.        Left eye: No discharge.     Extraocular Movements: Extraocular movements intact.     Conjunctiva/sclera: Conjunctivae normal.     Pupils: Pupils are equal, round, and reactive to light.  Neck:     Vascular: No carotid bruit.  Cardiovascular:     Rate and Rhythm: Normal rate and regular rhythm.  Pulmonary:     Effort: Pulmonary effort is normal.     Breath sounds: Normal breath sounds.  Abdominal:     General: Bowel sounds are normal.  Musculoskeletal:     Cervical back: No rigidity or tenderness.  Lymphadenopathy:     Cervical: No cervical adenopathy.  Skin:    General: Skin is warm and dry.  Neurological:     Mental Status: She is alert and oriented to person, place, and time.  Psychiatric:        Mood and Affect: Mood normal.        Behavior: Behavior normal.     BP 134/68   Pulse 64   Temp 97.7 F (36.5 C) (Temporal)   Ht 5\' 4"  (1.626 m)   Wt 194 lb 6.4 oz (88.2 kg)   SpO2 96%   BMI 33.37 kg/m  Wt Readings from Last 3 Encounters:  03/11/21 194 lb 6.4 oz (88.2 kg)  03/03/21 195 lb (88.5 kg)  02/23/21 192 lb (87.1 kg)     Health  Maintenance Due  Topic Date Due  . Zoster Vaccines- Shingrix (1 of 2) Never done  . TETANUS/TDAP  03/27/2018    There are no preventive care reminders to display for this patient.  Lab Results  Component Value Date   TSH 3.03 12/10/2020   Lab Results  Component Value Date   WBC 5.0 12/26/2019   HGB 12.0 12/26/2019   HCT 36.4 12/26/2019   MCV 94.7 12/26/2019   PLT 233.0 12/26/2019   Lab Results  Component Value Date   NA 141 12/26/2019   K 4.2 12/26/2019   CO2 32 12/26/2019   GLUCOSE 79 12/26/2019   BUN 10 12/26/2019   CREATININE 0.79 12/26/2019   BILITOT 0.7 12/26/2019   ALKPHOS 67 12/26/2019   AST 17 12/26/2019   ALT 9 12/26/2019   PROT 6.7 12/26/2019   ALBUMIN 3.9 12/26/2019   CALCIUM 9.3 12/26/2019   GFR 70.99 12/26/2019   Lab Results  Component Value Date   CHOL 184 12/26/2019   Lab Results  Component Value Date   HDL 89.20 12/26/2019   Lab Results  Component Value Date   LDLCALC 74 12/26/2019   Lab Results  Component Value Date   TRIG 104.0 12/26/2019   Lab Results  Component Value Date   CHOLHDL 2 12/26/2019   Lab Results  Component Value Date   HGBA1C 5.8 12/10/2020      Assessment & Plan:   Problem List Items Addressed This Visit      Cardiovascular and Mediastinum   Essential hypertension - Primary   Relevant Orders   CBC   Comprehensive metabolic panel  Endocrine   Hypothyroidism   Relevant Medications   levothyroxine (SYNTHROID) 50 MCG tablet     Other   Healthcare maintenance   Relevant Orders   Lipid panel   Vitamin D deficiency      Meds ordered this encounter  Medications  . levothyroxine (SYNTHROID) 50 MCG tablet    Sig: Take 1 tablet (50 mcg total) by mouth daily.    Dispense:  90 tablet    Refill:  3    Follow-up: Return in about 6 months (around 09/10/2021), or Return fasting for ordered blood work..  Continue current dose of lisinopril levothyroxine and colecalciferol.  She will return fasting for  above ordered blood work.  Libby Maw, MD

## 2021-03-12 ENCOUNTER — Other Ambulatory Visit: Payer: Self-pay

## 2021-03-12 ENCOUNTER — Other Ambulatory Visit (INDEPENDENT_AMBULATORY_CARE_PROVIDER_SITE_OTHER): Payer: Medicare HMO

## 2021-03-12 ENCOUNTER — Encounter: Payer: Self-pay | Admitting: Gastroenterology

## 2021-03-12 DIAGNOSIS — Z0000 Encounter for general adult medical examination without abnormal findings: Secondary | ICD-10-CM | POA: Diagnosis not present

## 2021-03-12 DIAGNOSIS — I10 Essential (primary) hypertension: Secondary | ICD-10-CM

## 2021-03-12 LAB — CBC
HCT: 35.1 % — ABNORMAL LOW (ref 36.0–46.0)
Hemoglobin: 11.7 g/dL — ABNORMAL LOW (ref 12.0–15.0)
MCHC: 33.2 g/dL (ref 30.0–36.0)
MCV: 93.1 fl (ref 78.0–100.0)
Platelets: 232 10*3/uL (ref 150.0–400.0)
RBC: 3.77 Mil/uL — ABNORMAL LOW (ref 3.87–5.11)
RDW: 13.4 % (ref 11.5–15.5)
WBC: 4.3 10*3/uL (ref 4.0–10.5)

## 2021-03-12 NOTE — Progress Notes (Signed)
Per orders of Dr. Ethelene Hal pt is here for labs, pt tolerated draw well.

## 2021-03-13 LAB — COMPREHENSIVE METABOLIC PANEL
ALT: 7 U/L (ref 0–35)
AST: 17 U/L (ref 0–37)
Albumin: 3.9 g/dL (ref 3.5–5.2)
Alkaline Phosphatase: 60 U/L (ref 39–117)
BUN: 10 mg/dL (ref 6–23)
CO2: 28 mEq/L (ref 19–32)
Calcium: 9.3 mg/dL (ref 8.4–10.5)
Chloride: 106 mEq/L (ref 96–112)
Creatinine, Ser: 0.8 mg/dL (ref 0.40–1.20)
GFR: 71.78 mL/min (ref >60.00)
Glucose, Bld: 82 mg/dL (ref 70–99)
Potassium: 4.2 mEq/L (ref 3.5–5.1)
Sodium: 143 mEq/L (ref 135–145)
Total Bilirubin: 0.4 mg/dL (ref 0.2–1.2)
Total Protein: 6.4 g/dL (ref 6.0–8.3)

## 2021-03-13 LAB — LIPID PANEL
Cholesterol: 163 mg/dL (ref 0–200)
HDL: 68.9 mg/dL (ref >39.00)
LDL Cholesterol: 63 mg/dL (ref 0–99)
NonHDL: 94.46
Total CHOL/HDL Ratio: 2
Triglycerides: 158 mg/dL — ABNORMAL HIGH (ref 0.0–149.0)
VLDL: 31.6 mg/dL (ref 0.0–40.0)

## 2021-03-15 DIAGNOSIS — M169 Osteoarthritis of hip, unspecified: Secondary | ICD-10-CM | POA: Diagnosis not present

## 2021-03-15 DIAGNOSIS — G4733 Obstructive sleep apnea (adult) (pediatric): Secondary | ICD-10-CM | POA: Diagnosis not present

## 2021-04-03 DIAGNOSIS — M1712 Unilateral primary osteoarthritis, left knee: Secondary | ICD-10-CM | POA: Diagnosis not present

## 2021-04-03 DIAGNOSIS — M1711 Unilateral primary osteoarthritis, right knee: Secondary | ICD-10-CM | POA: Diagnosis not present

## 2021-04-03 DIAGNOSIS — M25562 Pain in left knee: Secondary | ICD-10-CM | POA: Diagnosis not present

## 2021-04-03 DIAGNOSIS — M170 Bilateral primary osteoarthritis of knee: Secondary | ICD-10-CM | POA: Diagnosis not present

## 2021-04-07 ENCOUNTER — Ambulatory Visit: Payer: Medicare HMO | Admitting: Obstetrics and Gynecology

## 2021-04-08 ENCOUNTER — Ambulatory Visit: Payer: Medicare HMO | Admitting: Obstetrics and Gynecology

## 2021-04-14 DIAGNOSIS — M169 Osteoarthritis of hip, unspecified: Secondary | ICD-10-CM | POA: Diagnosis not present

## 2021-04-14 DIAGNOSIS — G4733 Obstructive sleep apnea (adult) (pediatric): Secondary | ICD-10-CM | POA: Diagnosis not present

## 2021-05-08 DIAGNOSIS — M1712 Unilateral primary osteoarthritis, left knee: Secondary | ICD-10-CM | POA: Diagnosis not present

## 2021-05-14 ENCOUNTER — Ambulatory Visit: Payer: Medicare HMO | Admitting: Obstetrics and Gynecology

## 2021-05-14 ENCOUNTER — Other Ambulatory Visit: Payer: Self-pay

## 2021-05-14 ENCOUNTER — Encounter: Payer: Self-pay | Admitting: Obstetrics and Gynecology

## 2021-05-14 VITALS — BP 128/62 | HR 50 | Ht 64.5 in | Wt 185.0 lb

## 2021-05-14 DIAGNOSIS — T8389XA Other specified complication of genitourinary prosthetic devices, implants and grafts, initial encounter: Secondary | ICD-10-CM | POA: Diagnosis not present

## 2021-05-14 DIAGNOSIS — N898 Other specified noninflammatory disorders of vagina: Secondary | ICD-10-CM | POA: Diagnosis not present

## 2021-05-14 DIAGNOSIS — N3946 Mixed incontinence: Secondary | ICD-10-CM | POA: Diagnosis not present

## 2021-05-14 DIAGNOSIS — Z4689 Encounter for fitting and adjustment of other specified devices: Secondary | ICD-10-CM

## 2021-05-14 MED ORDER — TRIMO-SAN 0.025 % VA GEL: VAGINAL | 1 refills | Status: DC

## 2021-05-14 NOTE — Patient Instructions (Addendum)
Kegel Exercises  Kegel exercises can help strengthen your pelvic floor muscles. The pelvic floor is a group of muscles that support your rectum, small intestine, and bladder. In females, pelvic floor muscles also help support the womb (uterus). These muscles help you control the flow of urine and stool. Kegel exercises are painless and simple, and they do not require any equipment. Your provider may suggest Kegel exercises to: Improve bladder and bowel control. Improve sexual response. Improve weak pelvic floor muscles after surgery to remove the uterus (hysterectomy) or pregnancy (females). Improve weak pelvic floor muscles after prostate gland removal or surgery (males). Kegel exercises involve squeezing your pelvic floor muscles, which are the same muscles you squeeze when you try to stop the flow of urine or keep from passing gas. The exercises can be done while sitting, standing, or lying down, but itis best to vary your position. Exercises How to do Kegel exercises: Squeeze your pelvic floor muscles tight. You should feel a tight lift in your rectal area. If you are a female, you should also feel a tightness in your vaginal area. Keep your stomach, buttocks, and legs relaxed. Hold the muscles tight for up to 10 seconds. Breathe normally. Relax your muscles. Repeat as told by your health care provider. Repeat this exercise daily as told by your health care provider. Continue to do this exercise for at least 4-6 weeks, or for as long as told by your healthcare provider. You may be referred to a physical therapist who can help you learn more abouthow to do Kegel exercises. Depending on your condition, your health care provider may recommend: Varying how long you squeeze your muscles. Doing several sets of exercises every day. Doing exercises for several weeks. Making Kegel exercises a part of your regular exercise routine. This information is not intended to replace advice given to you by  your health care provider. Make sure you discuss any questions you have with your healthcare provider. Document Revised: 09/17/2020 Document Reviewed: 05/17/2018 Elsevier Patient Education  2022 [employer].  Try taking the pessary out overnight 2 x a week, use the trimosan cream vaginally on the nights you take the pessary out.

## 2021-05-14 NOTE — Progress Notes (Signed)
GYNECOLOGY  VISIT   HPI: 75 y.o.   Legally Separated White or Caucasian Not Hispanic or Latino  female   [phone] with No LMP recorded. Patient is postmenopausal.   here for pessary check.   She has a #4 ring pessary with support for a grade 2 uterine prolapse and grade 1-2 cystocele. No problems. No vaginal bleeding, no vaginal discharge. Urge incontinence is a lot better. No bowel c/o.   She had a colonoscopy 03/03/21 had some polyps removed, f/u in 3 years.   GYNECOLOGIC HISTORY: No LMP recorded. Patient is postmenopausal. 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   Pain due to onychomycosis of toenails of both feet 04/22/2020   History of hypoglycemia 12/25/2019   Onychomycosis 12/25/2019   Abnormal serum cholesterol 12/25/2019   Osteopenia 07/04/2019   Obesity (BMI 30-39.9) 12/22/2017   Vitamin D deficiency 05/18/2016   Constipation 09/25/2013   Acute blood loss anemia 09/20/2013   Osteoarthritis of right hip 09/19/2013   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   Heel pain 02/16/2013   Knee pain 07/05/2012   Bilateral bunions 07/05/2012   Screening for malignant neoplasm of the cervix 07/05/2012   Healthcare maintenance 07/05/2012   Sinusitis 02/18/2012   Anxiety and depression 11/02/2011   OSA on CPAP 11/02/2011   Asthma 08/12/2010   PANIC DISORDER 05/07/2009   Essential hypertension 12/05/2008   Hypothyroidism 03/27/2008   ARTHRITIS 03/27/2008   Allergic rhinitis 08/26/2007    Past Medical History:  Diagnosis Date   Allergy    Arthritis    Asthma    mild   Blood  transfusion without reported diagnosis    H/O bronchitis    Hemorrhoid    Hx of blood clots 1967   from birth control in LUE   Hypertension    Hypoglycemia    Hypothyroidism    Migraine    from food   Osteopenia 07/04/2019   Pneumonia    hx of   Seasonal allergies    Sleep apnea    CPAP   UTI (urinary tract infection)     Past Surgical History:  Procedure Laterality Date   CATARACT EXTRACTION     rt eye   COLONOSCOPY  05/13/2008   Ardis Hughs   detatched retina  2003   rt eye   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   Procedure: RIGHT TOTAL HIP ARTHROPLASTY;  Surgeon: Tobi Bastos, MD;  Location: [facility];  Service: Orthopedics;  Laterality: Right;    Current Outpatient Medications  Medication Sig Dispense Refill   acetaminophen (TYLENOL) 500 MG tablet Take 500-1,000 mg by mouth as needed for mild pain or moderate pain.     albuterol (PROAIR HFA) 108 (90 Base) MCG/ACT inhaler Inhale 2 puffs into the lungs every 6 (six) hours  as needed for wheezing or shortness of breath. 1 each 0   B Complex-C (B-COMPLEX WITH VITAMIN C) tablet Take 1 tablet by mouth daily.     Cholecalciferol (VITAMIN D) 2000 UNITS CAPS Take 2,000 Units by mouth 2 (two) times daily.      fexofenadine (ALLEGRA) 60 MG tablet Take 60 mg by mouth daily.     Lactobacillus-Inulin (CULTURELLE DIGESTIVE DAILY PO) Take by mouth.     levothyroxine (SYNTHROID) 50 MCG tablet Take 1 tablet (50 mcg total) by mouth daily. 90 tablet 3   lisinopril (ZESTRIL) 20 MG tablet TAKE 1 TABLET BY MOUTH AT BEDTIME 90 tablet 0   nystatin (MYCOSTATIN/NYSTOP) powder Apply topically 2 (two) times daily as needed. Beneath both breasts 15 g 0   triamcinolone ointment (KENALOG) 0.1 % Apply once daily to hands as needed for hand eczema only. 30 g 2   No current facility-administered medications for this visit.     ALLERGIES: Codeine, Pork-derived products, and Prednisone  Family History  Problem Relation Age of Onset   Thyroid disease  Mother    Osteoarthritis Mother    Thyroid disease Father    Osteoarthritis Father    Osteoarthritis Maternal Grandmother    Colon cancer Maternal Grandmother 51   Osteoarthritis Maternal Grandfather    Osteoarthritis Paternal Grandmother    Esophageal cancer Neg Hx    Rectal cancer Neg Hx    Stomach cancer Neg Hx    Colon polyps Neg Hx     Social History   Socioeconomic History   Marital status: Legally Separated    Spouse name: Not on file   Number of children: Not on file   Years of education: Not on file   Highest education level: Not on file  Occupational History   Not on file  Tobacco Use   Smoking status: Former    Packs/day: 3.00    Years: 13.00    Pack years: 39.00    Types: Cigarettes    Quit date: 10/11/1974    Years since quitting: 46.6   Smokeless tobacco: Never  Vaping Use   Vaping Use: Never used  Substance and Sexual Activity   Alcohol use: No    Alcohol/week: 0.0 standard drinks   Drug use: No   Sexual activity: Not Currently  Other Topics Concern   Not on file  Social History Narrative   Not on file   Social Determinants of Health   Financial Resource Strain: Low Risk    Difficulty of Paying Living Expenses: Not hard at all  Food Insecurity: No Food Insecurity   Worried About Charity fundraiser in the Last Year: Never true   Ran Out of Food in the Last Year: Never true  Transportation Needs: No Transportation Needs   Lack of Transportation (Medical): No   Lack of Transportation (Non-Medical): No  Physical Activity: Inactive   Days of Exercise per Week: 0 days   Minutes of Exercise per Session: 0 min  Stress: No Stress Concern Present   Feeling of Stress : Not at all  Social Connections: Moderately Isolated   Frequency of Communication with Friends and Family: More than three times a week   Frequency of Social Gatherings with Friends and Family: More than three times a week   Attends Religious Services: 1 to 4 times per year   Active  Member of [employer] or Organizations: No   Attends Archivist Meetings: Never   Marital Status: Separated  Intimate Partner Violence:  Not At Risk   Fear of Current or Ex-Partner: No   Emotionally Abused: No   Physically Abused: No   Sexually Abused: No    ROS  PHYSICAL EXAMINATION:    BP 128/62   Pulse (!) 50   Ht 5' 4.5" (1.638 m)   Wt 185 lb (83.9 kg)   SpO2 99%   BMI 31.26 kg/m     General appearance: alert, cooperative and appears stated age   Pelvic: External genitalia:  no lesions              Urethra:  normal appearing urethra with no masses, tenderness or lesions              Bartholins and Skenes: normal                 Vagina: the pessary was removed and cleaned, mild erythema in the posterior upper vagina. Pessary was replaced.               Cervix: no lesions               1. Pessary maintenance Comfortable with the pessary, but slight vaginal irriation  2. Vaginal irritation from pessary [facility]) She will try and take the pessary out overnight 2 x a week Use trimosan cream the nights she takes the pessary out F/U in one month Not great candidate for vaginal estrogen h/o upper extremity blood clot.  3. Mixed incontinence Improved with the pessary Given information on bladder training and kegel exercises.

## 2021-05-15 DIAGNOSIS — M1712 Unilateral primary osteoarthritis, left knee: Secondary | ICD-10-CM | POA: Diagnosis not present

## 2021-05-22 DIAGNOSIS — M1712 Unilateral primary osteoarthritis, left knee: Secondary | ICD-10-CM | POA: Diagnosis not present

## 2021-06-03 ENCOUNTER — Ambulatory Visit: Payer: Medicare HMO | Admitting: Primary Care

## 2021-06-03 ENCOUNTER — Encounter: Payer: Self-pay | Admitting: Adult Health

## 2021-06-03 ENCOUNTER — Other Ambulatory Visit: Payer: Self-pay

## 2021-06-03 ENCOUNTER — Ambulatory Visit: Payer: Medicare HMO | Admitting: Adult Health

## 2021-06-03 DIAGNOSIS — Z9989 Dependence on other enabling machines and devices: Secondary | ICD-10-CM

## 2021-06-03 DIAGNOSIS — G4733 Obstructive sleep apnea (adult) (pediatric): Secondary | ICD-10-CM | POA: Diagnosis not present

## 2021-06-03 NOTE — Assessment & Plan Note (Signed)
Moderate obstructive sleep apnea.  Patient has excellent compliance on CPAP.  Over the last couple years she has had increased central events and residuals.  CPAP download over the last 30 days shows increased central events at 8/hour.  She has not used any narcotics or sedating medications.  She has no known history of congestive heart failure or stroke. Will adjust CPAP pressures to an AutoSet to see if this helps at all.  If continues to have significant residuals and now with increased daytime sleepiness.  We will consider a CPAP titration may need ASV device.  Plan  Patient Instructions  Change CPAP Pressure to 5 to 15 cm H2O .  CPAP download in 1 month . -if not better will discuss CPAP titration .  Continue on CPAP At bedtime   Work on healthy weight Do not drive a sleepy Follow-up in 6 months with Dr. Elsworth Soho and as needed

## 2021-06-03 NOTE — Progress Notes (Signed)
$'@Patient'P$  ID: [name], female    DOB: 1946/03/27, 75 y.o.   MRN: [id number]  Chief Complaint  Patient presents with   Follow-up    Referring provider: Libby Maw  HPI: 75 year old female former smoker followed for obstructive sleep apnea  TEST/EVENTS :  Sleep study 07/2011 >AHI of 16/hr   06/03/2021 Follow up : OSA  Patient presents for a 1 year follow-up.  Patient has underlying obstructive sleep apnea is on nocturnal CPAP.  Patient says overall she is doing okay on CPAP.  Feels that she benefits from CPAP but noticing more daytime sleepiness over this past year.   CPAP download shows excellent compliance with average usage around 8 hours.  Patient is on CPAP 12 cm of H2O.  AHI is 9.9 (Central events 8/hr)  We discussed healthy weight loss No known history of CHF or CVA . Denies narcotic use  .   Allergies  Allergen Reactions   Codeine Hives and Nausea And Vomiting   Pork-Derived Products Other (See Comments)    Migraines w/ pork and other processed meats.   Prednisone Other (See Comments)    Causes more pain    Immunization History  Administered Date(s) Administered   Fluad Quad(high Dose 65+) 06/12/2019, 07/22/2020   Influenza Split 07/20/2011, 07/05/2012   Influenza Whole 08/31/2007, 07/18/2008, 07/17/2009, 07/09/2010   Influenza, High Dose Seasonal PF 08/04/2013, 07/04/2015, 07/21/2016, 07/22/2017, 08/07/2018   Influenza,inj,Quad PF,6+ Mos 08/06/2014   PFIZER(Purple Top)SARS-COV-2 Vaccination 12/04/2019, 12/25/2019, 08/09/2020   Pneumococcal Conjugate-13 11/14/2014   Pneumococcal Polysaccharide-23 09/27/2007, 11/19/2015   Td 03/27/2008   Zoster, Live 11/02/2011    Past Medical History:  Diagnosis Date   Allergy    Arthritis    Asthma    mild   Blood transfusion without reported diagnosis    H/O bronchitis    Hemorrhoid    Hx of blood clots 1967   from birth control in LUE   Hypertension    Hypoglycemia    Hypothyroidism    Migraine     from food   Osteopenia 07/04/2019   Pneumonia    hx of   Seasonal allergies    Sleep apnea    CPAP   UTI (urinary tract infection)     Tobacco History: Social History   Tobacco Use  Smoking Status Former   Packs/day: 3.00   Years: 13.00   Pack years: 39.00   Types: Cigarettes   Quit date: 10/11/1974   Years since quitting: 46.6  Smokeless Tobacco Never   Counseling given: Not Answered   Outpatient Medications Prior to Visit  Medication Sig Dispense Refill   acetaminophen (TYLENOL) 500 MG tablet Take 500-1,000 mg by mouth as needed for mild pain or moderate pain.     albuterol (PROAIR HFA) 108 (90 Base) MCG/ACT inhaler Inhale 2 puffs into the lungs every 6 (six) hours as needed for wheezing or shortness of breath. 1 each 0   Cholecalciferol (VITAMIN D) 2000 UNITS CAPS Take 2,000 Units by mouth daily.     fexofenadine (ALLEGRA) 60 MG tablet Take 60 mg by mouth daily.     Lactobacillus-Inulin (CULTURELLE DIGESTIVE DAILY PO) Take by mouth.     levothyroxine (SYNTHROID) 50 MCG tablet Take 1 tablet (50 mcg total) by mouth daily. 90 tablet 3   lisinopril (ZESTRIL) 20 MG tablet TAKE 1 TABLET BY MOUTH AT BEDTIME 90 tablet 0   Multiple Vitamins-Minerals (MULTIVITAMIN WITH MINERALS) tablet Take 1 tablet by mouth daily.  nystatin (MYCOSTATIN/NYSTOP) powder Apply topically 2 (two) times daily as needed. Beneath both breasts 15 g 0   OXYQUINOLONE SULFATE VAGINAL (TRIMO-SAN) 0.025 % GEL Use one applicator full vaginally 2 x a week at hs 113.4 g 1   triamcinolone ointment (KENALOG) 0.1 % Apply once daily to hands as needed for hand eczema only. 30 g 2   B Complex-C (B-COMPLEX WITH VITAMIN C) tablet Take 1 tablet by mouth daily. (Patient not taking: Reported on 06/03/2021)     No facility-administered medications prior to visit.     Review of Systems:   Constitutional:   No  weight loss, night sweats,  Fevers, chills,  +fatigue, or  lassitude.  HEENT:   No headaches,  Difficulty  swallowing,  Tooth/dental problems, or  Sore throat,                No sneezing, itching, ear ache, nasal congestion, post nasal drip,   CV:  No chest pain,  Orthopnea, PND, swelling in lower extremities, anasarca, dizziness, palpitations, syncope.   GI  No heartburn, indigestion, abdominal pain, nausea, vomiting, diarrhea, change in bowel habits, loss of appetite, bloody stools.   Resp: No shortness of breath with exertion or at rest.  No excess mucus, no productive cough,  No non-productive cough,  No coughing up of blood.  No change in color of mucus.  No wheezing.  No chest wall deformity  Skin: no rash or lesions.  GU: no dysuria, change in color of urine, no urgency or frequency.  No flank pain, no hematuria   MS:  No joint pain or swelling.  No decreased range of motion.  No back pain.    Physical Exam  BP 122/70 (BP Location: Right Arm, Patient Position: Sitting, Cuff Size: Normal)   Pulse 65   Temp 98.4 F (36.9 C) (Oral)   Ht '5\' 4"'$  (1.626 m)   Wt 188 lb 9.6 oz (85.5 kg)   SpO2 98%   BMI 32.37 kg/m   GEN: A/Ox3; pleasant , NAD, elderly walks with cane    HEENT:  [place]/AT,   NOSE-clear, THROAT-clear, no lesions, no postnasal drip or exudate noted.   NECK:  Supple w/ fair ROM; no JVD; normal carotid impulses w/o bruits; no thyromegaly or nodules palpated; no lymphadenopathy.    RESP  Clear  P & A; w/o, wheezes/ rales/ or rhonchi. no accessory muscle use, no dullness to percussion  CARD:  RRR, no m/r/g, tr  peripheral edema, pulses intact, no cyanosis or clubbing.  GI:   Soft & nt; nml bowel sounds; no organomegaly or masses detected.   Musco: Warm bil, no deformities or joint swelling noted.   Neuro: alert, no focal deficits noted.    Skin: Warm, no lesions or rashes    Lab Results:  CBC    Component Value Date/Time   WBC 4.3 03/12/2021 1024   RBC 3.77 (L) 03/12/2021 1024   HGB 11.7 (L) 03/12/2021 1024   HCT 35.1 (L) 03/12/2021 1024   PLT 232.0 03/12/2021  1024   MCV 93.1 03/12/2021 1024   MCH 30.6 09/23/2013 0512   MCHC 33.2 03/12/2021 1024   RDW 13.4 03/12/2021 1024   LYMPHSABS 1.0 05/15/2015 0901   MONOABS 0.3 05/15/2015 0901   EOSABS 0.1 05/15/2015 0901   BASOSABS 0.0 05/15/2015 0901    BMET    Component Value Date/Time   NA 143 03/12/2021 1024   K 4.2 03/12/2021 1024   CL 106 03/12/2021 1024  CO2 28 03/12/2021 1024   GLUCOSE 82 03/12/2021 1024   BUN 10 03/12/2021 1024   CREATININE 0.80 03/12/2021 1024   CALCIUM 9.3 03/12/2021 1024   GFRNONAA 84 (L) 09/21/2013 0455   GFRAA >90 09/21/2013 0455    BNP No results found for: BNP  ProBNP No results found for: PROBNP  Imaging: No results found.    No flowsheet data found.  No results found for: NITRICOXIDE      Assessment & Plan:   OSA on CPAP Moderate obstructive sleep apnea.  Patient has excellent compliance on CPAP.  Over the last couple years she has had increased central events and residuals.  CPAP download over the last 30 days shows increased central events at 8/hour.  She has not used any narcotics or sedating medications.  She has no known history of congestive heart failure or stroke. Will adjust CPAP pressures to an AutoSet to see if this helps at all.  If continues to have significant residuals and now with increased daytime sleepiness.  We will consider a CPAP titration may need ASV device.  Plan  Patient Instructions  Change CPAP Pressure to 5 to 15 cm H2O .  CPAP download in 1 month . -if not better will discuss CPAP titration .  Continue on CPAP At bedtime   Work on healthy weight Do not drive a sleepy Follow-up in 6 months with Dr. Elsworth Soho and as needed        Rexene Edison, NP 06/03/2021

## 2021-06-03 NOTE — Patient Instructions (Addendum)
Change CPAP Pressure to 5 to 15 cm H2O .  CPAP download in 1 month . -if not better will discuss CPAP titration .  Continue on CPAP At bedtime   Work on healthy weight Do not drive a sleepy Follow-up in 6 months with Dr. Elsworth Soho and as needed

## 2021-06-04 ENCOUNTER — Other Ambulatory Visit: Payer: Self-pay | Admitting: Family Medicine

## 2021-06-04 DIAGNOSIS — I10 Essential (primary) hypertension: Secondary | ICD-10-CM

## 2021-06-09 ENCOUNTER — Ambulatory Visit: Payer: Medicare HMO | Admitting: Podiatry

## 2021-06-09 ENCOUNTER — Encounter: Payer: Self-pay | Admitting: Podiatry

## 2021-06-09 ENCOUNTER — Other Ambulatory Visit: Payer: Self-pay

## 2021-06-09 DIAGNOSIS — B351 Tinea unguium: Secondary | ICD-10-CM | POA: Diagnosis not present

## 2021-06-09 DIAGNOSIS — M21611 Bunion of right foot: Secondary | ICD-10-CM

## 2021-06-09 DIAGNOSIS — M79674 Pain in right toe(s): Secondary | ICD-10-CM | POA: Diagnosis not present

## 2021-06-09 DIAGNOSIS — M79675 Pain in left toe(s): Secondary | ICD-10-CM | POA: Diagnosis not present

## 2021-06-09 DIAGNOSIS — M21612 Bunion of left foot: Secondary | ICD-10-CM

## 2021-06-09 NOTE — Progress Notes (Deleted)
GYNECOLOGY  VISIT   HPI: 75 y.o.   Legally Separated White or Caucasian Not Hispanic or Latino  female   [phone] with No LMP recorded. Patient is postmenopausal.   here for     GYNECOLOGIC HISTORY: No LMP recorded. Patient is postmenopausal. 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   Pain due to onychomycosis of toenails of both feet 04/22/2020   History of hypoglycemia 12/25/2019   Onychomycosis 12/25/2019   Abnormal serum cholesterol 12/25/2019   Osteopenia 07/04/2019   Obesity (BMI 30-39.9) 12/22/2017   Vitamin D deficiency 05/18/2016   Constipation 09/25/2013   Acute blood loss anemia 09/20/2013   Osteoarthritis of right hip 09/19/2013   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   Heel pain 02/16/2013   Knee pain 07/05/2012   Bilateral bunions 07/05/2012   Screening for malignant neoplasm of the cervix 07/05/2012   Healthcare maintenance 07/05/2012   Sinusitis 02/18/2012   Anxiety and depression 11/02/2011   OSA on CPAP 11/02/2011   Asthma 08/12/2010   PANIC DISORDER 05/07/2009   Essential hypertension 12/05/2008   Hypothyroidism 03/27/2008   ARTHRITIS 03/27/2008   Allergic rhinitis 08/26/2007    Past Medical History:  Diagnosis Date   Allergy    Arthritis    Asthma    mild   Blood transfusion without reported diagnosis    H/O bronchitis    Hemorrhoid    Hx of blood clots 1967   from birth control in LUE   Hypertension    Hypoglycemia    Hypothyroidism    Migraine    from food   Osteopenia 07/04/2019   Pneumonia    hx of   Seasonal allergies     Sleep apnea    CPAP   UTI (urinary tract infection)     Past Surgical History:  Procedure Laterality Date   CATARACT EXTRACTION     rt eye   COLONOSCOPY  05/13/2008   Ardis Hughs   detatched retina  2003   rt eye   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   Procedure: RIGHT TOTAL HIP ARTHROPLASTY;  Surgeon: Tobi Bastos, MD;  Location: [facility];  Service: Orthopedics;  Laterality: Right;    Current Outpatient Medications  Medication Sig Dispense Refill   acetaminophen (TYLENOL) 500 MG tablet Take 500-1,000 mg by mouth as needed for mild pain or moderate pain.     albuterol (PROAIR HFA) 108 (90 Base) MCG/ACT inhaler Inhale 2 puffs into the lungs every 6 (six) hours as needed for wheezing or shortness of breath. 1 each 0   Cholecalciferol (VITAMIN D) 2000 UNITS CAPS Take 2,000 Units by mouth daily.     fexofenadine (ALLEGRA) 60 MG tablet Take 60 mg by mouth daily.     Lactobacillus-Inulin (CULTURELLE DIGESTIVE DAILY PO) Take by mouth.  levothyroxine (SYNTHROID) 50 MCG tablet Take 1 tablet (50 mcg total) by mouth daily. 90 tablet 3   lisinopril (ZESTRIL) 20 MG tablet TAKE 1 TABLET BY MOUTH AT BEDTIME 90 tablet 1   Multiple Vitamins-Minerals (MULTIVITAMIN WITH MINERALS) tablet Take 1 tablet by mouth daily.     nystatin (MYCOSTATIN/NYSTOP) powder Apply topically 2 (two) times daily as needed. Beneath both breasts 15 g 0   OXYQUINOLONE SULFATE VAGINAL (TRIMO-SAN) 0.025 % GEL Use one applicator full vaginally 2 x a week at hs 113.4 g 1   triamcinolone ointment (KENALOG) 0.1 % Apply once daily to hands as needed for hand eczema only. 30 g 2   No current facility-administered medications for this visit.     ALLERGIES: Codeine, Pork-derived products, and Prednisone  Family History  Problem Relation Age of Onset   Thyroid disease Mother    Osteoarthritis Mother    Thyroid disease Father    Osteoarthritis Father    Osteoarthritis Maternal Grandmother    Colon cancer Maternal Grandmother  35   Osteoarthritis Maternal Grandfather    Osteoarthritis Paternal Grandmother    Esophageal cancer Neg Hx    Rectal cancer Neg Hx    Stomach cancer Neg Hx    Colon polyps Neg Hx     Social History   Socioeconomic History   Marital status: Legally Separated    Spouse name: Not on file   Number of children: Not on file   Years of education: Not on file   Highest education level: Not on file  Occupational History   Not on file  Tobacco Use   Smoking status: Former    Packs/day: 3.00    Years: 13.00    Pack years: 39.00    Types: Cigarettes    Quit date: 10/11/1974    Years since quitting: 46.6   Smokeless tobacco: Never  Vaping Use   Vaping Use: Never used  Substance and Sexual Activity   Alcohol use: No    Alcohol/week: 0.0 standard drinks   Drug use: No   Sexual activity: Not Currently  Other Topics Concern   Not on file  Social History Narrative   Not on file   Social Determinants of Health   Financial Resource Strain: Low Risk    Difficulty of Paying Living Expenses: Not hard at all  Food Insecurity: No Food Insecurity   Worried About Charity fundraiser in the Last Year: Never true   Ran Out of Food in the Last Year: Never true  Transportation Needs: No Transportation Needs   Lack of Transportation (Medical): No   Lack of Transportation (Non-Medical): No  Physical Activity: Inactive   Days of Exercise per Week: 0 days   Minutes of Exercise per Session: 0 min  Stress: No Stress Concern Present   Feeling of Stress : Not at all  Social Connections: Moderately Isolated   Frequency of Communication with Friends and Family: More than three times a week   Frequency of Social Gatherings with Friends and Family: More than three times a week   Attends Religious Services: 1 to 4 times per year   Active Member of [employer] or Organizations: No   Attends Archivist Meetings: Never   Marital Status: Separated  Intimate Partner Violence: Not At Risk   Fear of  Current or Ex-Partner: No   Emotionally Abused: No   Physically Abused: No   Sexually Abused: No    ROS  PHYSICAL EXAMINATION:    There were  no vitals taken for this visit.    General appearance: alert, cooperative and appears stated age Neck: no adenopathy, supple, symmetrical, trachea midline and thyroid {CHL AMB PHY EX THYROID NORM DEFAULT:[id number]::"normal to inspection and palpation"} Breasts: {Exam; breast:13139::"normal appearance, no masses or tenderness"} Abdomen: soft, non-tender; non distended, no masses,  no organomegaly  Pelvic: External genitalia:  no lesions              Urethra:  normal appearing urethra with no masses, tenderness or lesions              Bartholins and Skenes: normal                 Vagina: normal appearing vagina with normal color and discharge, no lesions              Cervix: {CHL AMB PHY EX CERVIX NORM DEFAULT:[id number]::"no lesions"}              Bimanual Exam:  Uterus:  {CHL AMB PHY EX UTERUS NORM [phone]::"normal size, contour, position, consistency, mobility, non-tender"}              Adnexa: {CHL AMB PHY EX ADNEXA NO MASS DEFAULT:[phone]::"no mass, fullness, tenderness"}              Rectovaginal: {yes no:[id number]}.  Confirms.              Anus:  normal sphincter tone, no lesions  Chaperone was present for exam.  ASSESSMENT     PLAN    An After Visit Summary was printed and given to the patient.  *** minutes face to face time of which over 50% was spent in counseling.

## 2021-06-09 NOTE — Progress Notes (Signed)
This patient returns to the office for evaluation and treatment of long thick painful nails .  This patient is unable to trim her own nails since the patient cannot reach her feet.  Patient says the nails are painful walking and wearing his shoes.  She returns for preventive foot care services.  General Appearance  Alert, conversant and in no acute stress.  Vascular  Dorsalis pedis and posterior tibial  pulses are palpable  bilaterally.  Capillary return is within normal limits  bilaterally. Temperature is within normal limits  bilaterally.  Neurologic  Senn-Weinstein monofilament wire test within normal limits  bilaterally. Muscle power within normal limits bilaterally.  Nails Thick disfigured discolored nails with subungual debris  from hallux to fifth toes bilaterally. No evidence of bacterial infection or drainage bilaterally.  Orthopedic  No limitations of motion  feet .  No crepitus or effusions noted.  No bony pathology or digital deformities noted.  HAV  B/L.  Skin  normotropic skin with no porokeratosis noted bilaterally.  No signs of infections or ulcers noted.   Asymptomatic callus sub 5th right foot.  Onychomycosis  Pain in toes right foot  Pain in toes left foot  Debridement  of nails  1-5  B/L with a nail nipper.  Nails were then filed using a dremel tool with no incidents.    RTC 3 months    Gardiner Barefoot DPM

## 2021-06-11 ENCOUNTER — Ambulatory Visit: Payer: Medicare HMO | Admitting: Obstetrics and Gynecology

## 2021-07-01 ENCOUNTER — Ambulatory Visit (INDEPENDENT_AMBULATORY_CARE_PROVIDER_SITE_OTHER): Payer: Medicare HMO

## 2021-07-01 ENCOUNTER — Other Ambulatory Visit: Payer: Self-pay

## 2021-07-01 ENCOUNTER — Encounter: Payer: Self-pay | Admitting: Family Medicine

## 2021-07-01 DIAGNOSIS — Z23 Encounter for immunization: Secondary | ICD-10-CM

## 2021-07-01 NOTE — Progress Notes (Signed)
After obtaining consent, and per orders of Dr. Ethelene Hal, injection of High dose Flu vaccine  given by Lynda Rainwater. Patient instructed to remain in clinic for 20 minutes afterwards, and to report any adverse reaction to me immediately.

## 2021-07-06 ENCOUNTER — Telehealth: Payer: Self-pay | Admitting: Adult Health

## 2021-07-06 NOTE — Telephone Encounter (Signed)
Called and spoke with patient. She verbalized understanding of TP's recs. She stated that she is not having any issues with her machine and has been sleeping soundly at night. She wishes to hold off on any changes at the moment. She is aware that if she changes her mind to call us.   Nothing further needed at time of call.

## 2021-07-06 NOTE — Telephone Encounter (Signed)
Call returned to patient, confirmed DOB. She states TP told her to call with an update once available. She states whatever changes were made they are working. She feels like her pressures are okay and she is not waking up as much.   Compliance report printed and placed in TP cubbie on pod to review.   TP please advise. Thanks :)

## 2021-07-06 NOTE — Telephone Encounter (Signed)
CPAP download is slightly improved , would decrease CPAP auto to 5 to 12cmH2O.  Glad she is feeling better.  Follow up in 6 months and As needed

## 2021-07-08 DIAGNOSIS — M8589 Other specified disorders of bone density and structure, multiple sites: Secondary | ICD-10-CM | POA: Diagnosis not present

## 2021-07-08 DIAGNOSIS — Z780 Asymptomatic menopausal state: Secondary | ICD-10-CM | POA: Diagnosis not present

## 2021-07-08 DIAGNOSIS — Z1231 Encounter for screening mammogram for malignant neoplasm of breast: Secondary | ICD-10-CM | POA: Diagnosis not present

## 2021-07-08 LAB — HM MAMMOGRAPHY

## 2021-07-08 LAB — HM DEXA SCAN

## 2021-07-09 DIAGNOSIS — M25562 Pain in left knee: Secondary | ICD-10-CM | POA: Diagnosis not present

## 2021-07-10 ENCOUNTER — Encounter: Payer: Self-pay | Admitting: Family Medicine

## 2021-08-05 ENCOUNTER — Telehealth: Payer: Self-pay | Admitting: Gastroenterology

## 2021-08-05 NOTE — Telephone Encounter (Signed)
The pt has loose stools and BRBPR.  She has no pain and the blood is on the tissue after a BM.  The blood does not continue after BM.  She has a history of hemorrhoids.  She has an appt on 08/11/21 with Nicoletta Ba PA.  She is aware to call or go to the ED if the bleeding is heavy or does not stop.

## 2021-08-05 NOTE — Telephone Encounter (Signed)
Inbound call from patient states she is not having any control of her bowels. Along with that, when she is wiping, there is blood. She is concerned. Best contact number [phone] if she doesn't answer can call [phone]

## 2021-08-11 ENCOUNTER — Encounter: Payer: Self-pay | Admitting: Physician Assistant

## 2021-08-11 ENCOUNTER — Ambulatory Visit: Payer: Medicare HMO | Admitting: Physician Assistant

## 2021-08-11 VITALS — BP 140/78 | HR 69 | Ht 64.0 in | Wt 178.0 lb

## 2021-08-11 DIAGNOSIS — K648 Other hemorrhoids: Secondary | ICD-10-CM | POA: Diagnosis not present

## 2021-08-11 DIAGNOSIS — K644 Residual hemorrhoidal skin tags: Secondary | ICD-10-CM

## 2021-08-11 DIAGNOSIS — K625 Hemorrhage of anus and rectum: Secondary | ICD-10-CM

## 2021-08-11 NOTE — Patient Instructions (Addendum)
If you are age 76 or older, your body mass index should be between 23-30. Your Body mass index is 30.55 kg/m. If this is out of the aforementioned range listed, please consider follow up with your Primary Care Provider. ________________________________________________________  The [place] [facility] providers would like to encourage you to use [facility] to communicate with providers for non-urgent requests or questions.  Due to long hold times on the telephone, sending your provider a message by [facility] may be a faster and more efficient way to get a response.  Please allow 48 business hours for a response.  Please remember that this is for non-urgent requests.  _______________________________________________________  Get Over the Counter Preparation H suppositories use 1  rectally at bedtime for 1 week then repeat as needed.  Follow up as needed.  Thank you for entrusting me with your care and choosing [facility].  Amy Esterwood, PA-C

## 2021-08-11 NOTE — Progress Notes (Signed)
Subjective:    Patient ID: [name], female    DOB: 1946/01/19, 75 y.o.   MRN: [id number]  HPI [name] he is a pleasant 75 year old white female, established with Dr. Ardis Hughs, who comes in today with complaints of recent episode of urgency and loose stools and associated bright red blood per rectum. She had undergone colonoscopy in May 2022 for follow-up of adenomatous polyps.  She was found to have 2 sessile polyps measuring 2 to 12 mm both removed and found to be tubular adenomas with no high-grade dysplasia, also with multiple diverticuli in the left colon and internal and external hemorrhoids documented. She says her current symptoms started about 2 weeks ago with a gassy unsettled stomach followed by urgency for bowel movement then passage of loose stools and started seeing bright red blood.  She says that she continues to see bright red blood with bowel movements over the next 3 days which she says seemed "heavy", then the rectal bleeding slowed down and subsided.  Her bowel movements have also returned to normal but remain on the soft side She is no longer seeing any blood in the commode but will see a small amount of blood on the tissue.  She has no current complaints of abdominal pain or discomfort. She has not had any problems with hemorrhoids or hemorrhoidal bleeding in the past. Medical problems include but are not limited to hypertension, asthma, sleep apnea with CPAP use, osteoarthritis, history of uterine prolapse and panic disorder.  Review of Systems Pertinent positive and negative review of systems were noted in the above HPI section.  All other review of systems was otherwise negative.   Outpatient Encounter Medications as of 08/11/2021  Medication Sig   acetaminophen (TYLENOL) 500 MG tablet Take 500-1,000 mg by mouth as needed for mild pain or moderate pain.   albuterol (PROAIR HFA) 108 (90 Base) MCG/ACT inhaler Inhale 2 puffs into the lungs every 6 (six) hours as needed  for wheezing or shortness of breath.   Cholecalciferol (VITAMIN D) 2000 UNITS CAPS Take 2,000 Units by mouth daily.   fexofenadine (ALLEGRA) 60 MG tablet Take 60 mg by mouth daily.   Lactobacillus-Inulin (CULTURELLE DIGESTIVE DAILY PO) Take by mouth.   levothyroxine (SYNTHROID) 50 MCG tablet Take 1 tablet (50 mcg total) by mouth daily.   lisinopril (ZESTRIL) 20 MG tablet TAKE 1 TABLET BY MOUTH AT BEDTIME   Multiple Vitamins-Minerals (MULTIVITAMIN WITH MINERALS) tablet Take 1 tablet by mouth daily.   nystatin (MYCOSTATIN/NYSTOP) powder Apply topically 2 (two) times daily as needed. Beneath both breasts   OXYQUINOLONE SULFATE VAGINAL (TRIMO-SAN) 0.025 % GEL Use one applicator full vaginally 2 x a week at hs   triamcinolone ointment (KENALOG) 0.1 % Apply once daily to hands as needed for hand eczema only.   No facility-administered encounter medications on file as of 08/11/2021.   Allergies  Allergen Reactions   Codeine Hives and Nausea And Vomiting   Pork-Derived Products Other (See Comments)    Migraines w/ pork and other processed meats.   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   Pain due to onychomycosis of toenails of both feet 04/22/2020   History of hypoglycemia 12/25/2019   Onychomycosis 12/25/2019   Abnormal serum cholesterol 12/25/2019   Osteopenia  07/04/2019   Obesity (BMI 30-39.9) 12/22/2017   Vitamin D deficiency 05/18/2016   Constipation 09/25/2013   Acute blood loss anemia 09/20/2013   Osteoarthritis of right hip 09/19/2013   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   Heel pain 02/16/2013   Knee pain 07/05/2012   Bilateral bunions 07/05/2012   Screening for malignant neoplasm of the cervix 07/05/2012   Healthcare maintenance 07/05/2012   Sinusitis  02/18/2012   Anxiety and depression 11/02/2011   OSA on CPAP 11/02/2011   Asthma 08/12/2010   PANIC DISORDER 05/07/2009   Essential hypertension 12/05/2008   Hypothyroidism 03/27/2008   ARTHRITIS 03/27/2008   Allergic rhinitis 08/26/2007   Social History   Socioeconomic History   Marital status: Legally Separated    Spouse name: Not on file   Number of children: Not on file   Years of education: Not on file   Highest education level: Not on file  Occupational History   Not on file  Tobacco Use   Smoking status: Former    Packs/day: 3.00    Years: 13.00    Pack years: 39.00    Types: Cigarettes    Quit date: 10/11/1974    Years since quitting: 46.8   Smokeless tobacco: Never  Vaping Use   Vaping Use: Never used  Substance and Sexual Activity   Alcohol use: No    Alcohol/week: 0.0 standard drinks   Drug use: No   Sexual activity: Not Currently  Other Topics Concern   Not on file  Social History Narrative   Not on file   Social Determinants of Health   Financial Resource Strain: Low Risk    Difficulty of Paying Living Expenses: Not hard at all  Food Insecurity: No Food Insecurity   Worried About Charity fundraiser in the Last Year: Never true   Arboriculturist in the Last Year: Never true  Transportation Needs: No Transportation Needs   Lack of Transportation (Medical): No   Lack of Transportation (Non-Medical): No  Physical Activity: Inactive   Days of Exercise per Week: 0 days   Minutes of Exercise per Session: 0 min  Stress: No Stress Concern Present   Feeling of Stress : Not at all  Social Connections: Moderately Isolated   Frequency of Communication with Friends and Family: More than three times a week   Frequency of Social Gatherings with Friends and Family: More than three times a week   Attends Religious Services: 1 to 4 times per year   Active Member of [employer] or Organizations: No   Attends Archivist Meetings: Never   Marital Status:  Separated  Intimate Partner Violence: Not At Risk   Fear of Current or Ex-Partner: No   Emotionally Abused: No   Physically Abused: No   Sexually Abused: No    Ms. Affinito family history includes Colon cancer (age of onset: 24) in her maternal grandmother; Osteoarthritis in her father, maternal grandfather, maternal grandmother, mother, and paternal grandmother; Thyroid disease in her father and mother.      Objective:    Vitals:   08/11/21 0950  BP: 140/78  Pulse: 69    Physical Exam Well-developed well-nourished elderly female/female in no acute distress.  Height, Weight, 178 BMI 30.5  HEENT; nontraumatic normocephalic, EOMI, PE R LA, sclera anicteric. Oropharynx; not examined today Neck; supple, no JVD Cardiovascular; regular rate and rhythm with S1-S2, no murmur rub or gallop Pulmonary; Clear bilaterally Abdomen; soft, nontender, nondistended, no palpable mass or  hepatosplenomegaly, bowel sounds are active Rectal; external hemorrhoidal tags, on anoscopy there is an inflamed internal hemorrhoid, loose stool in the vault Skin; benign exam, no jaundice rash or appreciable lesions Extremities; no clubbing cyanosis or edema skin warm and dry Neuro/Psych; alert and oriented x4, grossly nonfocal mood and affect appropriate        Assessment & Plan:   #54 75 year old white female with acute episode of urgency and loose stools with symptoms lasting 4 to 5 days.  She had associated bright red blood with the bowel movements noticing blood in the commode and on the tissue, no clots or melena.  She is concerned because she feels that she passed quite a bit of blood.  Symptoms resolved after about a week and at this point bowel movements are back to normal, she is not seeing any blood in the commode but has seen scant amounts on the tissue. Abdominal exam is benign, on anoscopy she has an inflamed internal hemorrhoid which I believe is the cause of her recent rectal bleeding.  I think she  probably had a mild gastroenteritis with associated loose stools which then aggravated the internal hemorrhoid.  #2 history of adenomatous colon polyps-up-to-date with colonoscopy just done May 2022 #3 hypertension 4.  Asthma 5.  Sleep apnea 6.  Osteoarthritis #7 history of panic disorder  Plan; patient has allergy to prednisone so will not use Anusol HC suppositories.  She is asked to get over-the-counter Preparation H (not on steroid compound) and use suppositories 1 per rectum nightly x7 nights, then if symptoms have completely resolved may stop, repeat course for any recurrent symptoms. We briefly discussed potential of hemorrhoidal banding if she were to develop persistent symptoms of intermittent rectal bleeding.  This is her first episode.  Have she will follow-up with Dr. Ardis Hughs or myself on an as-needed basis.    Lyrique Hakim Genia Harold PA-C 08/11/2021   Cc: Libby Maw,*

## 2021-08-12 NOTE — Progress Notes (Signed)
I agree with the above note, plan 

## 2021-09-09 ENCOUNTER — Encounter: Payer: Self-pay | Admitting: Podiatry

## 2021-09-09 ENCOUNTER — Other Ambulatory Visit: Payer: Self-pay

## 2021-09-09 ENCOUNTER — Ambulatory Visit: Payer: Medicare HMO | Admitting: Podiatry

## 2021-09-09 DIAGNOSIS — M21611 Bunion of right foot: Secondary | ICD-10-CM

## 2021-09-09 DIAGNOSIS — M79674 Pain in right toe(s): Secondary | ICD-10-CM | POA: Diagnosis not present

## 2021-09-09 DIAGNOSIS — M21612 Bunion of left foot: Secondary | ICD-10-CM

## 2021-09-09 DIAGNOSIS — B351 Tinea unguium: Secondary | ICD-10-CM | POA: Diagnosis not present

## 2021-09-09 DIAGNOSIS — M79675 Pain in left toe(s): Secondary | ICD-10-CM

## 2021-09-09 NOTE — Progress Notes (Signed)
This patient returns to the office for evaluation and treatment of long thick painful nails .  This patient is unable to trim her own nails since the patient cannot reach her feet.  Patient says the nails are painful walking and wearing his shoes.  She returns for preventive foot care services.  General Appearance  Alert, conversant and in no acute stress.  Vascular  Dorsalis pedis and posterior tibial  pulses are palpable  bilaterally.  Capillary return is within normal limits  bilaterally. Temperature is within normal limits  bilaterally.  Neurologic  Senn-Weinstein monofilament wire test within normal limits  bilaterally. Muscle power within normal limits bilaterally.  Nails Thick disfigured discolored nails with subungual debris  from hallux to fifth toes bilaterally. No evidence of bacterial infection or drainage bilaterally.  Orthopedic  No limitations of motion  feet .  No crepitus or effusions noted.  No bony pathology or digital deformities noted.  HAV  B/L.  Skin  normotropic skin with no porokeratosis noted bilaterally.  No signs of infections or ulcers noted.   Asymptomatic callus sub 5th  metabase right foot.  Onychomycosis  Pain in toes right foot  Pain in toes left foot  Debridement  of nails  1-5  B/L with a nail nipper.  Nails were then filed using a dremel tool with no incidents.    RTC 3 months    Latonya Knight DPM  

## 2021-09-10 ENCOUNTER — Ambulatory Visit: Payer: Medicare HMO | Admitting: Family Medicine

## 2021-09-14 ENCOUNTER — Telehealth: Payer: Self-pay

## 2021-09-14 ENCOUNTER — Telehealth: Payer: Self-pay | Admitting: Family Medicine

## 2021-09-14 DIAGNOSIS — E038 Other specified hypothyroidism: Secondary | ICD-10-CM

## 2021-09-14 NOTE — Telephone Encounter (Signed)
[facility] calling for permission to switch patients Levothyroxine 50mg  manufacture to ACCORD. Is this okay? Please advise.

## 2021-09-14 NOTE — Telephone Encounter (Signed)
Called pharmacy to inform that Provider would like for patient to stay on he manufacture Rx that she is currently on, will not give permission to change. Per pharmacy they will not have any more of the manufacture coming in the new manufacture is [facility]ord . Called patient to see if there was another pharmacy that she could use for preferred Rx. No answer LMTCB

## 2021-09-15 NOTE — Telephone Encounter (Signed)
Called to check with on possibly switching pharmacies due to [facility] no longer carrying manufacture band. No answer LMTCB

## 2021-09-17 MED ORDER — LEVOTHYROXINE SODIUM 50 MCG PO TABS: 50.0000 ug | ORAL_TABLET | Freq: Every day | ORAL | 3 refills | Status: DC

## 2021-09-17 NOTE — Addendum Note (Signed)
Addended by: Lynda Rainwater on: 09/17/2021 12:32 PM   Modules accepted: Orders

## 2021-09-17 NOTE — Telephone Encounter (Signed)
Spoke with patient had Rx switched to [place] on [address] per patient she will pick up.

## 2021-09-18 ENCOUNTER — Other Ambulatory Visit: Payer: Self-pay

## 2021-09-21 ENCOUNTER — Ambulatory Visit (INDEPENDENT_AMBULATORY_CARE_PROVIDER_SITE_OTHER): Payer: Medicare HMO

## 2021-09-21 ENCOUNTER — Other Ambulatory Visit: Payer: Self-pay

## 2021-09-21 ENCOUNTER — Ambulatory Visit (INDEPENDENT_AMBULATORY_CARE_PROVIDER_SITE_OTHER): Payer: Medicare HMO | Admitting: Family Medicine

## 2021-09-21 ENCOUNTER — Encounter: Payer: Self-pay | Admitting: Family Medicine

## 2021-09-21 VITALS — BP 118/68 | HR 53 | Temp 97.5°F | Ht 64.0 in | Wt 179.0 lb

## 2021-09-21 DIAGNOSIS — D649 Anemia, unspecified: Secondary | ICD-10-CM | POA: Diagnosis not present

## 2021-09-21 DIAGNOSIS — E039 Hypothyroidism, unspecified: Secondary | ICD-10-CM | POA: Diagnosis not present

## 2021-09-21 DIAGNOSIS — E559 Vitamin D deficiency, unspecified: Secondary | ICD-10-CM

## 2021-09-21 DIAGNOSIS — E538 Deficiency of other specified B group vitamins: Secondary | ICD-10-CM

## 2021-09-21 DIAGNOSIS — M25512 Pain in left shoulder: Secondary | ICD-10-CM

## 2021-09-21 DIAGNOSIS — I10 Essential (primary) hypertension: Secondary | ICD-10-CM

## 2021-09-21 DIAGNOSIS — J4520 Mild intermittent asthma, uncomplicated: Secondary | ICD-10-CM | POA: Diagnosis not present

## 2021-09-21 DIAGNOSIS — M79622 Pain in left upper arm: Secondary | ICD-10-CM | POA: Diagnosis not present

## 2021-09-21 DIAGNOSIS — M25712 Osteophyte, left shoulder: Secondary | ICD-10-CM | POA: Diagnosis not present

## 2021-09-21 DIAGNOSIS — M19012 Primary osteoarthritis, left shoulder: Secondary | ICD-10-CM | POA: Diagnosis not present

## 2021-09-21 LAB — BASIC METABOLIC PANEL
BUN: 15 mg/dL (ref 6–23)
CO2: 27 mEq/L (ref 19–32)
Calcium: 9.8 mg/dL (ref 8.4–10.5)
Chloride: 104 mEq/L (ref 96–112)
Creatinine, Ser: 0.81 mg/dL (ref 0.40–1.20)
GFR: 70.46 mL/min (ref >60.00)
Glucose, Bld: 84 mg/dL (ref 70–99)
Potassium: 4.2 mEq/L (ref 3.5–5.1)
Sodium: 141 mEq/L (ref 135–145)

## 2021-09-21 LAB — VITAMIN D 25 HYDROXY (VIT D DEFICIENCY, FRACTURES): VITD: 43.42 ng/mL (ref 30.00–100.00)

## 2021-09-21 LAB — CBC
HCT: 39.2 % (ref 36.0–46.0)
Hemoglobin: 12.8 g/dL (ref 12.0–15.0)
MCHC: 32.8 g/dL (ref 30.0–36.0)
MCV: 93.9 fl (ref 78.0–100.0)
Platelets: 208 10*3/uL (ref 150.0–400.0)
RBC: 4.17 Mil/uL (ref 3.87–5.11)
RDW: 13.7 % (ref 11.5–15.5)
WBC: 5 10*3/uL (ref 4.0–10.5)

## 2021-09-21 LAB — B12 AND FOLATE PANEL
Folate: >23.4 ng/mL (ref >5.9)
Vitamin B-12: 429 pg/mL (ref 211–911)

## 2021-09-21 LAB — TSH: TSH: 1.51 u[IU]/mL (ref 0.35–5.50)

## 2021-09-21 MED ORDER — ALBUTEROL SULFATE HFA 108 (90 BASE) MCG/ACT IN AERS
2.0000 | INHALATION_SPRAY | Freq: Four times a day (QID) | RESPIRATORY_TRACT | 0 refills | Status: AC | PRN
Start: 2021-09-21 — End: ?

## 2021-09-21 NOTE — Progress Notes (Signed)
Established Patient Office Visit  Subjective:  Patient ID: [name], female    DOB: 10-20-1945  Age: 75 y.o. MRN: [id number]  CC:  Chief Complaint  Patient presents with   Follow-up    6 month follow, patient fasting for lab.     HPI [name] presents for follow-up of hypertension, hypothyroidism, vitamin D deficiency and macrocytic anemia.  She has been taking a multivitamin.  Continues with vitamin D supplementation.  Continues with levothyroxine.  Blood pressures controlled with Zestril 20 daily at night.  She has been having some problems with her right shoulder.  There is decreased range of motion.  Right-hand-dominant.  No injury currently receiving Synvisc injections to her left knee prior to a possible knee replacement in the future.  Uses albuterol inhaler rarely for tightness usually associated with a bronchitis or viral syndrome.  Past Medical History:  Diagnosis Date   Allergy    Arthritis    Asthma    mild   Blood transfusion without reported diagnosis    H/O bronchitis    Hemorrhoid    Hx of blood clots 1967   from birth control in LUE   Hypertension    Hypoglycemia    Hypothyroidism    Migraine    from food   Osteopenia 07/04/2019   Pneumonia    hx of   Seasonal allergies    Sleep apnea    CPAP   UTI (urinary tract infection)     Past Surgical History:  Procedure Laterality Date   CATARACT EXTRACTION     rt eye   COLONOSCOPY  05/13/2008   Ardis Hughs   detatched retina  2003   rt eye   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   Procedure: RIGHT TOTAL HIP ARTHROPLASTY;  Surgeon: Tobi Bastos, MD;  Location: [facility];  Service: Orthopedics;  Laterality: Right;    Family History  Problem Relation Age of Onset   Thyroid disease Mother    Osteoarthritis Mother    Thyroid disease Father    Osteoarthritis Father    Osteoarthritis Maternal Grandmother    Colon cancer Maternal Grandmother 39   Osteoarthritis Maternal Grandfather     Osteoarthritis Paternal Grandmother    Esophageal cancer Neg Hx    Rectal cancer Neg Hx    Stomach cancer Neg Hx    Colon polyps Neg Hx     Social History   Socioeconomic History   Marital status: Legally Separated    Spouse name: Not on file   Number of children: Not on file   Years of education: Not on file   Highest education level: Not on file  Occupational History   Not on file  Tobacco Use   Smoking status: Former    Packs/day: 3.00    Years: 13.00    Pack years: 39.00    Types: Cigarettes    Quit date: 10/11/1974    Years since quitting: 46.9   Smokeless tobacco: Never  Vaping Use   Vaping Use: Never used  Substance and Sexual Activity   Alcohol use: No    Alcohol/week: 0.0 standard drinks   Drug use: No   Sexual activity: Not Currently  Other Topics Concern   Not on file  Social History Narrative   Not on file   Social Determinants of Health   Financial Resource Strain: Low Risk    Difficulty of Paying Living Expenses: Not hard at all  Food Insecurity: No Food Insecurity   Worried  About Running Out of Food in the Last Year: Never true   Ran Out of Food in the Last Year: Never true  Transportation Needs: No Transportation Needs   Lack of Transportation (Medical): No   Lack of Transportation (Non-Medical): No  Physical Activity: Inactive   Days of Exercise per Week: 0 days   Minutes of Exercise per Session: 0 min  Stress: No Stress Concern Present   Feeling of Stress : Not at all  Social Connections: Moderately Isolated   Frequency of Communication with Friends and Family: More than three times a week   Frequency of Social Gatherings with Friends and Family: More than three times a week   Attends Religious Services: 1 to 4 times per year   Active Member of [employer] or Organizations: No   Attends Archivist Meetings: Never   Marital Status: Separated  Intimate Partner Violence: Not At Risk   Fear of Current or Ex-Partner: No   Emotionally  Abused: No   Physically Abused: No   Sexually Abused: No    Outpatient Medications Prior to Visit  Medication Sig Dispense Refill   acetaminophen (TYLENOL) 500 MG tablet Take 500-1,000 mg by mouth as needed for mild pain or moderate pain.     Cholecalciferol (VITAMIN D) 2000 UNITS CAPS Take 2,000 Units by mouth daily.     fexofenadine (ALLEGRA) 60 MG tablet Take 60 mg by mouth daily.     levothyroxine (SYNTHROID) 50 MCG tablet Take 1 tablet (50 mcg total) by mouth daily. 90 tablet 3   lisinopril (ZESTRIL) 20 MG tablet TAKE 1 TABLET BY MOUTH AT BEDTIME 90 tablet 1   Multiple Vitamins-Minerals (MULTIVITAMIN WITH MINERALS) tablet Take 1 tablet by mouth daily.     nystatin (MYCOSTATIN/NYSTOP) powder Apply topically 2 (two) times daily as needed. Beneath both breasts 15 g 0   OXYQUINOLONE SULFATE VAGINAL (TRIMO-SAN) 0.025 % GEL Use one applicator full vaginally 2 x a week at hs 113.4 g 1   triamcinolone ointment (KENALOG) 0.1 % Apply once daily to hands as needed for hand eczema only. 30 g 2   albuterol (PROAIR HFA) 108 (90 Base) MCG/ACT inhaler Inhale 2 puffs into the lungs every 6 (six) hours as needed for wheezing or shortness of breath. 1 each 0   Lactobacillus-Inulin (CULTURELLE DIGESTIVE DAILY PO) Take by mouth. (Patient not taking: Reported on 09/21/2021)     No facility-administered medications prior to visit.    Allergies  Allergen Reactions   Codeine Hives and Nausea And Vomiting   Pork-Derived Products Other (See Comments)    Migraines w/ pork and other processed meats.   Prednisone Other (See Comments)    Causes more pain    ROS Review of Systems  Constitutional:  Negative for diaphoresis, fatigue, fever and unexpected weight change.  HENT: Negative.    Eyes:  Negative for photophobia and visual disturbance.  Respiratory: Negative.    Cardiovascular: Negative.   Gastrointestinal: Negative.   Endocrine: Negative for polyphagia and polyuria.  Genitourinary: Negative.    Musculoskeletal:  Positive for arthralgias and gait problem.  Neurological:  Negative for speech difficulty, weakness and light-headedness.  Hematological:  Does not bruise/bleed easily.  Psychiatric/Behavioral: Negative.       Objective:    Physical Exam Vitals and nursing note reviewed.  Constitutional:      General: She is not in acute distress.    Appearance: Normal appearance. She is not ill-appearing, toxic-appearing or diaphoretic.  HENT:     Head:  Normocephalic and atraumatic.     Right Ear: External ear normal.     Left Ear: External ear normal.     Mouth/Throat:     Mouth: Mucous membranes are moist.     Pharynx: Oropharynx is clear. No oropharyngeal exudate or posterior oropharyngeal erythema.  Eyes:     General: No scleral icterus.       Right eye: No discharge.        Left eye: No discharge.     Extraocular Movements: Extraocular movements intact.     Conjunctiva/sclera: Conjunctivae normal.     Pupils: Pupils are equal, round, and reactive to light.  Cardiovascular:     Rate and Rhythm: Normal rate and regular rhythm.  Pulmonary:     Effort: Pulmonary effort is normal.     Breath sounds: Normal breath sounds.  Abdominal:     General: Bowel sounds are normal.  Musculoskeletal:     Left shoulder: Deformity, tenderness and bony tenderness present. Decreased range of motion.       Arms:     Cervical back: No rigidity or tenderness.     Right lower leg: No edema.     Left lower leg: No edema.  Lymphadenopathy:     Cervical: No cervical adenopathy.  Skin:    General: Skin is warm and dry.  Neurological:     Mental Status: She is alert and oriented to person, place, and time.  Psychiatric:        Mood and Affect: Mood normal.        Behavior: Behavior normal.    BP 118/68 (BP Location: Right Arm, Patient Position: Sitting, Cuff Size: Normal)   Pulse (!) 53   Temp (!) 97.5 F (36.4 C) (Temporal)   Ht 5\' 4"  (1.626 m)   Wt 179 lb (81.2 kg)   SpO2 99%    BMI 30.73 kg/m  Wt Readings from Last 3 Encounters:  09/21/21 179 lb (81.2 kg)  08/11/21 178 lb (80.7 kg)  06/03/21 188 lb 9.6 oz (85.5 kg)     Health Maintenance Due  Topic Date Due   Zoster Vaccines- Shingrix (1 of 2) Never done   TETANUS/TDAP  03/27/2018    There are no preventive care reminders to display for this patient.  Lab Results  Component Value Date   TSH 3.03 12/10/2020   Lab Results  Component Value Date   WBC 4.3 03/12/2021   HGB 11.7 (L) 03/12/2021   HCT 35.1 (L) 03/12/2021   MCV 93.1 03/12/2021   PLT 232.0 03/12/2021   Lab Results  Component Value Date   NA 143 03/12/2021   K 4.2 03/12/2021   CO2 28 03/12/2021   GLUCOSE 82 03/12/2021   BUN 10 03/12/2021   CREATININE 0.80 03/12/2021   BILITOT 0.4 03/12/2021   ALKPHOS 60 03/12/2021   AST 17 03/12/2021   ALT 7 03/12/2021   PROT 6.4 03/12/2021   ALBUMIN 3.9 03/12/2021   CALCIUM 9.3 03/12/2021   GFR 71.78 03/12/2021   Lab Results  Component Value Date   CHOL 163 03/12/2021   Lab Results  Component Value Date   HDL 68.90 03/12/2021   Lab Results  Component Value Date   LDLCALC 63 03/12/2021   Lab Results  Component Value Date   TRIG 158.0 (H) 03/12/2021   Lab Results  Component Value Date   CHOLHDL 2 03/12/2021   Lab Results  Component Value Date   HGBA1C 5.8 12/10/2020  Assessment & Plan:   Problem List Items Addressed This Visit       Cardiovascular and Mediastinum   Essential hypertension - Primary   Relevant Orders   Basic metabolic panel     Respiratory   Asthma   Relevant Medications   albuterol (PROAIR HFA) 108 (90 Base) MCG/ACT inhaler     Endocrine   Hypothyroidism   Relevant Orders   TSH     Other   Vitamin D deficiency   Relevant Orders   VITAMIN D 25 Hydroxy (Vit-D Deficiency, Fractures)   Left shoulder pain   Relevant Orders   DG Shoulder Left   DG Humerus Left   Normocytic anemia   Relevant Orders   CBC   B12 and Folate Panel   B12  deficiency   Relevant Orders   B12 and Folate Panel    Meds ordered this encounter  Medications   albuterol (PROAIR HFA) 108 (90 Base) MCG/ACT inhaler    Sig: Inhale 2 puffs into the lungs every 6 (six) hours as needed for wheezing or shortness of breath.    Dispense:  1 each    Refill:  0    Follow-up: Return in about 6 months (around 03/22/2022).  Continue all medicines as above.  Would possibly like to see a sports medicine doctor at [facility].  Libby Maw, MD

## 2021-10-30 DIAGNOSIS — M170 Bilateral primary osteoarthritis of knee: Secondary | ICD-10-CM | POA: Diagnosis not present

## 2021-10-30 DIAGNOSIS — M1712 Unilateral primary osteoarthritis, left knee: Secondary | ICD-10-CM | POA: Diagnosis not present

## 2021-11-26 ENCOUNTER — Telehealth: Payer: Self-pay | Admitting: Family Medicine

## 2021-11-26 NOTE — Telephone Encounter (Signed)
Patient verbally understood she does not have to have a prescription to have shingles vaccine.

## 2021-11-26 NOTE — Telephone Encounter (Signed)
Pt called and is requesting that a prescription for the shingles shot that takes 2 doses be sent to [facility] on [address]. Call back is [phone]

## 2021-12-06 ENCOUNTER — Other Ambulatory Visit: Payer: Self-pay | Admitting: Family Medicine

## 2021-12-06 DIAGNOSIS — I10 Essential (primary) hypertension: Secondary | ICD-10-CM

## 2021-12-16 ENCOUNTER — Other Ambulatory Visit: Payer: Self-pay

## 2021-12-16 ENCOUNTER — Ambulatory Visit: Payer: Medicare HMO | Admitting: Podiatry

## 2021-12-16 ENCOUNTER — Encounter: Payer: Self-pay | Admitting: Podiatry

## 2021-12-16 DIAGNOSIS — M79675 Pain in left toe(s): Secondary | ICD-10-CM | POA: Diagnosis not present

## 2021-12-16 DIAGNOSIS — M79674 Pain in right toe(s): Secondary | ICD-10-CM | POA: Diagnosis not present

## 2021-12-16 DIAGNOSIS — B351 Tinea unguium: Secondary | ICD-10-CM

## 2021-12-16 DIAGNOSIS — M21612 Bunion of left foot: Secondary | ICD-10-CM

## 2021-12-16 NOTE — Progress Notes (Signed)
This patient returns to the office for evaluation and treatment of long thick painful nails .  This patient is unable to trim her own nails since the patient cannot reach her feet.  Patient says the nails are painful walking and wearing his shoes.  She returns for preventive foot care services.  General Appearance  Alert, conversant and in no acute stress.  Vascular  Dorsalis pedis and posterior tibial  pulses are palpable  bilaterally.  Capillary return is within normal limits  bilaterally. Temperature is within normal limits  bilaterally.  Neurologic  Senn-Weinstein monofilament wire test within normal limits  bilaterally. Muscle power within normal limits bilaterally.  Nails Thick disfigured discolored nails with subungual debris  from hallux to fifth toes bilaterally. No evidence of bacterial infection or drainage bilaterally.  Orthopedic  No limitations of motion  feet .  No crepitus or effusions noted.  No bony pathology or digital deformities noted.  HAV  B/L.  Skin  normotropic skin with no porokeratosis noted bilaterally.  No signs of infections or ulcers noted.   Asymptomatic callus sub 5th  metabase right foot.  Onychomycosis  Pain in toes right foot  Pain in toes left foot  Debridement  of nails  1-5  B/L with a nail nipper.  Nails were then filed using a dremel tool with no incidents.    RTC 3 months    Minoru Chap DPM  

## 2021-12-28 ENCOUNTER — Telehealth: Payer: Self-pay | Admitting: Family Medicine

## 2021-12-28 NOTE — Telephone Encounter (Signed)
Left message for patient to call back and schedule Medicare Annual Wellness Visit (AWV) in office.   If not able to come in office, please offer to do virtually or by telephone.  Left office number and my jabber #[phone].  Last AWV:12/09/2020   Please schedule at anytime with Nurse Health Advisor.  

## 2021-12-29 ENCOUNTER — Other Ambulatory Visit: Payer: Self-pay

## 2021-12-29 ENCOUNTER — Ambulatory Visit (INDEPENDENT_AMBULATORY_CARE_PROVIDER_SITE_OTHER): Payer: Medicare HMO | Admitting: Family Medicine

## 2021-12-29 VITALS — BP 118/66 | HR 70 | Temp 97.9°F | Ht 64.0 in | Wt 182.6 lb

## 2021-12-29 DIAGNOSIS — J301 Allergic rhinitis due to pollen: Secondary | ICD-10-CM | POA: Diagnosis not present

## 2021-12-29 MED ORDER — FLUTICASONE PROPIONATE 50 MCG/ACT NA SUSP: 2.0000 | Freq: Every day | NASAL | 6 refills | Status: AC

## 2021-12-29 NOTE — Progress Notes (Signed)
?[facility] ?[facility] ?[place] ?[place] [address] ?Dept: [phone] ?Dept Fax: [phone] ? ?Office Visit ? ?Subjective:  ? ? Patient ID: [name], female    DOB: 09-24-1946, 76 y.o..   MRN: [id number] ? ?Chief Complaint  ?Patient presents with  ? Acute Visit  ?  Co having sinus drainage, sneezing, coughing, throat scratchy, and chills x 1 day.  Has taken Tylenol.   ? ? ?History of Present Illness: ? ?Patient is in today with a 1-day history of nasal congestion, rhinorrhea, sinus pressure, sore throat and cough. She also admits to some chills. She has taken some Tylenol. She has a history of allergic rhinitis and is on Allegra. She also has a history of asthma and id use her inhaler once today. [name] notes in the past that if she didn't get on top of a similar illness, she ended up with bronchitis. ? ?Past Medical History: ?Patient Active Problem List  ? Diagnosis Date Noted  ? Normocytic anemia 09/21/2021  ? B12 deficiency 09/21/2021  ? Uterine prolapse 12/09/2020  ? Urge incontinence of urine 12/09/2020  ? Left shoulder pain 12/09/2020  ? 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  ? Pain due to onychomycosis of toenails of both feet 04/22/2020  ? History of hypoglycemia 12/25/2019  ? Onychomycosis 12/25/2019  ? Abnormal serum cholesterol 12/25/2019  ? Osteopenia 07/04/2019  ? Obesity (BMI 30-39.9) 12/22/2017  ? Vitamin D deficiency 05/18/2016  ? Constipation 09/25/2013  ? Acute blood loss anemia 09/20/2013  ? Osteoarthritis of right hip 09/19/2013  ? 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 Heel pain 02/16/2013  ? Knee pain 07/05/2012  ? Bilateral bunions 07/05/2012  ? Screening for malignant neoplasm of the cervix 07/05/2012  ? Healthcare maintenance 07/05/2012  ? Sinusitis 02/18/2012  ? Anxiety and depression 11/02/2011  ? OSA on CPAP 11/02/2011  ? Asthma 08/12/2010  ? PANIC DISORDER 05/07/2009  ? Essential hypertension 12/05/2008  ? Hypothyroidism  03/27/2008  ? ARTHRITIS 03/27/2008  ? Allergic rhinitis 08/26/2007  ? ?Past Surgical History:  ?Procedure Laterality Date  ? CATARACT EXTRACTION    ? rt eye  ? COLONOSCOPY  05/13/2008  ? Ardis Hughs  ? detatched retina  2003  ? rt eye  ? 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  ? Procedure: RIGHT TOTAL HIP ARTHROPLASTY;  Surgeon: Tobi Bastos, MD;  Location: [facility];  Service: Orthopedics;  Laterality: Right;  ? ?Family History  ?Problem Relation Age of Onset  ? Thyroid disease Mother   ? Osteoarthritis Mother   ? Thyroid disease Father   ? Osteoarthritis Father   ? Osteoarthritis Maternal Grandmother   ? Colon cancer Maternal Grandmother 64  ? Osteoarthritis Maternal Grandfather   ? Osteoarthritis Paternal Grandmother   ? Esophageal cancer Neg Hx   ? Rectal cancer Neg Hx   ? Stomach cancer Neg Hx   ? Colon polyps Neg Hx   ? ?Outpatient Medications Prior to Visit  ?Medication Sig Dispense Refill  ? acetaminophen (TYLENOL) 500 MG tablet Take 500-1,000 mg by mouth as needed for mild pain or moderate pain.    ? albuterol (PROAIR HFA) 108 (90 Base) MCG/ACT inhaler Inhale 2 puffs into the lungs every 6 (six) hours as needed for wheezing or shortness of breath. 1 each 0  ? Cholecalciferol (VITAMIN D) 2000 UNITS CAPS Take 2,000 Units by mouth daily.    ? diclofenac Sodium (VOLTAREN) 1 % GEL Apply topically 4 (four) times daily.    ?  fexofenadine (ALLEGRA) 60 MG tablet Take 60 mg by mouth daily.    ? levothyroxine (SYNTHROID) 50 MCG tablet Take 1 tablet (50 mcg total) by mouth daily. 90 tablet 3  ? lisinopril (ZESTRIL) 20 MG tablet TAKE 1 TABLET BY MOUTH AT BEDTIME 90 tablet 0  ? Multiple Vitamins-Minerals (MULTIVITAMIN WITH MINERALS) tablet Take 1 tablet by mouth daily.    ? nystatin (MYCOSTATIN/NYSTOP) powder Apply topically 2 (two) times daily as needed. Beneath both breasts 15 g 0  ? OXYQUINOLONE SULFATE VAGINAL (TRIMO-SAN) 0.025 % GEL Use one applicator full vaginally 2 x a week at hs 113.4 g 1  ? triamcinolone ointment  (KENALOG) 0.1 % Apply once daily to hands as needed for hand eczema only. 30 g 2  ? Lactobacillus-Inulin (CULTURELLE DIGESTIVE DAILY PO) Take by mouth. (Patient not taking: Reported on 09/21/2021)    ? ?No facility-administered medications prior to visit.  ? ?Allergies  ?Allergen Reactions  ? Codeine Hives and Nausea And Vomiting  ? Pork-Derived Products Other (See Comments)  ?  Migraines w/ pork and other processed meats.  ? Prednisone Other (See Comments)  ?  Causes more pain  ? ?Objective:  ? ?Today's Vitals  ? 12/29/21 1526  ?BP: 118/66  ?Pulse: 70  ?Temp: 97.9 ?F (36.6 ?C)  ?TempSrc: Temporal  ?SpO2: 96%  ?Weight: 182 lb 9.6 oz (82.8 kg)  ?Height: '5\' 4"'$  (1.626 m)  ? ?Body mass index is 31.34 kg/m?.  ? ?General: Well developed, well nourished. No acute distress. ?HEENT: Normocephalic, non-traumatic. Conjunctiva clear. External ears normal. The  ? left TM is mildly bulging with clear fluid behind the drum. Nasal mucosa moderately  ? swollen with clear rhinorrhea. Mild tenderness on percussion over maxillary sinuses.  ? Mucous membranes moist. Mild mucous streaking of posterior oropharynx clear. Neck: Supple. No lymphadenopathy. No thyromegaly. ?Lungs: Clear to auscultation bilaterally. No wheezing, rales or rhonchi. ?CV: RRR with a ii/Vi systolic murmur. Pulses 2+ bilaterally. ?Psych: Alert and oriented. Normal mood and affect. ? ?Health Maintenance Due  ?Topic Date Due  ? Zoster Vaccines- Shingrix (1 of 2) Never done  ? TETANUS/TDAP  03/27/2018  ?   ?Assessment & Plan:  ? ?1. Seasonal allergic rhinitis due to pollen ?This appears to be more of a flare of seasonal allergies, though I can't completely exclude a viral component. I recommend [name] continue to take her Allegra. I will add a nasal steroid spray. She should use her labuterol inhaler 3-4 times a day for the next 2 days. Discussed her getting rest, pushing fluids, and OTC medications as needed for symptom relief. Recommend hot tea with honey for sore  throat and cough symptoms. Follow-up if needed for worsening or persistent symptoms. ? ?- fluticasone (FLONASE) 50 MCG/ACT nasal spray; Place 2 sprays into both nostrils daily.  Dispense: 16 g; Refill: 6 ? ? ?Return if symptoms worsen or fail to improve.  ? ?Haydee Salter, MD ?

## 2021-12-29 NOTE — Patient Instructions (Signed)
Use Flonase spray daily ?Continue Allegra ?Drink hot tea with honey 3-4 times a day ?Use your albuterol inhaler 3-4 times a day. ?

## 2021-12-30 DIAGNOSIS — G4733 Obstructive sleep apnea (adult) (pediatric): Secondary | ICD-10-CM | POA: Diagnosis not present

## 2022-01-13 IMAGING — DX DG HUMERUS 2V *L*
2 series · 2 of 2 positions shown · non-contrast
Comparison: None.

CLINICAL DATA: Left shoulder and upper arm pain. Limited range of
motion. Point tender to palpation proximal.

EXAM:
LEFT HUMERUS - 2+ VIEW

[humerus ap]
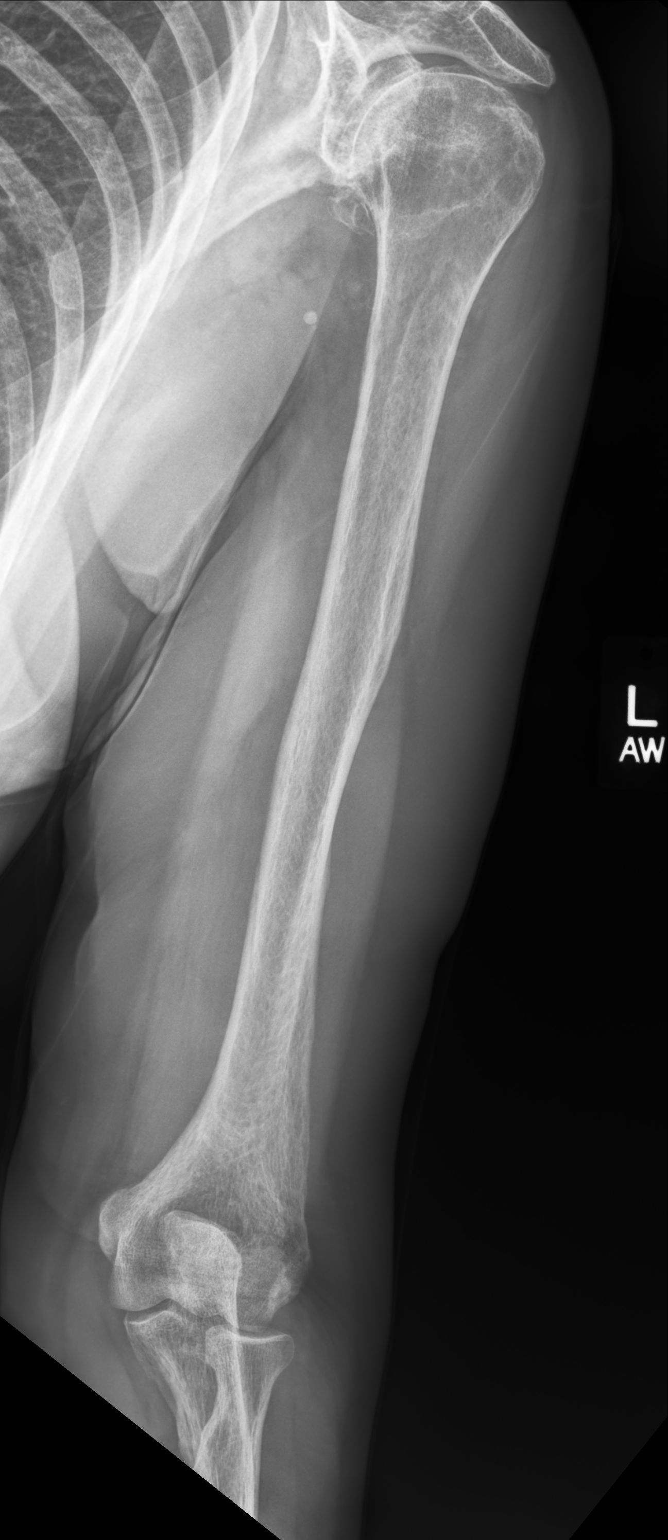

[humerus lat]
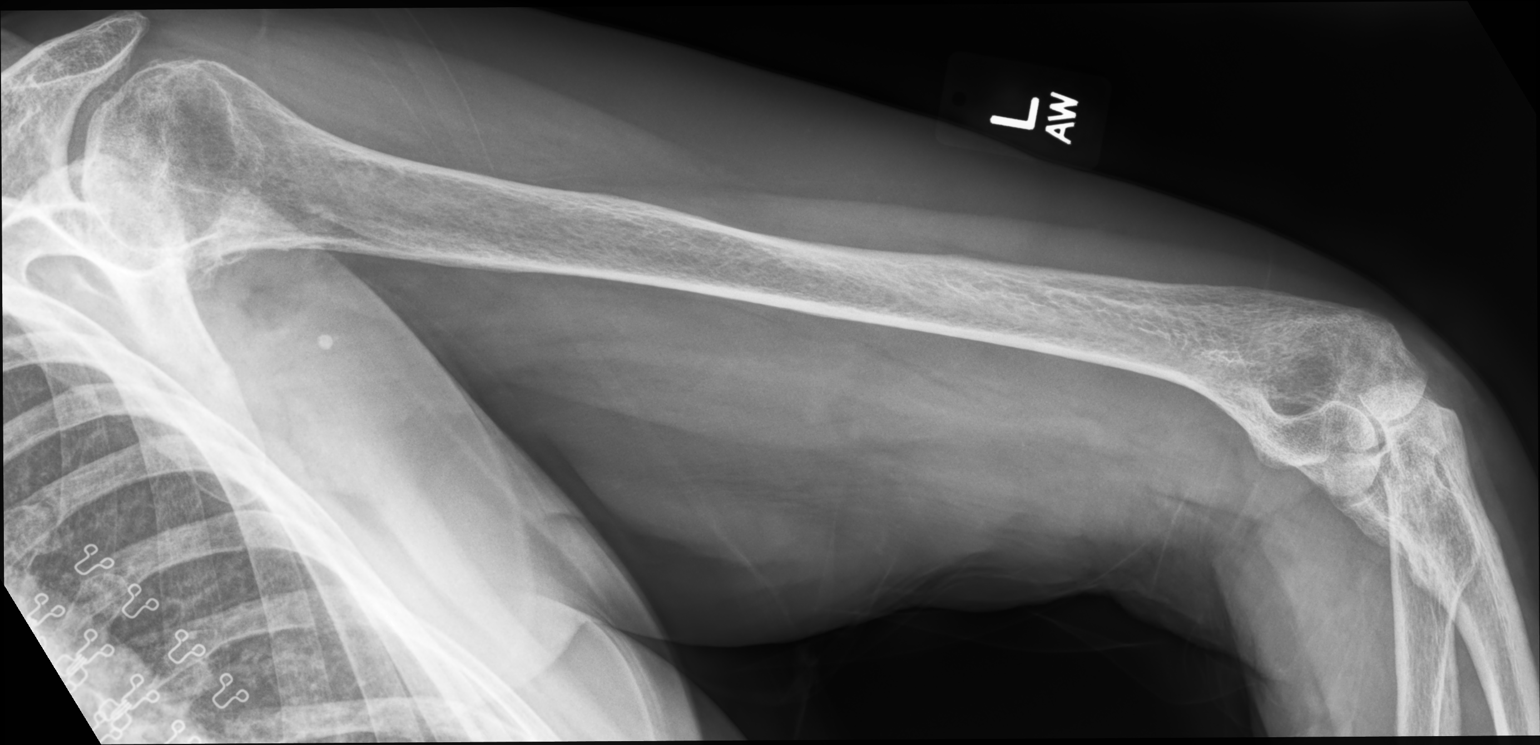

[2 of 2 positions shown; findings below may reference images not displayed]

FINDINGS: Shoulder joint is better assessed on concurrent shoulder exam. The
distal humerus is intact. Elbow alignment is maintained. No evidence
of focal bone lesion or bone destruction.
IMPRESSION: Left shoulder arthropathy is better assessed on concurrent shoulder
exam. Mid and distal humerus are intact.

## 2022-01-13 IMAGING — DX DG SHOULDER 2+V*L*
3 series · 3 of 3 positions shown · non-contrast
Comparison: None.

CLINICAL DATA: Left shoulder pain with decreased range of motion.

EXAM:
LEFT SHOULDER - 2+ VIEW

[shoulder (grashey) ap]
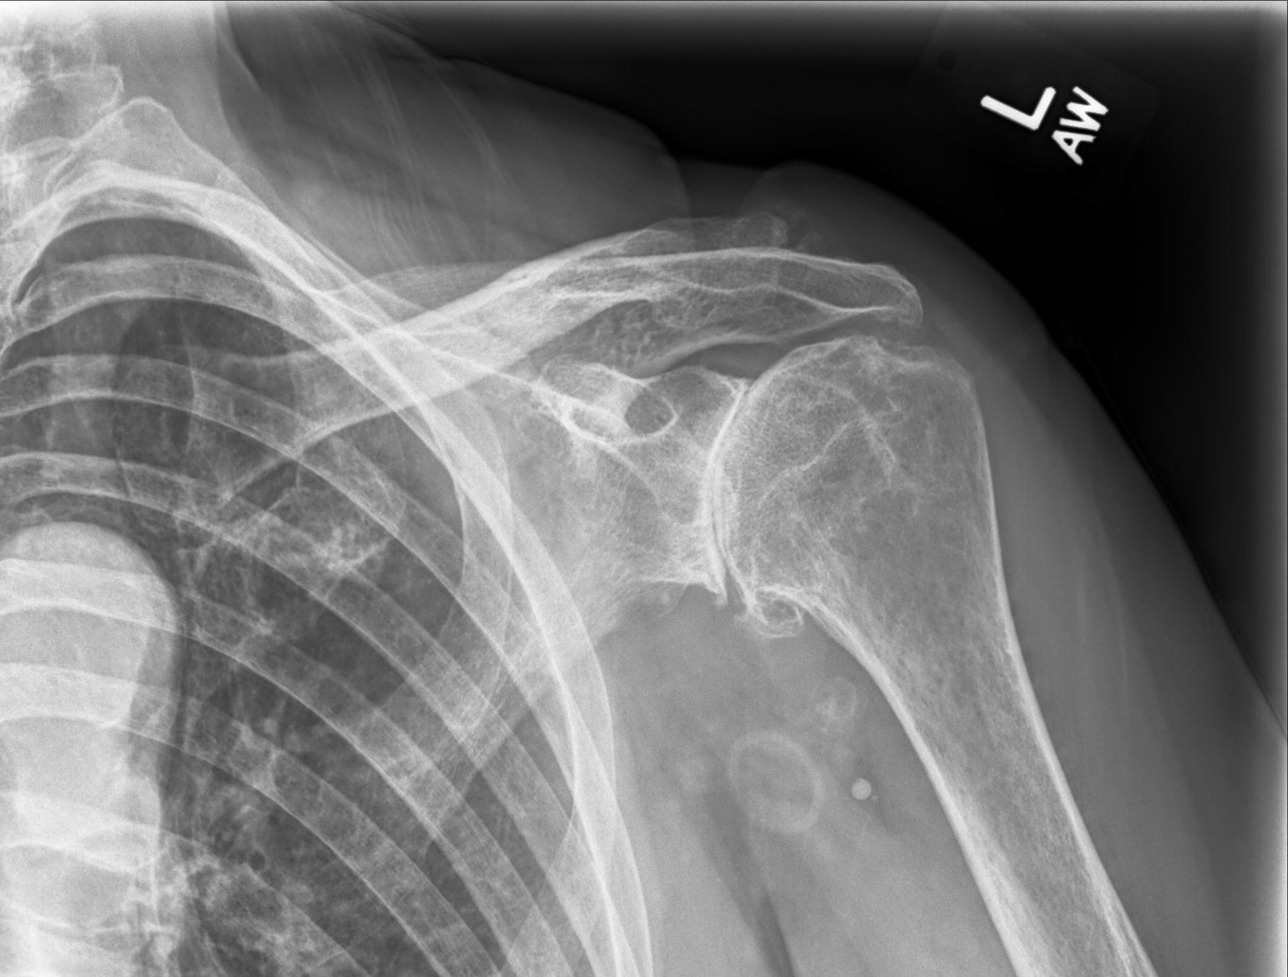

[shoulder y view]
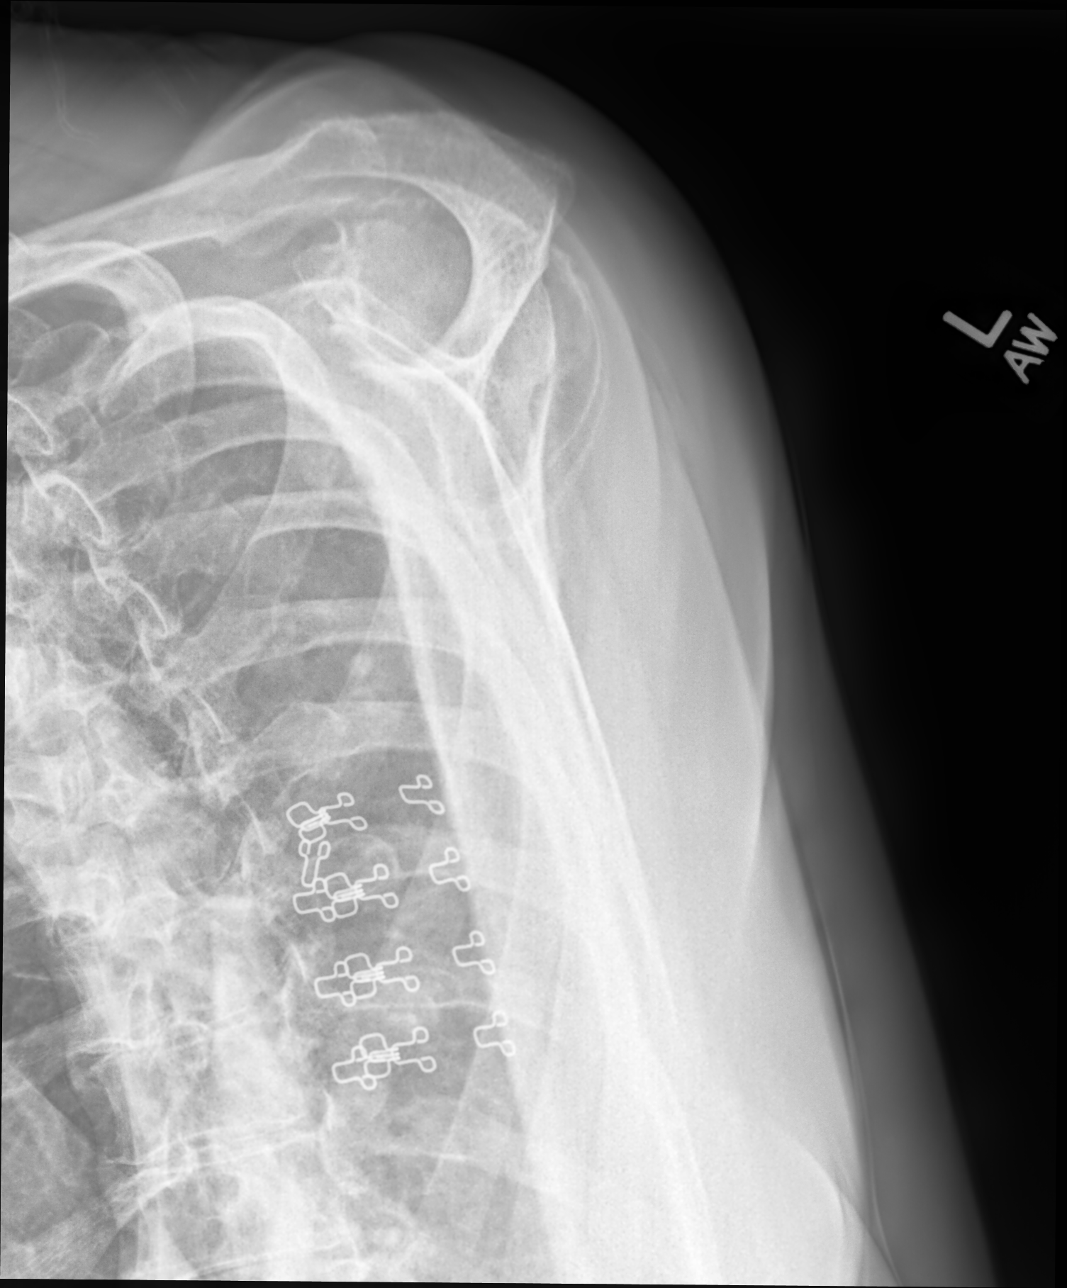

[shoulder ap]
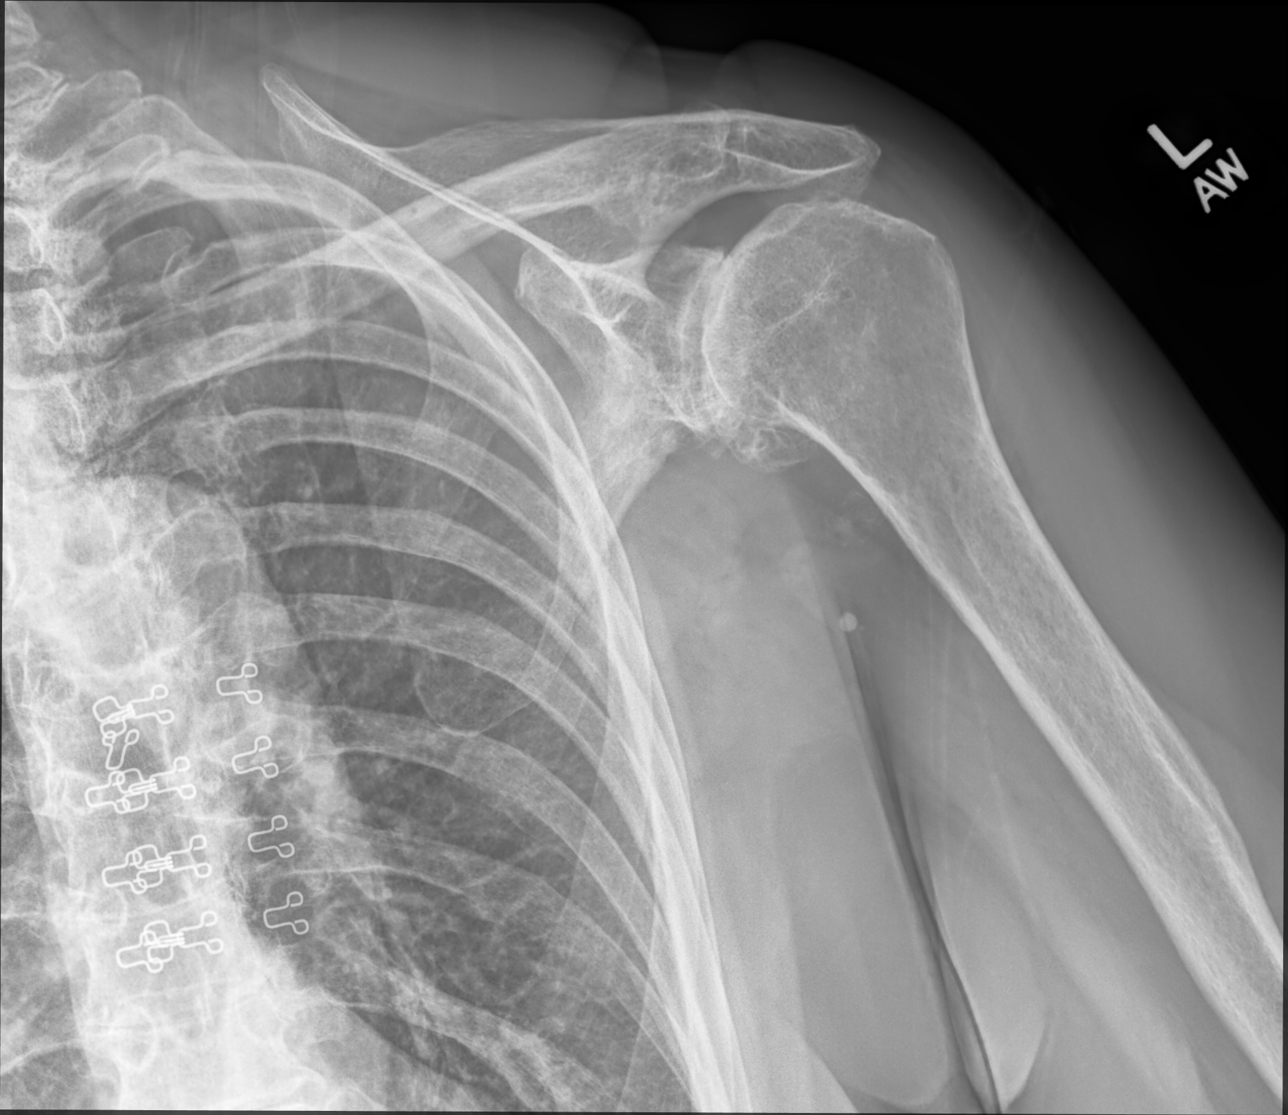

[3 of 3 positions shown; findings below may reference images not displayed]

FINDINGS: Advanced glenohumeral osteoarthritis. Near complete joint space loss
with subchondral cystic change, sclerosis, and inferior osteophytes.
There is a probable 10 mm ossified intra-articular body in the
axillary recess. Mild to moderate acromioclavicular degenerative
change. Small subacromial spur. Subcortical cystic change in the
lateral humeral head. No soft tissue calcifications in the region of
the rotator cuff. No erosions or bone destruction.
IMPRESSION: 1. Advanced glenohumeral osteoarthritis. Probable ossified
intra-articular body in the axillary recess.
2. Mild to moderate acromioclavicular degenerative change. Small
subacromial spur.
3. Subcortical cystic change in the lateral humeral head suggestive
of rotator cuff arthropathy.

## 2022-01-30 DIAGNOSIS — G4733 Obstructive sleep apnea (adult) (pediatric): Secondary | ICD-10-CM | POA: Diagnosis not present

## 2022-02-02 ENCOUNTER — Telehealth: Payer: Self-pay | Admitting: Family Medicine

## 2022-02-02 NOTE — Telephone Encounter (Signed)
Left message for patient to call back and schedule Medicare Annual Wellness Visit (AWV).   Please offer to do virtually or by telephone.  Left office number and my jabber #[phone].  Last AWV:12/09/2020  Please schedule at anytime with Nurse Health Advisor.   

## 2022-02-19 ENCOUNTER — Telehealth: Payer: Self-pay | Admitting: Family Medicine

## 2022-02-19 NOTE — Telephone Encounter (Signed)
Left message for patient to call back and schedule Medicare Annual Wellness Visit (AWV).   Please offer to do virtually or by telephone.  Left office number and my jabber #[phone].  Last AWV:12/09/2020  Please schedule at anytime with Nurse Health Advisor.   

## 2022-02-25 DIAGNOSIS — M1712 Unilateral primary osteoarthritis, left knee: Secondary | ICD-10-CM | POA: Diagnosis not present

## 2022-03-01 ENCOUNTER — Telehealth: Payer: Self-pay | Admitting: Pulmonary Disease

## 2022-03-01 DIAGNOSIS — G4733 Obstructive sleep apnea (adult) (pediatric): Secondary | ICD-10-CM | POA: Diagnosis not present

## 2022-03-01 NOTE — Telephone Encounter (Signed)
Called patient and she is wondering about the pressures and settings with her machine. She states she feels like she is not getting good quality of sleep and would like to know if the setting for her machine be changed.   Dr Elsworth Soho please advise

## 2022-03-02 ENCOUNTER — Ambulatory Visit (INDEPENDENT_AMBULATORY_CARE_PROVIDER_SITE_OTHER): Payer: Medicare HMO

## 2022-03-02 DIAGNOSIS — Z0000 Encounter for general adult medical examination without abnormal findings: Secondary | ICD-10-CM | POA: Diagnosis not present

## 2022-03-02 NOTE — Telephone Encounter (Signed)
New setting placed for cpap this morning for patient per Dr Angus Palms request.   Called and spoke to patient this morning and confirmed with her that her settings for her cpap are getting adjusted. Patient verbalized understanding. Nothing further needed

## 2022-03-02 NOTE — Patient Instructions (Signed)
[name] , Thank you for taking time to come for your Medicare Wellness Visit. I appreciate your ongoing commitment to your health goals. Please review the following plan we discussed and let me know if I can assist you in the future.   Screening recommendations/referrals: Colonoscopy: no longer required  Mammogram: no longer required  Bone Density: 07/08/2021 Recommended yearly ophthalmology/optometry visit for glaucoma screening and checkup Recommended yearly dental visit for hygiene and checkup  Vaccinations: Influenza vaccine: completed  Pneumococcal vaccine: completed  Tdap vaccine: due  Shingles vaccine: will consider     Advanced directives: none   Conditions/risks identified: none   Next appointment: none    Preventive Care 77 Years and Older, Female Preventive care refers to lifestyle choices and visits with your health care provider that can promote health and wellness. What does preventive care include? A yearly physical exam. This is also called an annual well check. Dental exams once or twice a year. Routine eye exams. Ask your health care provider how often you should have your eyes checked. Personal lifestyle choices, including: Daily care of your teeth and gums. Regular physical activity. Eating a healthy diet. Avoiding tobacco and drug use. Limiting alcohol use. Practicing safe sex. Taking low-dose aspirin every day. Taking vitamin and mineral supplements as recommended by your health care provider. What happens during an annual well check? The services and screenings done by your health care provider during your annual well check will depend on your age, overall health, lifestyle risk factors, and family history of disease. Counseling  Your health care provider may ask you questions about your: Alcohol use. Tobacco use. Drug use. Emotional well-being. Home and relationship well-being. Sexual activity. Eating habits. History of falls. Memory and  ability to understand (cognition). Work and work Statistician. Reproductive health. Screening  You may have the following tests or measurements: Height, weight, and BMI. Blood pressure. Lipid and cholesterol levels. These may be checked every 5 years, or more frequently if you are over 40 years old. Skin check. Lung cancer screening. You may have this screening every year starting at age 77 if you have a 30-pack-year history of smoking and currently smoke or have quit within the past 15 years. Fecal occult blood test (FOBT) of the stool. You may have this test every year starting at age 2. Flexible sigmoidoscopy or colonoscopy. You may have a sigmoidoscopy every 5 years or a colonoscopy every 10 years starting at age 77. Hepatitis C blood test. Hepatitis B blood test. Sexually transmitted disease (STD) testing. Diabetes screening. This is done by checking your blood sugar (glucose) after you have not eaten for a while (fasting). You may have this done every 1-3 years. Bone density scan. This is done to screen for osteoporosis. You may have this done starting at age 45. Mammogram. This may be done every 1-2 years. Talk to your health care provider about how often you should have regular mammograms. Talk with your health care provider about your test results, treatment options, and if necessary, the need for more tests. Vaccines  Your health care provider may recommend certain vaccines, such as: Influenza vaccine. This is recommended every year. Tetanus, diphtheria, and acellular pertussis (Tdap, Td) vaccine. You may need a Td booster every 10 years. Zoster vaccine. You may need this after age 77. Pneumococcal 13-valent conjugate (PCV13) vaccine. One dose is recommended after age 77. Pneumococcal polysaccharide (PPSV23) vaccine. One dose is recommended after age 77. Talk to your health care provider about which screenings  and vaccines you need and how often you need them. This information is  not intended to replace advice given to you by your health care provider. Make sure you discuss any questions you have with your health care provider. Document Released: 10/24/2015 Document Revised: 06/16/2016 Document Reviewed: 07/29/2015 Elsevier Interactive Patient Education  2017 [place] Prevention in the Home Falls can cause injuries. They can happen to people of all ages. There are many things you can do to make your home safe and to help prevent falls. What can I do on the outside of my home? Regularly fix the edges of walkways and driveways and fix any cracks. Remove anything that might make you trip as you walk through a door, such as a raised step or threshold. Trim any bushes or trees on the path to your home. Use bright outdoor lighting. Clear any walking paths of anything that might make someone trip, such as rocks or tools. Regularly check to see if handrails are loose or broken. Make sure that both sides of any steps have handrails. Any raised decks and porches should have guardrails on the edges. Have any leaves, snow, or ice cleared regularly. Use sand or salt on walking paths during winter. Clean up any spills in your garage right away. This includes oil or grease spills. What can I do in the bathroom? Use night lights. Install grab bars by the toilet and in the tub and shower. Do not use towel bars as grab bars. Use non-skid mats or decals in the tub or shower. If you need to sit down in the shower, use a plastic, non-slip stool. Keep the floor dry. Clean up any water that spills on the floor as soon as it happens. Remove soap buildup in the tub or shower regularly. Attach bath mats securely with double-sided non-slip rug tape. Do not have throw rugs and other things on the floor that can make you trip. What can I do in the bedroom? Use night lights. Make sure that you have a light by your bed that is easy to reach. Do not use any sheets or blankets that  are too big for your bed. They should not hang down onto the floor. Have a firm chair that has side arms. You can use this for support while you get dressed. Do not have throw rugs and other things on the floor that can make you trip. What can I do in the kitchen? Clean up any spills right away. Avoid walking on wet floors. Keep items that you use a lot in easy-to-reach places. If you need to reach something above you, use a strong step stool that has a grab bar. Keep electrical cords out of the way. Do not use floor polish or wax that makes floors slippery. If you must use wax, use non-skid floor wax. Do not have throw rugs and other things on the floor that can make you trip. What can I do with my stairs? Do not leave any items on the stairs. Make sure that there are handrails on both sides of the stairs and use them. Fix handrails that are broken or loose. Make sure that handrails are as long as the stairways. Check any carpeting to make sure that it is firmly attached to the stairs. Fix any carpet that is loose or worn. Avoid having throw rugs at the top or bottom of the stairs. If you do have throw rugs, attach them to the floor with carpet tape.  Make sure that you have a light switch at the top of the stairs and the bottom of the stairs. If you do not have them, ask someone to add them for you. What else can I do to help prevent falls? Wear shoes that: Do not have high heels. Have rubber bottoms. Are comfortable and fit you well. Are closed at the toe. Do not wear sandals. If you use a stepladder: Make sure that it is fully opened. Do not climb a closed stepladder. Make sure that both sides of the stepladder are locked into place. Ask someone to hold it for you, if possible. Clearly mark and make sure that you can see: Any grab bars or handrails. First and last steps. Where the edge of each step is. Use tools that help you move around (mobility aids) if they are needed. These  include: Canes. Walkers. Scooters. Crutches. Turn on the lights when you go into a dark area. Replace any light bulbs as soon as they burn out. Set up your furniture so you have a clear path. Avoid moving your furniture around. If any of your floors are uneven, fix them. If there are any pets around you, be aware of where they are. Review your medicines with your doctor. Some medicines can make you feel dizzy. This can increase your chance of falling. Ask your doctor what other things that you can do to help prevent falls. This information is not intended to replace advice given to you by your health care provider. Make sure you discuss any questions you have with your health care provider. Document Released: 07/24/2009 Document Revised: 03/04/2016 Document Reviewed: 11/01/2014 Elsevier Interactive Patient Education  2017 [employer].

## 2022-03-02 NOTE — Progress Notes (Signed)
Subjective:   [name] is a 76 y.o. female who presents for Medicare Annual (Subsequent) preventive examination.   I connected with Lamar Laundry today by telephone and verified that I am speaking with the correct person using two identifiers. Location patient: home Location provider: work Persons participating in the virtual visit: patient, provider.   I discussed the limitations, risks, security and privacy concerns of performing an evaluation and management service by telephone and the availability of in person appointments. I also discussed with the patient that there may be a patient responsible charge related to this service. The patient expressed understanding and verbally consented to this telephonic visit.    Interactive audio and video telecommunications were attempted between this provider and patient, however failed, due to patient having technical difficulties OR patient did not have access to video capability.  We continued and completed visit with audio only.    Review of Systems     Cardiac Risk Factors include: advanced age (>48mn, >>40women)     Objective:    Today's Vitals   There is no height or weight on file to calculate BMI.     03/02/2022    9:02 AM 01/27/2021   10:24 AM 12/09/2020   12:58 PM 11/16/2019    9:38 AM 11/30/2016   11:15 AM 02/04/2016   12:00 AM 09/20/2013    8:00 AM  Advanced Directives  Does Patient Have a Medical Advance Directive? No No No No Yes No Patient does not have advance directive;Patient would like information  Does patient want to make changes to medical advance directive?     Yes (MAU/Ambulatory/Procedural Areas - Information given)    Would patient like information on creating a medical advance directive? No - Patient declined No - Patient declined Yes (MAU/Ambulatory/Procedural Areas - Information given) No - Patient declined  No - patient declined information   Pre-existing out of facility DNR order (yellow form or  pink MOST form)       No    Current Medications (verified) Outpatient Encounter Medications as of 03/02/2022  Medication Sig   acetaminophen (TYLENOL) 500 MG tablet Take 500-1,000 mg by mouth as needed for mild pain or moderate pain.   albuterol (PROAIR HFA) 108 (90 Base) MCG/ACT inhaler Inhale 2 puffs into the lungs every 6 (six) hours as needed for wheezing or shortness of breath.   Cholecalciferol (VITAMIN D) 2000 UNITS CAPS Take 2,000 Units by mouth daily.   diclofenac Sodium (VOLTAREN) 1 % GEL Apply topically 4 (four) times daily.   fexofenadine (ALLEGRA) 60 MG tablet Take 60 mg by mouth daily.   fluticasone (FLONASE) 50 MCG/ACT nasal spray Place 2 sprays into both nostrils daily.   levothyroxine (SYNTHROID) 50 MCG tablet Take 1 tablet (50 mcg total) by mouth daily.   lisinopril (ZESTRIL) 20 MG tablet TAKE 1 TABLET BY MOUTH AT BEDTIME   Multiple Vitamins-Minerals (MULTIVITAMIN WITH MINERALS) tablet Take 1 tablet by mouth daily.   OXYQUINOLONE SULFATE VAGINAL (TRIMO-SAN) 0.025 % GEL Use one applicator full vaginally 2 x a week at hs   triamcinolone ointment (KENALOG) 0.1 % Apply once daily to hands as needed for hand eczema only.   Lactobacillus-Inulin (CULTURELLE DIGESTIVE DAILY PO) Take by mouth. (Patient not taking: Reported on 03/02/2022)   nystatin (MYCOSTATIN/NYSTOP) powder Apply topically 2 (two) times daily as needed. Beneath both breasts (Patient not taking: Reported on 03/02/2022)   No facility-administered encounter medications on file as of 03/02/2022.    Allergies (verified)  Codeine, Pork-derived products, and Prednisone   History: Past Medical History:  Diagnosis Date   Allergy    Arthritis    Asthma    mild   Blood transfusion without reported diagnosis    H/O bronchitis    Hemorrhoid    Hx of blood clots 1967   from birth control in LUE   Hypertension    Hypoglycemia    Hypothyroidism    Migraine    from food   Osteopenia 07/04/2019   Pneumonia    hx of    Seasonal allergies    Sleep apnea    CPAP   UTI (urinary tract infection)    Past Surgical History:  Procedure Laterality Date   CATARACT EXTRACTION     rt eye   COLONOSCOPY  05/13/2008   Ardis Hughs   detatched retina  2003   rt eye   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   Procedure: RIGHT TOTAL HIP ARTHROPLASTY;  Surgeon: Tobi Bastos, MD;  Location: [facility];  Service: Orthopedics;  Laterality: Right;   Family History  Problem Relation Age of Onset   Thyroid disease Mother    Osteoarthritis Mother    Thyroid disease Father    Osteoarthritis Father    Osteoarthritis Maternal Grandmother    Colon cancer Maternal Grandmother 74   Osteoarthritis Maternal Grandfather    Osteoarthritis Paternal Grandmother    Esophageal cancer Neg Hx    Rectal cancer Neg Hx    Stomach cancer Neg Hx    Colon polyps Neg Hx    Social History   Socioeconomic History   Marital status: Legally Separated    Spouse name: Not on file   Number of children: Not on file   Years of education: Not on file   Highest education level: Not on file  Occupational History   Not on file  Tobacco Use   Smoking status: Former    Packs/day: 3.00    Years: 13.00    Pack years: 39.00    Types: Cigarettes    Quit date: 10/11/1974    Years since quitting: 47.4   Smokeless tobacco: Never  Vaping Use   Vaping Use: Never used  Substance and Sexual Activity   Alcohol use: No    Alcohol/week: 0.0 standard drinks   Drug use: No   Sexual activity: Not Currently  Other Topics Concern   Not on file  Social History Narrative   Not on file   Social Determinants of Health   Financial Resource Strain: Low Risk    Difficulty of Paying Living Expenses: Not hard at all  Food Insecurity: No Food Insecurity   Worried About Charity fundraiser in the Last Year: Never true   Ran Out of Food in the Last Year: Never true  Transportation Needs: No Transportation Needs   Lack of Transportation (Medical): No   Lack of  Transportation (Non-Medical): No  Physical Activity: Insufficiently Active   Days of Exercise per Week: 2 days   Minutes of Exercise per Session: 20 min  Stress: No Stress Concern Present   Feeling of Stress : Not at all  Social Connections: Moderately Isolated   Frequency of Communication with Friends and Family: Twice a week   Frequency of Social Gatherings with Friends and Family: Twice a week   Attends Religious Services: 1 to 4 times per year   Active Member of [employer] or Organizations: No   Attends Archivist Meetings: Never   Marital Status: Separated  Tobacco Counseling Counseling given: Not Answered   Clinical Intake:  Pre-visit preparation completed: Yes  Pain : No/denies pain     Nutritional Risks: None Diabetes: No  How often do you need to have someone help you when you read instructions, pamphlets, or other written materials from your doctor or pharmacy?: 1 - Never What is the last grade level you completed in school?: High School  Diabetic?no   Interpreter Needed?: No  Information entered by :: L>Vlasta Baskin,LPn   Activities of Daily Living    03/02/2022    9:04 AM  In your present state of health, do you have any difficulty performing the following activities:  Hearing? 0  Vision? 0  Difficulty concentrating or making decisions? 0  Walking or climbing stairs? 0  Dressing or bathing? 0  Doing errands, shopping? 0  Preparing Food and eating ? N  Using the Toilet? N  In the past six months, have you accidently leaked urine? N  Do you have problems with loss of bowel control? N  Managing your Medications? N  Managing your Finances? N  Housekeeping or managing your Housekeeping? N    Patient Care Team: Libby Maw, MD as PCP - General (Family Medicine) Latanya Maudlin, MD as Consulting Physician (Orthopedic Surgery) Rigoberto Noel, MD as Consulting Physician (Pulmonary Disease)  Indicate any recent Medical Services you may have  received from other than [facility] providers in the past year (date may be approximate).     Assessment:   This is a routine wellness examination for Caya Soberanis.  Hearing/Vision screen Vision Screening - Comments:: Annual eye exams   Dietary issues and exercise activities discussed: Current Exercise Habits: Home exercise routine, Type of exercise: walking, Time (Minutes): 20, Frequency (Times/Week): 2, Weekly Exercise (Minutes/Week): 40, Intensity: Mild, Exercise limited by: orthopedic condition(s)   Goals Addressed             This Visit's Progress    Increase physical activity   On track      Depression Screen    03/02/2022    9:04 AM 03/02/2022    8:58 AM 09/21/2021   10:39 AM 03/11/2021   10:59 AM 12/09/2020    2:05 PM 12/09/2020    1:01 PM 11/16/2019    9:53 AM  PHQ 2/9 Scores  PHQ - 2 Score 0 0 0 0 0 0 0    Fall Risk    03/02/2022    9:03 AM 09/21/2021   10:39 AM 03/11/2021   10:59 AM 12/09/2020    2:06 PM 12/09/2020    1:00 PM  [place] in the past year? 0 0 0 1 1  Number falls in past yr: 0 0  1 0  Injury with Fall? 0   0 0  Risk for fall due to :     History of fall(s)  Follow up Falls evaluation completed    Falls prevention discussed    FALL RISK PREVENTION PERTAINING TO THE HOME:  Any stairs in or around the home? Yes  If so, are there any without handrails? No  Home free of loose throw rugs in walkways, pet beds, electrical cords, etc? Yes  Adequate lighting in your home to reduce risk of falls? Yes   ASSISTIVE DEVICES UTILIZED TO PREVENT FALLS:  Life alert? No  Use of a cane, walker or w/c? Yes  Grab bars in the bathroom? Yes  Shower chair or bench in shower? No  Elevated toilet seat or  a handicapped toilet? No    Cognitive Function:Normal cognitive status assessed by telephone conversation  by this Nurse Health Advisor. No abnormalities found.      11/30/2016   11:15 AM  MMSE - Mini Mental State Exam  Orientation to time 5  Orientation to  Place 5  Registration 3  Attention/ Calculation 5  Recall 3  Language- name 2 objects 2  Language- repeat 1  Language- follow 3 step command 3  Language- read & follow direction 1  Write a sentence 1  Copy design 1  Total score 30        Immunizations Immunization History  Administered Date(s) Administered   Fluad Quad(high Dose 65+) 06/12/2019, 07/22/2020, 07/01/2021   Influenza Split 07/20/2011, 07/05/2012   Influenza Whole 08/31/2007, 07/18/2008, 07/17/2009, 07/09/2010   Influenza, High Dose Seasonal PF 08/04/2013, 07/04/2015, 07/21/2016, 07/22/2017, 08/07/2018   Influenza,inj,Quad PF,6+ Mos 08/06/2014   PFIZER(Purple Top)SARS-COV-2 Vaccination 12/04/2019, 12/25/2019, 08/09/2020   Pneumococcal Conjugate-13 11/14/2014   Pneumococcal Polysaccharide-23 09/27/2007, 11/19/2015   Td 03/27/2008   Zoster, Live 11/02/2011    TDAP status: Due, Education has been provided regarding the importance of this vaccine. Advised may receive this vaccine at local pharmacy or Health Dept. Aware to provide a copy of the vaccination record if obtained from local pharmacy or Health Dept. Verbalized acceptance and understanding.  Flu Vaccine status: Up to date  Pneumococcal vaccine status: Up to date  Covid-19 vaccine status: Completed vaccines  Qualifies for Shingles Vaccine? Yes   Zostavax completed No   Shingrix Completed?: No.    Education has been provided regarding the importance of this vaccine. Patient has been advised to call insurance company to determine out of pocket expense if they have not yet received this vaccine. Advised may also receive vaccine at local pharmacy or Health Dept. Verbalized acceptance and understanding.  Screening Tests Health Maintenance  Topic Date Due   Zoster Vaccines- Shingrix (1 of 2) Never done   TETANUS/TDAP  03/27/2018   INFLUENZA VACCINE  05/11/2022   COLONOSCOPY (Pts 45-75yr Insurance coverage will need to be confirmed)  03/03/2024   Pneumonia  Vaccine 77 Years old  Completed   DEXA SCAN  Completed   Hepatitis C Screening  Completed   HPV VACCINES  Aged Out   COVID-19 Vaccine  Discontinued    Health Maintenance  Health Maintenance Due  Topic Date Due   Zoster Vaccines- Shingrix (1 of 2) Never done   TETANUS/TDAP  03/27/2018    Colorectal cancer screening: No longer required.   Mammogram status: No longer required due to age.  Bone Density status: Completed 07/08/2021. Results reflect: Bone density results: OSTEOPENIA. Repeat every 5 years.  Lung Cancer Screening: (Low Dose CT Chest recommended if Age 438-80years, 30 pack-year currently smoking OR have quit w/in 15years.) does not qualify.   Lung Cancer Screening Referral: n/a  Additional Screening:  Hepatitis C Screening: does not qualify;   Vision Screening: Recommended annual ophthalmology exams for early detection of glaucoma and other disorders of the eye. Is the patient up to date with their annual eye exam?  Yes  Who is the provider or what is the name of the office in which the patient attends annual eye exams? Dr.Groat  If pt is not established with a provider, would they like to be referred to a provider to establish care? No .   Dental Screening: Recommended annual dental exams for proper oral hygiene  Community Resource Referral / Chronic Care Management: CRR required  this visit?  No   CCM required this visit?  No      Plan:     I have personally reviewed and noted the following in the patient's chart:   Medical and social history Use of alcohol, tobacco or illicit drugs  Current medications and supplements including opioid prescriptions.  Functional ability and status Nutritional status Physical activity Advanced directives List of other physicians Hospitalizations, surgeries, and ER visits in previous 12 months Vitals Screenings to include cognitive, depression, and falls Referrals and appointments  In addition, I have reviewed and  discussed with patient certain preventive protocols, quality metrics, and best practice recommendations. A written personalized care plan for preventive services as well as general preventive health recommendations were provided to patient.     Randel Pigg, LPN   3/34/3568   Nurse Notes: none

## 2022-03-04 DIAGNOSIS — M1712 Unilateral primary osteoarthritis, left knee: Secondary | ICD-10-CM | POA: Diagnosis not present

## 2022-03-11 ENCOUNTER — Other Ambulatory Visit: Payer: Self-pay | Admitting: Family Medicine

## 2022-03-11 DIAGNOSIS — I10 Essential (primary) hypertension: Secondary | ICD-10-CM

## 2022-03-11 DIAGNOSIS — M1712 Unilateral primary osteoarthritis, left knee: Secondary | ICD-10-CM | POA: Diagnosis not present

## 2022-03-16 ENCOUNTER — Other Ambulatory Visit: Payer: Self-pay | Admitting: Family Medicine

## 2022-03-16 DIAGNOSIS — E038 Other specified hypothyroidism: Secondary | ICD-10-CM

## 2022-03-22 ENCOUNTER — Encounter: Payer: Self-pay | Admitting: Podiatry

## 2022-03-22 ENCOUNTER — Ambulatory Visit: Payer: Medicare HMO | Admitting: Podiatry

## 2022-03-22 DIAGNOSIS — M21612 Bunion of left foot: Secondary | ICD-10-CM

## 2022-03-22 DIAGNOSIS — M21611 Bunion of right foot: Secondary | ICD-10-CM | POA: Diagnosis not present

## 2022-03-22 DIAGNOSIS — M79674 Pain in right toe(s): Secondary | ICD-10-CM | POA: Diagnosis not present

## 2022-03-22 DIAGNOSIS — B351 Tinea unguium: Secondary | ICD-10-CM | POA: Diagnosis not present

## 2022-03-22 DIAGNOSIS — M79675 Pain in left toe(s): Secondary | ICD-10-CM

## 2022-03-22 NOTE — Progress Notes (Signed)
This patient returns to the office for evaluation and treatment of long thick painful nails .  This patient is unable to trim her own nails since the patient cannot reach her feet.  Patient says the nails are painful walking and wearing his shoes.  She returns for preventive foot care services.  General Appearance  Alert, conversant and in no acute stress.  Vascular  Dorsalis pedis and posterior tibial  pulses are palpable  bilaterally.  Capillary return is within normal limits  bilaterally. Temperature is within normal limits  bilaterally.  Neurologic  Senn-Weinstein monofilament wire test within normal limits  bilaterally. Muscle power within normal limits bilaterally.  Nails Thick disfigured discolored nails with subungual debris  from hallux to fifth toes bilaterally. No evidence of bacterial infection or drainage bilaterally.  Orthopedic  No limitations of motion  feet .  No crepitus or effusions noted.  No bony pathology or digital deformities noted.  HAV  B/L.  Skin  normotropic skin with no porokeratosis noted bilaterally.  No signs of infections or ulcers noted.   Asymptomatic callus sub 5th  metabase right foot.  Onychomycosis  Pain in toes right foot  Pain in toes left foot  Debridement  of nails  1-5  B/L with a nail nipper.  Nails were then filed using a dremel tool with no incidents.    RTC 3 months    Renarda Mullinix DPM  

## 2022-03-23 ENCOUNTER — Ambulatory Visit (INDEPENDENT_AMBULATORY_CARE_PROVIDER_SITE_OTHER): Payer: Medicare HMO | Admitting: Family Medicine

## 2022-03-23 ENCOUNTER — Encounter: Payer: Self-pay | Admitting: Family Medicine

## 2022-03-23 VITALS — BP 118/66 | HR 53 | Temp 97.3°F | Ht 64.0 in | Wt 186.2 lb

## 2022-03-23 DIAGNOSIS — M8580 Other specified disorders of bone density and structure, unspecified site: Secondary | ICD-10-CM | POA: Diagnosis not present

## 2022-03-23 DIAGNOSIS — Z131 Encounter for screening for diabetes mellitus: Secondary | ICD-10-CM | POA: Diagnosis not present

## 2022-03-23 DIAGNOSIS — Z23 Encounter for immunization: Secondary | ICD-10-CM

## 2022-03-23 DIAGNOSIS — E039 Hypothyroidism, unspecified: Secondary | ICD-10-CM | POA: Diagnosis not present

## 2022-03-23 DIAGNOSIS — I10 Essential (primary) hypertension: Secondary | ICD-10-CM

## 2022-03-23 NOTE — Progress Notes (Signed)
Established Patient Office Visit  Subjective   Patient ID: [name], female    DOB: 1946/07/22  Age: 76 y.o. MRN: [id number]  Chief Complaint  Patient presents with   Follow-up    6 month follow up, concerned about weight.     HPI follow-up of hypothyroidism, osteopenia, and left knee pain.  Left knee needs replacement.  She is receiving gel shots for this.  Voltaren gel helps when she remembers to take it.  Exercise is difficult for her.  Continues with vitamin D supplementation.  Calcium supplementation is difficult.  Calcium tablets tend to constipate her.  She is lactose intolerant and cannot drink milk but can eat cheese.  Continues to take her levothyroxine on a fasting stomach before eating each morning.    Review of Systems  Constitutional:  Negative for chills, diaphoresis, malaise/fatigue and weight loss.  HENT: Negative.    Eyes: Negative.  Negative for blurred vision and double vision.  Cardiovascular:  Negative for chest pain.  Gastrointestinal:  Negative for abdominal pain.  Genitourinary: Negative.   Musculoskeletal:  Positive for joint pain. Negative for falls and myalgias.  Neurological:  Negative for speech change, loss of consciousness and weakness.  Psychiatric/Behavioral: Negative.        Objective:     BP 118/66 (BP Location: Right Arm, Patient Position: Sitting, Cuff Size: Normal)   Pulse (!) 53   Temp (!) 97.3 F (36.3 C) (Temporal)   Ht '5\' 4"'$  (1.626 m)   Wt 186 lb 3.2 oz (84.5 kg)   SpO2 96%   BMI 31.96 kg/m  Wt Readings from Last 3 Encounters:  03/23/22 186 lb 3.2 oz (84.5 kg)  12/29/21 182 lb 9.6 oz (82.8 kg)  09/21/21 179 lb (81.2 kg)      Physical Exam Constitutional:      General: She is not in acute distress.    Appearance: Normal appearance. She is not ill-appearing, toxic-appearing or diaphoretic.  HENT:     Head: Normocephalic and atraumatic.     Right Ear: External ear normal.     Left Ear: External ear normal.      Mouth/Throat:     Mouth: Mucous membranes are moist.     Pharynx: Oropharynx is clear. No oropharyngeal exudate or posterior oropharyngeal erythema.  Eyes:     General: No scleral icterus.       Right eye: No discharge.        Left eye: No discharge.     Extraocular Movements: Extraocular movements intact.     Conjunctiva/sclera: Conjunctivae normal.     Pupils: Pupils are equal, round, and reactive to light.  Cardiovascular:     Rate and Rhythm: Normal rate and regular rhythm.  Pulmonary:     Effort: Pulmonary effort is normal. No respiratory distress.     Breath sounds: Normal breath sounds.  Musculoskeletal:     Cervical back: No rigidity or tenderness.  Skin:    General: Skin is warm and dry.  Neurological:     Mental Status: She is alert and oriented to person, place, and time.  Psychiatric:        Mood and Affect: Mood normal.        Behavior: Behavior normal.      No results found for any visits on 03/23/22.    The 10-year ASCVD risk score (Arnett DK, et al., 2019) is: 22.6%    Assessment & Plan:   Problem List Items Addressed This Visit  Cardiovascular and Mediastinum   Essential hypertension - Primary   Relevant Orders   Basic metabolic panel     Endocrine   Hypothyroidism   Relevant Orders   TSH     Musculoskeletal and Integument   Osteopenia     Other   Screening for diabetes mellitus (DM)   Relevant Orders   Hemoglobin A1c   Need for diphtheria-tetanus-pertussis (Tdap) vaccine   Relevant Orders   Tdap vaccine greater than or equal to 7yo IM    Return in about 6 months (around 09/22/2022), or if symptoms worsen or fail to improve.  Continue vitamin D supplementation.  Calcium supplement [place] as tolerated with consumption of dairy products.  We will adjust levothyroxine as needed TSH results. Use Voltaren gel for left knee pain.  Exercise as tolerated. Libby Maw, MD

## 2022-03-24 LAB — BASIC METABOLIC PANEL
BUN: 14 mg/dL (ref 6–23)
CO2: 26 mEq/L (ref 19–32)
Calcium: 9.7 mg/dL (ref 8.4–10.5)
Chloride: 105 mEq/L (ref 96–112)
Creatinine, Ser: 0.79 mg/dL (ref 0.40–1.20)
GFR: 72.35 mL/min (ref >60.00)
Glucose, Bld: 70 mg/dL (ref 70–99)
Potassium: 4.2 mEq/L (ref 3.5–5.1)
Sodium: 140 mEq/L (ref 135–145)

## 2022-03-24 LAB — HEMOGLOBIN A1C: Hgb A1c MFr Bld: 5.8 % (ref 4.6–6.5)

## 2022-03-24 LAB — TSH: TSH: 3.27 u[IU]/mL (ref 0.35–5.50)

## 2022-04-23 DIAGNOSIS — M1712 Unilateral primary osteoarthritis, left knee: Secondary | ICD-10-CM | POA: Diagnosis not present

## 2022-06-07 NOTE — Telephone Encounter (Signed)
Na

## 2022-06-09 ENCOUNTER — Ambulatory Visit: Payer: Medicare HMO | Admitting: Pulmonary Disease

## 2022-06-10 ENCOUNTER — Encounter: Payer: Self-pay | Admitting: Pulmonary Disease

## 2022-06-10 ENCOUNTER — Ambulatory Visit: Payer: Medicare HMO | Admitting: Pulmonary Disease

## 2022-06-10 ENCOUNTER — Other Ambulatory Visit: Payer: Self-pay | Admitting: Pulmonary Disease

## 2022-06-10 DIAGNOSIS — Z9989 Dependence on other enabling machines and devices: Secondary | ICD-10-CM | POA: Diagnosis not present

## 2022-06-10 DIAGNOSIS — G4733 Obstructive sleep apnea (adult) (pediatric): Secondary | ICD-10-CM | POA: Diagnosis not present

## 2022-06-10 NOTE — Assessment & Plan Note (Signed)
CPAP download continues to show residual AHI of 13/hour mostly centrals on auto settings 10 to 15 cm. Average pressure is 12.5 cm with maximum pressure of 13.  She is very compliant without a single missed night more than 7 hours every night. There is minimal leak. We have been unable to control treatment emergent central apneas , she very well may need an ASV machine We will schedule formal titration -where we can trial BiPAP if needed then also ASV to control central apneas

## 2022-06-10 NOTE — Progress Notes (Signed)
   Subjective:    Patient ID: [name], female    DOB: May 31, 1946, 76 y.o.   MRN: [id number]  HPI  76 yo Remote smoker, for FU of obstructive sleep apnea  -Treatment emergent central apneas   PMH significant for HTN, allergic rhinitis, asthmatic bronchitis  Chief Complaint  Patient presents with   Follow-up    Doing well with CPAP and no new co's today.    2-year follow-up visit today  She had residual events 8/hour on CPAP of 11 cm. On CPAP of 12 cm now, she still had residuals 8.5/hour but centrals increased to 4.9/hour  She continues to be very compliant with her CPAP machine.  Nasal mask leaves a mark over the bridge of her nose.  She denies dryness and is tolerating pressure well. Med review shows lisinopril, no cough. She had a sister passed away suddenly in her sleep  Significant tests/ events reviewed PSG 9/ 2012>>TST 286 minutes with decreased slow wave sleep and very little REM noted. Sleep onset latency was normal at 6.5 minutes and REM onset was very prolonged at 345 minutes. Sleep efficiency was poor at 70%. 50 apneas and 27 obstructive hypopneas, AHI of 16 events per hour. The events occurred in all body positions and there was moderate snoring noted throughout - low desatn of 88%   Review of Systems neg for any significant sore throat, dysphagia, itching, sneezing, nasal congestion or excess/ purulent secretions, fever, chills, sweats, unintended wt loss, pleuritic or exertional cp, hempoptysis, orthopnea pnd or change in chronic leg swelling. Also denies presyncope, palpitations, heartburn, abdominal pain, nausea, vomiting, diarrhea or change in bowel or urinary habits, dysuria,hematuria, rash, arthralgias, visual complaints, headache, numbness weakness or ataxia.     Objective:   Physical Exam  Gen. Pleasant, obese, in no distress ENT - no lesions, no post nasal drip Neck: No JVD, no thyromegaly, no carotid bruits Lungs: no use of accessory muscles, no  dullness to percussion, decreased without rales or rhonchi  Cardiovascular: Rhythm regular, heart sounds  normal, no murmurs or gallops, no peripheral edema Musculoskeletal: No deformities, no cyanosis or clubbing , no tremors        Assessment & Plan:

## 2022-06-10 NOTE — Patient Instructions (Addendum)
X trial of airfit F 30 full face mask  X schedule CPAP titration study (OK to use bilevel or ASV if needed for central apneas )

## 2022-06-21 ENCOUNTER — Telehealth: Payer: Self-pay | Admitting: Pulmonary Disease

## 2022-06-21 NOTE — Telephone Encounter (Signed)
Order that was placed on 8/31: Type Date User Summary Attachment  Provider Comments 06/10/2022 12:06 PM Rosana Berger, CMA Provider Comments -  Note:   Order to [employer] for FFM Airfit 30    Length of need:lifetime     The Rx does have what type of mask patient is needing. Sent a community message to [employer] for help with this as order does specify mask pt is needing. Will await response.

## 2022-06-21 NOTE — Telephone Encounter (Signed)
Patient called [employer] back and states they got the order 9/5 but the order did not specify the type/ name of mask he wants patient to have. Call patient back at [phone].

## 2022-06-22 ENCOUNTER — Ambulatory Visit: Payer: Medicare HMO | Admitting: Podiatry

## 2022-06-22 ENCOUNTER — Encounter: Payer: Self-pay | Admitting: Podiatry

## 2022-06-22 DIAGNOSIS — M21612 Bunion of left foot: Secondary | ICD-10-CM

## 2022-06-22 DIAGNOSIS — B351 Tinea unguium: Secondary | ICD-10-CM

## 2022-06-22 DIAGNOSIS — M79675 Pain in left toe(s): Secondary | ICD-10-CM

## 2022-06-22 DIAGNOSIS — M79674 Pain in right toe(s): Secondary | ICD-10-CM

## 2022-06-22 DIAGNOSIS — M21611 Bunion of right foot: Secondary | ICD-10-CM

## 2022-06-22 NOTE — Progress Notes (Signed)
This patient returns to the office for evaluation and treatment of long thick painful nails .  This patient is unable to trim her own nails since the patient cannot reach her feet.  Patient says the nails are painful walking and wearing his shoes.  She returns for preventive foot care services.  General Appearance  Alert, conversant and in no acute stress.  Vascular  Dorsalis pedis and posterior tibial  pulses are palpable  bilaterally.  Capillary return is within normal limits  bilaterally. Temperature is within normal limits  bilaterally.  Neurologic  Senn-Weinstein monofilament wire test within normal limits  bilaterally. Muscle power within normal limits bilaterally.  Nails Thick disfigured discolored nails with subungual debris  from hallux to fifth toes bilaterally. No evidence of bacterial infection or drainage bilaterally.  Orthopedic  No limitations of motion  feet .  No crepitus or effusions noted.  No bony pathology or digital deformities noted.  HAV  B/L.  Skin  normotropic skin with no porokeratosis noted bilaterally.  No signs of infections or ulcers noted.   Asymptomatic callus sub 5th  metabase right foot.  Onychomycosis  Pain in toes right foot  Pain in toes left foot  Debridement  of nails  1-5  B/L with a nail nipper.  Nails were then filed using a dremel tool with no incidents.    RTC 3 months    Iyla Balzarini DPM  

## 2022-06-23 DIAGNOSIS — G4733 Obstructive sleep apnea (adult) (pediatric): Secondary | ICD-10-CM | POA: Diagnosis not present

## 2022-06-23 NOTE — Telephone Encounter (Signed)
Message sent back by [employer]: [name], [name]; Gerald Stabs,   Order is in, however patient needs to return call to discuss sizing. I will forward your message to the supply team manager.   Thank you,   Leroy [place]          Due to nothing else needing to be done on our part, closing encounter.

## 2022-07-13 DIAGNOSIS — Z1231 Encounter for screening mammogram for malignant neoplasm of breast: Secondary | ICD-10-CM | POA: Diagnosis not present

## 2022-07-13 LAB — HM MAMMOGRAPHY

## 2022-07-28 ENCOUNTER — Ambulatory Visit (INDEPENDENT_AMBULATORY_CARE_PROVIDER_SITE_OTHER): Payer: Medicare HMO

## 2022-07-28 DIAGNOSIS — Z23 Encounter for immunization: Secondary | ICD-10-CM

## 2022-07-28 NOTE — Progress Notes (Signed)
..  After obtaining consent, and per orders of Dr. Ethelene Hal, injection of Flu Vaccine given by Augustina Mood. Patient tolerated injection well instructed to remain in clinic for 20 minutes afterwards, and to report any adverse reaction to me immediately.

## 2022-07-29 ENCOUNTER — Encounter: Payer: Self-pay | Admitting: Family Medicine

## 2022-08-01 ENCOUNTER — Ambulatory Visit (HOSPITAL_BASED_OUTPATIENT_CLINIC_OR_DEPARTMENT_OTHER): Payer: Medicare HMO | Attending: Pulmonary Disease | Admitting: Pulmonary Disease

## 2022-08-01 DIAGNOSIS — G4733 Obstructive sleep apnea (adult) (pediatric): Secondary | ICD-10-CM | POA: Insufficient documentation

## 2022-08-12 DIAGNOSIS — G4733 Obstructive sleep apnea (adult) (pediatric): Secondary | ICD-10-CM

## 2022-08-12 NOTE — Procedures (Signed)
Patient Name: [name], [name] Date: 08/01/2022 Gender: Female D.O.B: 16-Nov-1945 Age (years): 44 Referring Provider: Kara Mead MD, ABSM Height (inches): 64 Interpreting Physician: Kara Mead MD, ABSM Weight (lbs): 180.40 RPSGT: Jacklynn Bue BMI: 31 MRN: [id number] Neck Size: 13.00 <br> <br> CLINICAL INFORMATION The patient is referred for a BiPAP titration to treat sleep apnea. CPAP download continues to show residual AHI of 13/hour mostly centrals on auto settings 10 to 15 cm. PSG 9/ 2012>>TST 286 minutes with decreased slow wave sleep and very little REM noted. Sleep onset latency was normal at 6.5 minutes and REM onset was very prolonged at 345 minutes. Sleep efficiency was poor at 70%. 50 apneas and 27 obstructive hypopneas, AHI of 16 events per hour. The events occurred in all body positions and there was moderate snoring noted throughout - low desatn of 88%  he patient has been using Auto CPAP 10-15 cm regularly however downloads are showing rising residual AHI with some central sleep apnea  SLEEP STUDY TECHNIQUE As per the AASM Manual for the Scoring of Sleep and Associated Events v2.3 (April 2016) with a hypopnea requiring 4% desaturations.  The channels recorded and monitored were frontal, central and occipital EEG, electrooculogram (EOG), submentalis EMG (chin), nasal and oral airflow, thoracic and abdominal wall motion, anterior tibialis EMG, snore microphone, electrocardiogram, and pulse oximetry. Bilevel positive airway pressure (BPAP) was initiated at the beginning of the study and titrated to treat sleep-disordered breathing.  MEDICATIONS Medications self-administered by patient taken the night of the study : N/A  RESPIRATORY PARAMETERS Optimal IPAP Pressure (cm): 14 AHI at Optimal Pressure (/hr) 0.8 Optimal EPAP Pressure (cm):    Overall Minimal O2 (%): 85.0 Minimal O2 at Optimal Pressure (%): 94.0 SLEEP ARCHITECTURE Start Time: 10:07:05 PM Stop  Time: 4:52:02 AM Total Time (min): 405 Total Sleep Time (min): 257.5 Sleep Latency (min): 25.1 Sleep Efficiency (%): 63.6% REM Latency (min): 115.5 WASO (min): 122.3 Stage N1 (%): 12.8% Stage N2 (%): 49.1% Stage N3 (%): 28.3% Stage R (%): 9.7 Supine (%): 100.00 Arousal Index (/hr): 19.6     CARDIAC DATA The 2 lead EKG demonstrated sinus rhythm. The mean heart rate was 45.8 beats per minute. Other EKG findings include: PVCs.   LEG MOVEMENT DATA The total Periodic Limb Movements of Sleep (PLMS) were 0. The PLMS index was 0.0. A PLMS index of <15 is considered normal in adults.  IMPRESSIONS - An optimal PAP pressure was selected for this patient ( 14 / cm of water). BiPAP was tried but central apneas increased. She seemed to tolerate CPAP the best - Central sleep apnea was not noted during this titration (CAI = 4.2/h). - Moderete oxygen desaturations were observed during this titration (min O2 = 85.0%). - No snoring was audible during this study. - 2-lead EKG demonstrated: PVCs - Clinically significant periodic limb movements were not noted during this study. Arousals associated with PLMs were rare.  DIAGNOSIS - Obstructive Sleep Apnea (G47.33) - Treatment emergent central apneas   RECOMMENDATIONS - CPAP therapy on 14 cm H2O with a Small size Resmed Full Face AirFit F30 mask and heated humidification. - Avoid alcohol, sedatives and other CNS depressants that may worsen sleep apnea and disrupt normal sleep architecture. - Sleep hygiene should be reviewed to assess factors that may improve sleep quality. - Weight management and regular exercise should be initiated or continued. - Return to Sleep Center for re-evaluation after 4 weeks of therapy    Kara Mead MD Board Certified in Sleep  medicine

## 2022-08-17 ENCOUNTER — Other Ambulatory Visit: Payer: Self-pay

## 2022-08-17 DIAGNOSIS — G4733 Obstructive sleep apnea (adult) (pediatric): Secondary | ICD-10-CM

## 2022-08-23 ENCOUNTER — Encounter: Payer: Self-pay | Admitting: Family Medicine

## 2022-09-21 ENCOUNTER — Ambulatory Visit: Payer: Medicare HMO | Admitting: Podiatry

## 2022-09-21 ENCOUNTER — Encounter: Payer: Self-pay | Admitting: Podiatry

## 2022-09-21 DIAGNOSIS — B351 Tinea unguium: Secondary | ICD-10-CM

## 2022-09-21 DIAGNOSIS — M21611 Bunion of right foot: Secondary | ICD-10-CM

## 2022-09-21 DIAGNOSIS — M79675 Pain in left toe(s): Secondary | ICD-10-CM

## 2022-09-21 DIAGNOSIS — M21612 Bunion of left foot: Secondary | ICD-10-CM

## 2022-09-21 DIAGNOSIS — M79674 Pain in right toe(s): Secondary | ICD-10-CM

## 2022-09-21 NOTE — Progress Notes (Signed)
This patient returns to the office for evaluation and treatment of long thick painful nails .  This patient is unable to trim her own nails since the patient cannot reach her feet.  Patient says the nails are painful walking and wearing his shoes.  She returns for preventive foot care services.  General Appearance  Alert, conversant and in no acute stress.  Vascular  Dorsalis pedis and posterior tibial  pulses are palpable  bilaterally.  Capillary return is within normal limits  bilaterally. Temperature is within normal limits  bilaterally.  Neurologic  Senn-Weinstein monofilament wire test within normal limits  bilaterally. Muscle power within normal limits bilaterally.  Nails Thick disfigured discolored nails with subungual debris  from hallux to fifth toes bilaterally. No evidence of bacterial infection or drainage bilaterally.  Orthopedic  No limitations of motion  feet .  No crepitus or effusions noted.  No bony pathology or digital deformities noted.  HAV  B/L.  Skin  normotropic skin with no porokeratosis noted bilaterally.  No signs of infections or ulcers noted.   Asymptomatic callus sub 5th  metabase right foot.  Onychomycosis  Pain in toes right foot  Pain in toes left foot  Debridement  of nails  1-5  B/L with a nail nipper.  Nails were then filed using a dremel tool with no incidents.    RTC 3 months    Gardiner Barefoot DPM

## 2022-09-22 ENCOUNTER — Encounter: Payer: Self-pay | Admitting: Family Medicine

## 2022-09-22 ENCOUNTER — Ambulatory Visit (INDEPENDENT_AMBULATORY_CARE_PROVIDER_SITE_OTHER): Payer: Medicare HMO | Admitting: Family Medicine

## 2022-09-22 VITALS — BP 136/70 | HR 55 | Temp 97.6°F | Ht 64.0 in | Wt 181.2 lb

## 2022-09-22 DIAGNOSIS — E039 Hypothyroidism, unspecified: Secondary | ICD-10-CM

## 2022-09-22 DIAGNOSIS — Z23 Encounter for immunization: Secondary | ICD-10-CM | POA: Diagnosis not present

## 2022-09-22 DIAGNOSIS — I10 Essential (primary) hypertension: Secondary | ICD-10-CM

## 2022-09-22 LAB — BASIC METABOLIC PANEL
BUN: 10 mg/dL (ref 6–23)
CO2: 33 mEq/L — ABNORMAL HIGH (ref 19–32)
Calcium: 9.8 mg/dL (ref 8.4–10.5)
Chloride: 103 mEq/L (ref 96–112)
Creatinine, Ser: 0.74 mg/dL (ref 0.40–1.20)
GFR: 77.98 mL/min (ref >60.00)
Glucose, Bld: 87 mg/dL (ref 70–99)
Potassium: 4.2 mEq/L (ref 3.5–5.1)
Sodium: 142 mEq/L (ref 135–145)

## 2022-09-22 LAB — TSH: TSH: 1.79 u[IU]/mL (ref 0.35–5.50)

## 2022-09-22 NOTE — Progress Notes (Signed)
Established Patient Office Visit   Subjective:  Patient ID: [name], female    DOB: 01-21-1946  Age: 76 y.o. MRN: [id number]  Chief Complaint  Patient presents with   Follow-up    6 month follow up, discuss prevagen, would like HA1C checked. Patient fasting.     HPI Encounter Diagnoses  Name Primary?   Need for diphtheria-tetanus-pertussis (Tdap) vaccine Yes   Hypothyroidism, unspecified type    Essential hypertension    Follow-up of hypertension hypothyroidism Tdap vaccination.  Did not receive Tdap last visit.  TSH had increased at last check.  She is compliant with her levothyroxine at 50 mcg.  She takes it each morning on a fasting stomach.  Blood pressure is controlled with daily.  Continues to ambulate with a cane.   Review of Systems  Constitutional: Negative.   HENT: Negative.    Eyes:  Negative for blurred vision, discharge and redness.  Respiratory: Negative.    Cardiovascular: Negative.   Gastrointestinal:  Negative for abdominal pain.  Genitourinary: Negative.   Musculoskeletal: Negative.  Negative for myalgias.  Skin:  Negative for rash.  Neurological:  Negative for tingling, loss of consciousness and weakness.  Endo/Heme/Allergies:  Negative for polydipsia.     Current Outpatient Medications:    acetaminophen (TYLENOL) 500 MG tablet, Take 500-1,000 mg by mouth as needed for mild pain or moderate pain., Disp: , Rfl:    albuterol (PROAIR HFA) 108 (90 Base) MCG/ACT inhaler, Inhale 2 puffs into the lungs every 6 (six) hours as needed for wheezing or shortness of breath., Disp: 1 each, Rfl: 0   Cholecalciferol (VITAMIN D) 2000 UNITS CAPS, Take 2,000 Units by mouth daily., Disp: , Rfl:    diclofenac Sodium (VOLTAREN) 1 % GEL, Apply topically 4 (four) times daily., Disp: , Rfl:    fexofenadine (ALLEGRA) 60 MG tablet, Take 60 mg by mouth daily., Disp: , Rfl:    levothyroxine (SYNTHROID) 50 MCG tablet, Take 1 tablet by mouth once daily, Disp: 90 tablet, Rfl:  2   lisinopril (ZESTRIL) 20 MG tablet, TAKE 1 TABLET BY MOUTH AT BEDTIME, Disp: 90 tablet, Rfl: 2   Multiple Vitamins-Minerals (MULTIVITAMIN WITH MINERALS) tablet, Take 1 tablet by mouth daily., Disp: , Rfl:    nystatin (MYCOSTATIN/NYSTOP) powder, Apply topically 2 (two) times daily as needed. Beneath both breasts, Disp: 15 g, Rfl: 0   OXYQUINOLONE SULFATE VAGINAL (TRIMO-SAN) 0.025 % GEL, Use one applicator full vaginally 2 x a week at hs, Disp: 113.4 g, Rfl: 1   triamcinolone ointment (KENALOG) 0.1 %, Apply once daily to hands as needed for hand eczema only., Disp: 30 g, Rfl: 2   fluticasone (FLONASE) 50 MCG/ACT nasal spray, Place 2 sprays into both nostrils daily. (Patient not taking: Reported on 09/22/2022), Disp: 16 g, Rfl: 6   Lactobacillus-Inulin (CULTURELLE DIGESTIVE DAILY PO), Take by mouth. (Patient not taking: Reported on 09/22/2022), Disp: , Rfl:    Objective:     BP 136/70 (BP Location: Right Arm, Patient Position: Sitting, Cuff Size: Normal)   Pulse (!) 55   Temp 97.6 F (36.4 C) (Temporal)   Ht '5\' 4"'$  (1.626 m)   Wt 181 lb 3.2 oz (82.2 kg)   SpO2 99%   BMI 31.10 kg/m    Physical Exam Constitutional:      General: She is not in acute distress.    Appearance: Normal appearance. She is not ill-appearing, toxic-appearing or diaphoretic.  HENT:     Head: Normocephalic and atraumatic.  Right Ear: External ear normal.     Left Ear: External ear normal.  Eyes:     General: No scleral icterus.       Right eye: No discharge.        Left eye: No discharge.     Extraocular Movements: Extraocular movements intact.     Conjunctiva/sclera: Conjunctivae normal.  Cardiovascular:     Rate and Rhythm: Normal rate and regular rhythm.  Pulmonary:     Effort: Pulmonary effort is normal. No respiratory distress.     Breath sounds: Normal breath sounds.  Skin:    General: Skin is warm and dry.  Neurological:     Mental Status: She is alert and oriented to person, place, and time.   Psychiatric:        Mood and Affect: Mood normal.        Behavior: Behavior normal.      No results found for any visits on 09/22/22.    The 10-year ASCVD risk score (Arnett DK, et al., 2019) is: 29%    Assessment & Plan:   Need for diphtheria-tetanus-pertussis (Tdap) vaccine -     Tdap vaccine greater than or equal to 7yo IM  Hypothyroidism, unspecified type -     TSH  Essential hypertension -     Basic metabolic panel    Return in about 6 months (around 03/24/2023), or if symptoms worsen or fail to improve.  Continue lisinopril 20 daily for hypertension.  It is well-controlled.  Will adjust levothyroxine pending results of today's TSH.  Continue exercise as tolerated by walking.  Libby Maw, MD

## 2022-09-23 ENCOUNTER — Telehealth: Payer: Self-pay | Admitting: Family Medicine

## 2022-09-23 NOTE — Telephone Encounter (Signed)
Returned patients call no anwer LM on identified VM informing patient that labs were within normal limits and to continue on thyroid medication.

## 2022-09-23 NOTE — Telephone Encounter (Signed)
Pt is wanting a cb concerning her most recent lab results, please advise pt at [phone]

## 2022-11-03 ENCOUNTER — Encounter: Payer: Self-pay | Admitting: Physician Assistant

## 2022-11-03 ENCOUNTER — Ambulatory Visit: Payer: Medicare HMO | Admitting: Physician Assistant

## 2022-11-03 VITALS — BP 146/78 | HR 64 | Ht 64.0 in | Wt 187.0 lb

## 2022-11-03 DIAGNOSIS — K589 Irritable bowel syndrome without diarrhea: Secondary | ICD-10-CM

## 2022-11-03 DIAGNOSIS — E739 Lactose intolerance, unspecified: Secondary | ICD-10-CM | POA: Diagnosis not present

## 2022-11-03 DIAGNOSIS — R195 Other fecal abnormalities: Secondary | ICD-10-CM | POA: Diagnosis not present

## 2022-11-03 DIAGNOSIS — R140 Abdominal distension (gaseous): Secondary | ICD-10-CM | POA: Diagnosis not present

## 2022-11-03 MED ORDER — HYOSCYAMINE SULFATE 0.125 MG SL SUBL: 0.1250 mg | SUBLINGUAL_TABLET | Freq: Two times a day (BID) | SUBLINGUAL | 6 refills | Status: AC | PRN

## 2022-11-03 NOTE — Progress Notes (Signed)
Subjective:    Patient ID: [name], female    DOB: May 21, 1946, 77 y.o.   MRN: [id number]  HPI Kimmy Totten is a pleasant 77 year old white female, established with Dr. Ardis Hughs.  She was last seen in the office a little over a year ago by myself at that time with intermittent urgency for bowel movements and bright red blood with bowel movements.  This was felt secondary to bleeding from a friable internal hemorrhoid. She comes in today with come concerns about changes in her bowel habits.  She says she will go for several days with normal bowel movements and then have an episode which may last for 2 to 3 days in a row with "blowouts" with urgent frequent soft mushy stools which are not diarrheal.  She will have these at least 2-3 times per day during these episodes.  She has not had any recent bleeding.  She is not having a lot of bloating on a regular basis but says she is very gassy particularly in the morning at which time it is malodorous.  No ongoing abdominal pain, no nausea or vomiting, appetite is fine. She has been able to identify dairy as a trigger and knows that she is lactose intolerant.  She feels that she can do okay with cheese and yogurt but is unable to have milk, ice cream or cottage cheese.  She also feels that salads at times will aggravate her symptoms especially raw green leafy vegetables.  She has changed her diet somewhat, avoiding most fried foods. Last colonoscopy was done in May 2022 for follow-up of adenomatous colon polyps, she had 2 sessile polyps removed, 2 to 12 mm in size and these were tubular adenomas without any high-grade dysplasia and was noted to have moderate diverticulosis of the left colon and internal hemorrhoids.  Indicated for 3-year interval follow-up. Other medical problems include hypertension, sleep apnea with CPAP use, hypothyroidism, osteoarthritis and anxiety/depression.  Review of Systems Pertinent positive and negative review of systems were  noted in the above HPI section.  All other review of systems was otherwise negative.   Outpatient Encounter Medications as of 11/03/2022  Medication Sig   acetaminophen (TYLENOL) 500 MG tablet Take 500-1,000 mg by mouth as needed for mild pain or moderate pain.   albuterol (PROAIR HFA) 108 (90 Base) MCG/ACT inhaler Inhale 2 puffs into the lungs every 6 (six) hours as needed for wheezing or shortness of breath.   Cholecalciferol (VITAMIN D) 2000 UNITS CAPS Take 2,000 Units by mouth daily.   diclofenac Sodium (VOLTAREN) 1 % GEL Apply topically 4 (four) times daily.   fexofenadine (ALLEGRA) 60 MG tablet Take 60 mg by mouth daily.   fluticasone (FLONASE) 50 MCG/ACT nasal spray Place 2 sprays into both nostrils daily.   hyoscyamine (LEVSIN SL) 0.125 MG SL tablet Place 1 tablet (0.125 mg total) under the tongue 2 (two) times daily as needed (for urgency and loose stools).   levothyroxine (SYNTHROID) 50 MCG tablet Take 1 tablet by mouth once daily   lisinopril (ZESTRIL) 20 MG tablet TAKE 1 TABLET BY MOUTH AT BEDTIME   Multiple Vitamins-Minerals (MULTIVITAMIN WITH MINERALS) tablet Take 1 tablet by mouth daily.   nystatin (MYCOSTATIN/NYSTOP) powder Apply topically 2 (two) times daily as needed. Beneath both breasts   OXYQUINOLONE SULFATE VAGINAL (TRIMO-SAN) 0.025 % GEL Use one applicator full vaginally 2 x a week at hs   triamcinolone ointment (KENALOG) 0.1 % Apply once daily to hands as needed for hand eczema  only.   [DISCONTINUED] Lactobacillus-Inulin (CULTURELLE DIGESTIVE DAILY PO) Take by mouth. (Patient not taking: Reported on 09/22/2022)   No facility-administered encounter medications on file as of 11/03/2022.   Allergies  Allergen Reactions   Codeine Hives and Nausea And Vomiting   Pork-Derived Products Other (See Comments)    Migraines w/ pork and other processed meats.   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   Pain due to onychomycosis of toenails of both feet 04/22/2020   History of hypoglycemia 12/25/2019   Onychomycosis 12/25/2019   Abnormal serum cholesterol 12/25/2019   Osteopenia 07/04/2019   Obesity (BMI 30-39.9) 12/22/2017   Vitamin D deficiency 05/18/2016   Constipation 09/25/2013   Acute blood loss anemia 09/20/2013   Osteoarthritis of right hip 09/19/2013   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   Heel pain 02/16/2013   Knee pain 07/05/2012   Bilateral bunions 07/05/2012   Screening for malignant neoplasm of the cervix 07/05/2012   Screening for diabetes mellitus (DM) 07/05/2012   Sinusitis 02/18/2012   Anxiety and depression 11/02/2011   OSA on CPAP 11/02/2011   Asthma 08/12/2010   PANIC DISORDER 05/07/2009   Essential hypertension 12/05/2008   Hypothyroidism 03/27/2008   ARTHRITIS 03/27/2008   Allergic rhinitis 08/26/2007   Social History   Socioeconomic History   Marital status: Legally Separated    Spouse name: Not on file   Number of children: Not on file   Years of education: Not on file   Highest education level: Not on file  Occupational History   Not on file  Tobacco Use   Smoking status: Former    Packs/day: 3.00    Years: 13.00    Total pack years: 39.00    Types: Cigarettes    Quit date: 10/11/1974    Years since quitting: 48.0   Smokeless tobacco: Never  Vaping Use   Vaping Use: Never used  Substance and Sexual Activity   Alcohol use: No    Alcohol/week: 0.0 standard drinks of alcohol   Drug use: No   Sexual activity: Not Currently  Other Topics Concern   Not on file  Social History Narrative   Not on file   Social Determinants of Health    Financial Resource Strain: Low Risk  (03/02/2022)   Overall Financial Resource Strain (CARDIA)    Difficulty of Paying Living Expenses: Not hard at all  Food Insecurity: No Food Insecurity (03/02/2022)   Hunger Vital Sign    Worried About Running Out of Food in the Last Year: Never true    Ran Out of Food in the Last Year: Never true  Transportation Needs: No Transportation Needs (03/02/2022)   PRAPARE - Hydrologist (Medical): No    Lack of Transportation (Non-Medical): No  Physical Activity: Insufficiently Active (03/02/2022)   Exercise Vital Sign    Days of Exercise per Week: 2 days    Minutes of Exercise per Session: 20 min  Stress: No Stress Concern Present (03/02/2022)   [place]    Feeling of Stress : Not at all  Social Connections: Moderately Isolated (03/02/2022)   Social Connection and Isolation Panel [NHANES]    Frequency of Communication with Friends and Family: Twice a week    Frequency of Social Gatherings with Friends and Family: Twice a week    Attends Religious Services: 1 to 4 times per year    Active Member of [employer] or Organizations: No    Attends Archivist Meetings: Never    Marital Status: Separated  Intimate Partner Violence: Not At Risk (03/02/2022)   Humiliation, Afraid, Rape, and Kick questionnaire    Fear of Current or Ex-Partner: No    Emotionally Abused: No    Physically Abused: No    Sexually Abused: No    Ms. Rudge family history includes Colon cancer (age of onset: 67) in her maternal grandmother; Osteoarthritis in her father, maternal grandfather, maternal grandmother, mother, and paternal grandmother; Thyroid disease in her father and mother.      Objective:    Vitals:   11/03/22 1402  BP: (!) 146/78  Pulse: 64    Physical Exam Well-developed well-nourished older white female in no acute distress.  Height, Weight, 187 BMI 32.1  ambulates with a cane with some difficulty secondary to knee pain  HEENT; nontraumatic normocephalic, EOMI, PE R LA, sclera anicteric. Oropharynx; not examined today Neck; supple, no JVD Cardiovascular; regular rate and rhythm with S1-S2, no murmur rub or gallop Pulmonary; Clear bilaterally Abdomen; soft, nontender, nondistended, no palpable mass or hepatosplenomegaly, bowel sounds are active Rectal; not done today Skin; benign exam, no jaundice rash or appreciable lesions Extremities; no clubbing cyanosis or edema skin warm and dry Neuro/Psych; alert and oriented x4, grossly nonfocal mood and affect appropriate        Assessment & Plan:   #62 77 year old female with intermittent episodes of urgent loose stools which may last for 2 to 3 days alternating with several day periods of normal bowel movements. Also with excessive malodorous morning flatus  Symptoms are most consistent with IBS.  She has known lactose intolerance which is likely also contributing though she has decreased lactose intake considerably she has not totally discontinued. Also consider SIBO  #2 diverticulosis #3.  History of adenomatous colon polyps-up-to-date with colonoscopy last done May 2022 and indicated for 3-year interval follow-up/Dr. Ardis Hughs #4 internal hemorrhoids #5.  Hypertension #6.  Sleep apnea with CPAP use #7.  Hypothyroidism #8.  Osteoarthritis  Plan; we discussed continued lactose avoidance, discussed that she may be having some symptoms with intake of cheese and yogurt though not as dramatic as with milk etc. Start trial of Lactaid tablets 1-2 with any meal or snack containing any lactose.  Start trial of Levsin sublingual 1 p.o. twice daily as needed for episodes of urgency and loose stool She asks about prebiotic's and probiotics, she can give herself a trial of the combination fiber/probiotic product, as there is little downside but if she does not see any improvement in symptoms after a few  weeks okay to discontinue. Will do breath testing for SIBO  Further recommendations and follow-up pending results of breath testing. She will be established with Dr. Loletha Carrow in Dr. Eugenia Pancoast absence.  Quinton Voth Genia Harold PA-C 11/03/2022   Cc: Libby Maw,*

## 2022-11-03 NOTE — Patient Instructions (Addendum)
_______________________________________________________  If your blood pressure at your visit was 140/90 or greater, please contact your primary care physician to follow up on this.  _______________________________________________________  If you are age 78 or older, your body mass index should be between 23-30. Your Body mass index is 32.1 kg/m. If this is out of the aforementioned range listed, please consider follow up with your Primary Care Provider.  If you are age 64 or younger, your body mass index should be between 19-25. Your Body mass index is 32.1 kg/m. If this is out of the aformentioned range listed, please consider follow up with your Primary Care Provider.   ________________________________________________________  The [facility] providers would like to encourage you to use [facility] to communicate with providers for non-urgent requests or questions.  Due to long hold times on the telephone, sending your provider a message by [facility] may be a faster and more efficient way to get a response.  Please allow 48 business hours for a response.  Please remember that this is for non-urgent requests.  _______________________________________________________   Please purchase the following medications over the counter and take as directed: Lactaid 1-2 use with any food containing dairy  We have sent the following medications to your pharmacy for you to pick up at your convenience: Levsin 1 tablet 2 times daily as needed  Please purchase the following medications over the counter and take as directed: Fiber supplement with probiotic 1 tablet daily  Avoid trigger foods.   You have been given a testing kit to check for small intestine bacterial overgrowth (SIBO) which is completed by a company named [facility]. Make sure to return your test in the mail using the return mailing label given to you along with the kit. Your demographic and insurance information have already been sent to  the company and they should be in contact with you over the next 1-2 weeks regarding this test. [facility] will collect an upfront charge of $99.74 for commercial insurance plans and $209.74 is you are paying cash. Make sure to discuss with [facility] PRIOR to having the test to see if they have gotten information from your insurance company as to how much your testing will cost out of pocket, if any. Please keep in mind that you will be getting a call from phone number [phone] or a similar number. If you do not hear from them within this time frame, please call our office at [phone] or call [facility] directly at [phone].   It was a pleasure to see you today!  Thank you for trusting me with your gastrointestinal care!

## 2022-11-04 ENCOUNTER — Telehealth: Payer: Self-pay

## 2022-11-04 NOTE — Progress Notes (Signed)
____________________________________________________________  Attending physician addendum:  Thank you for sending this case to me. I have reviewed the entire note and agree with the plan.  Patient can do so if she wishes, but I do not recommend probiotics. SIBO testing is a good idea given the reported symptoms.  Wilfrid Lund, MD  ____________________________________________________________

## 2022-11-04 NOTE — Telephone Encounter (Signed)
Form and insurance card sent to [facility]

## 2022-11-05 ENCOUNTER — Telehealth: Payer: Self-pay | Admitting: Physician Assistant

## 2022-11-05 NOTE — Telephone Encounter (Signed)
Patient is required to do SIBO testing and she's having difficulty understanding the paperwork she was given. Please advise.

## 2022-11-05 NOTE — Telephone Encounter (Signed)
Called the patient back. No answer. No voicemail.

## 2022-11-10 ENCOUNTER — Encounter: Payer: Self-pay | Admitting: Adult Health

## 2022-11-10 ENCOUNTER — Ambulatory Visit: Payer: Medicare HMO | Admitting: Adult Health

## 2022-11-10 VITALS — BP 144/74 | HR 76 | Temp 98.2°F | Ht 64.0 in | Wt 186.2 lb

## 2022-11-10 DIAGNOSIS — G4733 Obstructive sleep apnea (adult) (pediatric): Secondary | ICD-10-CM | POA: Diagnosis not present

## 2022-11-10 NOTE — Addendum Note (Signed)
Addended byOralia Rud M on: 11/10/2022 01:08 PM   Modules accepted: Orders

## 2022-11-10 NOTE — Patient Instructions (Signed)
Continue on CPAP At bedtime   Work on healthy weight Do not drive a sleepy Follow-up in 1 year with Dr. Elsworth Soho and as needed

## 2022-11-10 NOTE — Progress Notes (Signed)
$'@Patient'n$  ID: [name], female    DOB: 11/01/1945, 77 y.o.   MRN: [id number]  Chief Complaint  Patient presents with   Follow-up    Referring provider: Libby Maw  HPI: 77 year old female former smoker followed for obstructive sleep apnea on CPAP (hx of treatment emergent central apneas)   TEST/EVENTS :  Sleep study 07/2011 >AHI of 16/hr    11/10/2022 Follow up : OSA  Patient returns for a 21-monthfollow-up.  Patient has obstructive sleep apnea has been on CPAP for years.  Last few visit she has had excellent compliance on her CPAP but increased residuals and treatment emergent central apneas.  Her CPAP pressure has been adjusted from set pressure to AutoSet without any significant improvement.  Patient was set up for CPAP titration study that was completed on August 01, 2022 that showed optimal control on CPAP 14 cm H2O.  Patient says since she started on this pressure she has felt much better.  Her daytime sleepiness and sleep regimen has been significantly improved.  She has noticed that her mask is been leaking a lot this past week.  Her mask is almost 4104 monthsold.  We discussed getting new supplies.  CPAP download shows excellent compliance with 100% usage.  Daily average usage at 8 hours.  Patient is on CPAP 14 cm H2O.  AHI is 4.4/hour central events were 2.6/hour.  Positive mask leak.   Allergies  Allergen Reactions   Codeine Hives and Nausea And Vomiting   Pork-Derived Products Other (See Comments)    Migraines w/ pork and other processed meats.   Hydrocodone Other (See Comments)   Prednisone Other (See Comments)    Causes more pain    Immunization History  Administered Date(s) Administered   Fluad Quad(high Dose 65+) 06/12/2019, 07/22/2020, 07/01/2021, 07/28/2022   Influenza Split 07/20/2011, 07/05/2012   Influenza Whole 08/31/2007, 07/18/2008, 07/17/2009, 07/09/2010   Influenza, High Dose Seasonal PF 08/04/2013, 07/04/2015, 07/21/2016, 07/22/2017,  08/07/2018   Influenza,inj,Quad PF,6+ Mos 08/06/2014   PFIZER(Purple Top)SARS-COV-2 Vaccination 12/04/2019, 12/25/2019, 08/09/2020   Pneumococcal Conjugate-13 11/14/2014   Pneumococcal Polysaccharide-23 09/27/2007, 11/19/2015   Td 03/27/2008   Td (Adult), 2 Lf Tetanus Toxid, Preservative Free 03/27/2008   Tdap 09/22/2022   Zoster, Live 11/02/2011    Past Medical History:  Diagnosis Date   Allergy    Arthritis    Asthma    mild   Blood transfusion without reported diagnosis    H/O bronchitis    Hemorrhoid    Hx of blood clots 1967   from birth control in LUE   Hypertension    Hypoglycemia    Hypothyroidism    Migraine    from food   Osteopenia 07/04/2019   Pneumonia    hx of   Seasonal allergies    Sleep apnea    CPAP   UTI (urinary tract infection)     Tobacco History: Social History   Tobacco Use  Smoking Status Former   Packs/day: 3.00   Years: 13.00   Total pack years: 39.00   Types: Cigarettes   Quit date: 10/11/1974   Years since quitting: 48.1  Smokeless Tobacco Never   Counseling given: Not Answered   Outpatient Medications Prior to Visit  Medication Sig Dispense Refill   acetaminophen (TYLENOL) 500 MG tablet Take 500-1,000 mg by mouth as needed for mild pain or moderate pain.     albuterol (PROAIR HFA) 108 (90 Base) MCG/ACT inhaler Inhale 2 puffs into the lungs every  6 (six) hours as needed for wheezing or shortness of breath. 1 each 0   Cholecalciferol (VITAMIN D) 2000 UNITS CAPS Take 2,000 Units by mouth daily.     diclofenac Sodium (VOLTAREN) 1 % GEL Apply topically 4 (four) times daily.     fexofenadine (ALLEGRA) 60 MG tablet Take 60 mg by mouth daily.     hyoscyamine (LEVSIN SL) 0.125 MG SL tablet Place 1 tablet (0.125 mg total) under the tongue 2 (two) times daily as needed (for urgency and loose stools). 60 tablet 6   levothyroxine (SYNTHROID) 50 MCG tablet Take 1 tablet by mouth once daily 90 tablet 2   lisinopril (ZESTRIL) 20 MG tablet TAKE 1  TABLET BY MOUTH AT BEDTIME 90 tablet 2   Multiple Vitamins-Minerals (MULTIVITAMIN WITH MINERALS) tablet Take 1 tablet by mouth daily.     nystatin (MYCOSTATIN/NYSTOP) powder Apply topically 2 (two) times daily as needed. Beneath both breasts 15 g 0   OXYQUINOLONE SULFATE VAGINAL (TRIMO-SAN) 0.025 % GEL Use one applicator full vaginally 2 x a week at hs 113.4 g 1   triamcinolone ointment (KENALOG) 0.1 % Apply once daily to hands as needed for hand eczema only. 30 g 2   fluticasone (FLONASE) 50 MCG/ACT nasal spray Place 2 sprays into both nostrils daily. (Patient not taking: Reported on 11/10/2022) 16 g 6   No facility-administered medications prior to visit.     Review of Systems:   Constitutional:   No  weight loss, night sweats,  Fevers, chills, fatigue, or  lassitude.  HEENT:   No headaches,  Difficulty swallowing,  Tooth/dental problems, or  Sore throat,                No sneezing, itching, ear ache, nasal congestion, post nasal drip,   CV:  No chest pain,  Orthopnea, PND, swelling in lower extremities, anasarca, dizziness, palpitations, syncope.   GI  No heartburn, indigestion, abdominal pain, nausea, vomiting, diarrhea, change in bowel habits, loss of appetite, bloody stools.   Resp: No shortness of breath with exertion or at rest.  No excess mucus, no productive cough,  No non-productive cough,  No coughing up of blood.  No change in color of mucus.  No wheezing.  No chest wall deformity  Skin: no rash or lesions.  GU: no dysuria, change in color of urine, no urgency or frequency.  No flank pain, no hematuria   MS:  No joint pain or swelling.  No decreased range of motion.  No back pain.    Physical Exam  BP (!) 144/74 (BP Location: Right Arm, Patient Position: Sitting, Cuff Size: Normal)   Pulse 76   Temp 98.2 F (36.8 C) (Oral)   Ht '5\' 4"'$  (1.626 m)   Wt 186 lb 3.2 oz (84.5 kg)   SpO2 99%   BMI 31.96 kg/m   GEN: A/Ox3; pleasant , NAD, well nourished    HEENT:   [place]/AT,   NOSE-clear, THROAT-clear, no lesions, no postnasal drip or exudate noted.   NECK:  Supple w/ fair ROM; no JVD; normal carotid impulses w/o bruits; no thyromegaly or nodules palpated; no lymphadenopathy.    RESP  Clear  P & A; w/o, wheezes/ rales/ or rhonchi. no accessory muscle use, no dullness to percussion  CARD:  RRR, no m/r/g, no peripheral edema, pulses intact, no cyanosis or clubbing.  GI:   Soft & nt; nml bowel sounds; no organomegaly or masses detected.   Musco: Warm bil, no deformities or joint  swelling noted.   Neuro: alert, no focal deficits noted.    Skin: Warm, no lesions or rashes    Lab Results:  CBC    Component Value Date/Time   WBC 5.0 09/21/2021 1123   RBC 4.17 09/21/2021 1123   HGB 12.8 09/21/2021 1123   HCT 39.2 09/21/2021 1123   PLT 208.0 09/21/2021 1123   MCV 93.9 09/21/2021 1123   MCH 30.6 09/23/2013 0512   MCHC 32.8 09/21/2021 1123   RDW 13.7 09/21/2021 1123   LYMPHSABS 1.0 05/15/2015 0901   MONOABS 0.3 05/15/2015 0901   EOSABS 0.1 05/15/2015 0901   BASOSABS 0.0 05/15/2015 0901    BMET    Component Value Date/Time   NA 142 09/22/2022 1112   K 4.2 09/22/2022 1112   CL 103 09/22/2022 1112   CO2 33 (H) 09/22/2022 1112   GLUCOSE 87 09/22/2022 1112   BUN 10 09/22/2022 1112   CREATININE 0.74 09/22/2022 1112   CALCIUM 9.8 09/22/2022 1112   GFRNONAA 84 (L) 09/21/2013 0455   GFRAA >90 09/21/2013 0455    BNP No results found for: "BNP"  ProBNP No results found for: "PROBNP"  Imaging: No results found.        No data to display          No results found for: "NITRICOXIDE"      Assessment & Plan:   OSA on CPAP Significant improvement on CPAP at set pressure at 14 cm H2O.  Significant drop in residuals and central events.  Continue on current settings.  Order for new supplies sent to her DME.  Plan  Patient Instructions  Continue on CPAP At bedtime   Work on healthy weight Do not drive a sleepy Follow-up in 1  year with Dr. Elsworth Soho and as needed      Rexene Edison, NP 11/10/2022

## 2022-11-10 NOTE — Assessment & Plan Note (Signed)
Significant improvement on CPAP at set pressure at 14 cm H2O.  Significant drop in residuals and central events.  Continue on current settings.  Order for new supplies sent to her DME.  Plan  Patient Instructions  Continue on CPAP At bedtime   Work on healthy weight Do not drive a sleepy Follow-up in 1 year with Dr. Elsworth Soho and as needed

## 2022-11-19 DIAGNOSIS — G4733 Obstructive sleep apnea (adult) (pediatric): Secondary | ICD-10-CM | POA: Diagnosis not present

## 2022-11-24 ENCOUNTER — Other Ambulatory Visit (HOSPITAL_COMMUNITY): Payer: Self-pay

## 2022-11-24 ENCOUNTER — Telehealth: Payer: Self-pay | Admitting: Pharmacy Technician

## 2022-11-24 NOTE — Telephone Encounter (Signed)
Patient Advocate Encounter  Received notification from [facility] that prior authorization for HYOSCYOMINE 0.125MG is required.   PA submitted on 2.14.24 Key [id number] Status is pending

## 2022-11-24 NOTE — Telephone Encounter (Signed)
Received a fax regarding Prior Authorization from [employer] for [place] 0.125MG. Authorization has been DENIED because .  Phone#[phone]

## 2022-11-25 NOTE — Telephone Encounter (Signed)
Spoke with the pharmacist. Patient used the Good Rx card and bought the medication for $20.

## 2022-12-02 ENCOUNTER — Encounter: Payer: Self-pay | Admitting: Podiatry

## 2022-12-02 NOTE — Telephone Encounter (Signed)
Patient wont be able to do the breathe test she said she doesn't have the money for it. Advised her that she wont be charged unless she sends it in. Voiced understanding

## 2022-12-02 NOTE — Telephone Encounter (Signed)
Inbound call from patient requesting t speak with a nurse about her SIBO kit. Please advise.  Thank you

## 2022-12-07 ENCOUNTER — Encounter: Payer: Self-pay | Admitting: Podiatry

## 2022-12-12 ENCOUNTER — Other Ambulatory Visit: Payer: Self-pay | Admitting: Family Medicine

## 2022-12-12 DIAGNOSIS — I10 Essential (primary) hypertension: Secondary | ICD-10-CM

## 2022-12-18 DIAGNOSIS — G4733 Obstructive sleep apnea (adult) (pediatric): Secondary | ICD-10-CM | POA: Diagnosis not present

## 2022-12-20 ENCOUNTER — Other Ambulatory Visit: Payer: Self-pay | Admitting: Family Medicine

## 2022-12-20 DIAGNOSIS — E038 Other specified hypothyroidism: Secondary | ICD-10-CM

## 2022-12-24 ENCOUNTER — Ambulatory Visit: Payer: Medicare HMO | Admitting: Podiatry

## 2023-01-03 ENCOUNTER — Encounter: Payer: Self-pay | Admitting: Podiatry

## 2023-01-03 ENCOUNTER — Ambulatory Visit: Payer: Medicare HMO | Admitting: Podiatry

## 2023-01-03 DIAGNOSIS — B351 Tinea unguium: Secondary | ICD-10-CM | POA: Diagnosis not present

## 2023-01-03 DIAGNOSIS — M79675 Pain in left toe(s): Secondary | ICD-10-CM

## 2023-01-03 DIAGNOSIS — M79674 Pain in right toe(s): Secondary | ICD-10-CM

## 2023-01-03 NOTE — Progress Notes (Signed)
This patient returns to the office for evaluation and treatment of long thick painful nails .  This patient is unable to trim her own nails since the patient cannot reach her feet.  Patient says the nails are painful walking and wearing his shoes.  She returns for preventive foot care services.  General Appearance  Alert, conversant and in no acute stress.  Vascular  Dorsalis pedis and posterior tibial  pulses are palpable  bilaterally.  Capillary return is within normal limits  bilaterally. Temperature is within normal limits  bilaterally.  Neurologic  Senn-Weinstein monofilament wire test within normal limits  bilaterally. Muscle power within normal limits bilaterally.  Nails Thick disfigured discolored nails with subungual debris  from hallux to fifth toes bilaterally. No evidence of bacterial infection or drainage bilaterally.  Orthopedic  No limitations of motion  feet .  No crepitus or effusions noted.  No bony pathology or digital deformities noted.  HAV  B/L.  Skin  normotropic skin with no porokeratosis noted bilaterally.  No signs of infections or ulcers noted.   Asymptomatic callus sub 5th  metabase right foot.  Onychomycosis  Pain in toes right foot  Pain in toes left foot  Debridement  of nails  1-5  B/L with a nail nipper.  Nails were then filed using a dremel tool with no incidents.    RTC 3 months    Gavina Dildine DPM  

## 2023-01-18 DIAGNOSIS — G4733 Obstructive sleep apnea (adult) (pediatric): Secondary | ICD-10-CM | POA: Diagnosis not present

## 2023-01-25 ENCOUNTER — Telehealth: Payer: Self-pay | Admitting: Family Medicine

## 2023-01-25 NOTE — Telephone Encounter (Signed)
Contacted [name] to schedule their annual wellness visit. Appointment made for 01/28/23.  [name] AWV direct phone # [phone]

## 2023-01-28 ENCOUNTER — Ambulatory Visit (INDEPENDENT_AMBULATORY_CARE_PROVIDER_SITE_OTHER): Payer: Medicare HMO

## 2023-01-28 VITALS — Ht 64.0 in | Wt 188.0 lb

## 2023-01-28 DIAGNOSIS — Z0000 Encounter for general adult medical examination without abnormal findings: Secondary | ICD-10-CM | POA: Diagnosis not present

## 2023-01-28 NOTE — Patient Instructions (Signed)
[name] , Thank you for taking time to come for your Medicare Wellness Visit. I appreciate your ongoing commitment to your health goals. Please review the following plan we discussed and let me know if I can assist you in the future.   These are the goals we discussed:  Goals      Increase physical activity     Patient Stated     01/28/2023, wants to lose weight        This is a list of the screening recommended for you and due dates:  Health Maintenance  Topic Date Due   Zoster (Shingles) Vaccine (1 of 2) 12/21/2024*   Flu Shot  05/12/2023   Medicare Annual Wellness Visit  01/28/2024   Colon Cancer Screening  03/03/2024   DTaP/Tdap/Td vaccine (4 - Td or Tdap) 09/22/2032   Pneumonia Vaccine  Completed   DEXA scan (bone density measurement)  Completed   Hepatitis C Screening: USPSTF Recommendation to screen - Ages 22-79 yo.  Completed   HPV Vaccine  Aged Out   COVID-19 Vaccine  Discontinued  *Topic was postponed. The date shown is not the original due date.    Advanced directives: Advance directive discussed with you today. .  Conditions/risks identified: none  Next appointment: Follow up in one year for your annual wellness visit    Preventive Care 65 Years and Older, Female Preventive care refers to lifestyle choices and visits with your health care provider that can promote health and wellness. What does preventive care include? A yearly physical exam. This is also called an annual well check. Dental exams once or twice a year. Routine eye exams. Ask your health care provider how often you should have your eyes checked. Personal lifestyle choices, including: Daily care of your teeth and gums. Regular physical activity. Eating a healthy diet. Avoiding tobacco and drug use. Limiting alcohol use. Practicing safe sex. Taking low-dose aspirin every day. Taking vitamin and mineral supplements as recommended by your health care provider. What happens during an annual  well check? The services and screenings done by your health care provider during your annual well check will depend on your age, overall health, lifestyle risk factors, and family history of disease. Counseling  Your health care provider may ask you questions about your: Alcohol use. Tobacco use. Drug use. Emotional well-being. Home and relationship well-being. Sexual activity. Eating habits. History of falls. Memory and ability to understand (cognition). Work and work Astronomer. Reproductive health. Screening  You may have the following tests or measurements: Height, weight, and BMI. Blood pressure. Lipid and cholesterol levels. These may be checked every 5 years, or more frequently if you are over 27 years old. Skin check. Lung cancer screening. You may have this screening every year starting at age 53 if you have a 30-pack-year history of smoking and currently smoke or have quit within the past 15 years. Fecal occult blood test (FOBT) of the stool. You may have this test every year starting at age 22. Flexible sigmoidoscopy or colonoscopy. You may have a sigmoidoscopy every 5 years or a colonoscopy every 10 years starting at age 55. Hepatitis C blood test. Hepatitis B blood test. Sexually transmitted disease (STD) testing. Diabetes screening. This is done by checking your blood sugar (glucose) after you have not eaten for a while (fasting). You may have this done every 1-3 years. Bone density scan. This is done to screen for osteoporosis. You may have this done starting at age 63. Mammogram. This may  be done every 1-2 years. Talk to your health care provider about how often you should have regular mammograms. Talk with your health care provider about your test results, treatment options, and if necessary, the need for more tests. Vaccines  Your health care provider may recommend certain vaccines, such as: Influenza vaccine. This is recommended every year. Tetanus, diphtheria,  and acellular pertussis (Tdap, Td) vaccine. You may need a Td booster every 10 years. Zoster vaccine. You may need this after age 82. Pneumococcal 13-valent conjugate (PCV13) vaccine. One dose is recommended after age 95. Pneumococcal polysaccharide (PPSV23) vaccine. One dose is recommended after age 73. Talk to your health care provider about which screenings and vaccines you need and how often you need them. This information is not intended to replace advice given to you by your health care provider. Make sure you discuss any questions you have with your health care provider. Document Released: 10/24/2015 Document Revised: 06/16/2016 Document Reviewed: 07/29/2015 Elsevier Interactive Patient Education  2017 [place] Prevention in the Home Falls can cause injuries. They can happen to people of all ages. There are many things you can do to make your home safe and to help prevent falls. What can I do on the outside of my home? Regularly fix the edges of walkways and driveways and fix any cracks. Remove anything that might make you trip as you walk through a door, such as a raised step or threshold. Trim any bushes or trees on the path to your home. Use bright outdoor lighting. Clear any walking paths of anything that might make someone trip, such as rocks or tools. Regularly check to see if handrails are loose or broken. Make sure that both sides of any steps have handrails. Any raised decks and porches should have guardrails on the edges. Have any leaves, snow, or ice cleared regularly. Use sand or salt on walking paths during winter. Clean up any spills in your garage right away. This includes oil or grease spills. What can I do in the bathroom? Use night lights. Install grab bars by the toilet and in the tub and shower. Do not use towel bars as grab bars. Use non-skid mats or decals in the tub or shower. If you need to sit down in the shower, use a plastic, non-slip  stool. Keep the floor dry. Clean up any water that spills on the floor as soon as it happens. Remove soap buildup in the tub or shower regularly. Attach bath mats securely with double-sided non-slip rug tape. Do not have throw rugs and other things on the floor that can make you trip. What can I do in the bedroom? Use night lights. Make sure that you have a light by your bed that is easy to reach. Do not use any sheets or blankets that are too big for your bed. They should not hang down onto the floor. Have a firm chair that has side arms. You can use this for support while you get dressed. Do not have throw rugs and other things on the floor that can make you trip. What can I do in the kitchen? Clean up any spills right away. Avoid walking on wet floors. Keep items that you use a lot in easy-to-reach places. If you need to reach something above you, use a strong step stool that has a grab bar. Keep electrical cords out of the way. Do not use floor polish or wax that makes floors slippery. If you must use  wax, use non-skid floor wax. Do not have throw rugs and other things on the floor that can make you trip. What can I do with my stairs? Do not leave any items on the stairs. Make sure that there are handrails on both sides of the stairs and use them. Fix handrails that are broken or loose. Make sure that handrails are as long as the stairways. Check any carpeting to make sure that it is firmly attached to the stairs. Fix any carpet that is loose or worn. Avoid having throw rugs at the top or bottom of the stairs. If you do have throw rugs, attach them to the floor with carpet tape. Make sure that you have a light switch at the top of the stairs and the bottom of the stairs. If you do not have them, ask someone to add them for you. What else can I do to help prevent falls? Wear shoes that: Do not have high heels. Have rubber bottoms. Are comfortable and fit you well. Are closed at the  toe. Do not wear sandals. If you use a stepladder: Make sure that it is fully opened. Do not climb a closed stepladder. Make sure that both sides of the stepladder are locked into place. Ask someone to hold it for you, if possible. Clearly mark and make sure that you can see: Any grab bars or handrails. First and last steps. Where the edge of each step is. Use tools that help you move around (mobility aids) if they are needed. These include: Canes. Walkers. Scooters. Crutches. Turn on the lights when you go into a dark area. Replace any light bulbs as soon as they burn out. Set up your furniture so you have a clear path. Avoid moving your furniture around. If any of your floors are uneven, fix them. If there are any pets around you, be aware of where they are. Review your medicines with your doctor. Some medicines can make you feel dizzy. This can increase your chance of falling. Ask your doctor what other things that you can do to help prevent falls. This information is not intended to replace advice given to you by your health care provider. Make sure you discuss any questions you have with your health care provider. Document Released: 07/24/2009 Document Revised: 03/04/2016 Document Reviewed: 11/01/2014 Elsevier Interactive Patient Education  2017 [employer].

## 2023-01-28 NOTE — Progress Notes (Signed)
I connected with  [name] on 01/28/23 by a audio enabled telemedicine application and verified that I am speaking with the correct person using two identifiers.  Patient Location: Home  Provider Location: Office/Clinic  I discussed the limitations of evaluation and management by telemedicine. The patient expressed understanding and agreed to proceed.  Subjective:   [name] is a 77 y.o. female who presents for Medicare Annual (Subsequent) preventive examination.  Review of Systems     Cardiac Risk Factors include: advanced age (>43men, >32 women);hypertension;obesity (BMI >30kg/m2)     Objective:    Today's Vitals   01/28/23 1434 01/28/23 1435  Weight: 188 lb (85.3 kg)   Height: 5\' 4"  (1.626 m)   PainSc:  3    Body mass index is 32.27 kg/m.     01/28/2023    2:43 PM 08/01/2022   10:27 PM 03/02/2022    9:02 AM 01/27/2021   10:24 AM 12/09/2020   12:58 PM 11/16/2019    9:38 AM 11/30/2016   11:15 AM  Advanced Directives  Does Patient Have a Medical Advance Directive? No No No No No No Yes  Does patient want to make changes to medical advance directive?       Yes (MAU/Ambulatory/Procedural Areas - Information given)  Would patient like information on creating a medical advance directive?  Yes (MAU/Ambulatory/Procedural Areas - Information given) No - Patient declined No - Patient declined Yes (MAU/Ambulatory/Procedural Areas - Information given) No - Patient declined     Current Medications (verified) Outpatient Encounter Medications as of 01/28/2023  Medication Sig   acetaminophen (TYLENOL) 500 MG tablet Take 500-1,000 mg by mouth as needed for mild pain or moderate pain.   albuterol (PROAIR HFA) 108 (90 Base) MCG/ACT inhaler Inhale 2 puffs into the lungs every 6 (six) hours as needed for wheezing or shortness of breath.   Cholecalciferol (VITAMIN D) 2000 UNITS CAPS Take 2,000 Units by mouth daily.   fexofenadine (ALLEGRA) 60 MG tablet Take 60 mg by mouth daily.    hyoscyamine (LEVSIN SL) 0.125 MG SL tablet Place 1 tablet (0.125 mg total) under the tongue 2 (two) times daily as needed (for urgency and loose stools).   levothyroxine (SYNTHROID) 50 MCG tablet Take 1 tablet by mouth once daily   lisinopril (ZESTRIL) 20 MG tablet TAKE 1 TABLET BY MOUTH AT BEDTIME   Menthol, Topical Analgesic, (BIOFREEZE EX) Apply topically.   Multiple Vitamins-Minerals (MULTIVITAMIN WITH MINERALS) tablet Take 1 tablet by mouth daily.   nystatin (MYCOSTATIN/NYSTOP) powder Apply topically 2 (two) times daily as needed. Beneath both breasts   OXYQUINOLONE SULFATE VAGINAL (TRIMO-SAN) 0.025 % GEL Use one applicator full vaginally 2 x a week at hs   triamcinolone ointment (KENALOG) 0.1 % Apply once daily to hands as needed for hand eczema only.   diclofenac Sodium (VOLTAREN) 1 % GEL Apply topically 4 (four) times daily. (Patient not taking: Reported on 01/28/2023)   fluticasone (FLONASE) 50 MCG/ACT nasal spray Place 2 sprays into both nostrils daily. (Patient not taking: Reported on 11/10/2022)   No facility-administered encounter medications on file as of 01/28/2023.    Allergies (verified) Codeine, Pork-derived products, Hydrocodone, and Prednisone   History: Past Medical History:  Diagnosis Date   Allergy    Arthritis    Asthma    mild   Blood transfusion without reported diagnosis    H/O bronchitis    Hemorrhoid    Hx of blood clots 1967   from birth control in LUE  Hypertension    Hypoglycemia    Hypothyroidism    Migraine    from food   Osteopenia 07/04/2019   Pneumonia    hx of   Seasonal allergies    Sleep apnea    CPAP   UTI (urinary tract infection)    Past Surgical History:  Procedure Laterality Date   CATARACT EXTRACTION     rt eye   COLONOSCOPY  05/13/2008   Christella Hartigan   detatched retina  2003   rt eye   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   Procedure: RIGHT TOTAL HIP ARTHROPLASTY;  Surgeon: Jacki Cones, MD;  Location: [facility];   Service: Orthopedics;  Laterality: Right;   Family History  Problem Relation Age of Onset   Thyroid disease Mother    Osteoarthritis Mother    Thyroid disease Father    Osteoarthritis Father    Osteoarthritis Maternal Grandmother    Colon cancer Maternal Grandmother 110   Osteoarthritis Maternal Grandfather    Osteoarthritis Paternal Grandmother    Esophageal cancer Neg Hx    Rectal cancer Neg Hx    Stomach cancer Neg Hx    Colon polyps Neg Hx    Social History   Socioeconomic History   Marital status: Legally Separated    Spouse name: Not on file   Number of children: Not on file   Years of education: Not on file   Highest education level: Not on file  Occupational History   Not on file  Tobacco Use   Smoking status: Former    Packs/day: 3.00    Years: 13.00    Additional pack years: 0.00    Total pack years: 39.00    Types: Cigarettes    Quit date: 10/11/1974    Years since quitting: 48.3   Smokeless tobacco: Never  Vaping Use   Vaping Use: Never used  Substance and Sexual Activity   Alcohol use: No    Alcohol/week: 0.0 standard drinks of alcohol   Drug use: No   Sexual activity: Not Currently  Other Topics Concern   Not on file  Social History Narrative   Not on file   Social Determinants of Health   Financial Resource Strain: Low Risk  (01/28/2023)   Overall Financial Resource Strain (CARDIA)    Difficulty of Paying Living Expenses: Not hard at all  Food Insecurity: No Food Insecurity (01/28/2023)   Hunger Vital Sign    Worried About Running Out of Food in the Last Year: Never true    Ran Out of Food in the Last Year: Never true  Transportation Needs: No Transportation Needs (01/28/2023)   PRAPARE - Administrator, Civil Service (Medical): No    Lack of Transportation (Non-Medical): No  Physical Activity: Inactive (01/28/2023)   Exercise Vital Sign    Days of Exercise per Week: 0 days    Minutes of Exercise per Session: 0 min  Stress: No  Stress Concern Present (01/28/2023)   [employer] of Occupational Health - Occupational Stress Questionnaire    Feeling of Stress : Only a little  Social Connections: Moderately Isolated (03/02/2022)   Social Connection and Isolation Panel [NHANES]    Frequency of Communication with Friends and Family: Twice a week    Frequency of Social Gatherings with Friends and Family: Twice a week    Attends Religious Services: 1 to 4 times per year    Active Member of [employer] or Organizations: No    Attends Banker  Meetings: Never    Marital Status: Separated    Tobacco Counseling Counseling given: Not Answered   Clinical Intake:  Pre-visit preparation completed: Yes  Pain : 0-10 Pain Score: 3  Pain Type: Chronic pain Pain Location: Back Pain Orientation: Right, Lower Pain Descriptors / Indicators: Aching Pain Onset: More than a month ago Pain Frequency: Constant     Nutritional Status: BMI > 30  Obese Nutritional Risks: None Diabetes: No  How often do you need to have someone help you when you read instructions, pamphlets, or other written materials from your doctor or pharmacy?: 1 - Never  Diabetic? no  Interpreter Needed?: No  Information entered by :: NAllen LPN   Activities of Daily Living    01/28/2023    2:44 PM 03/02/2022    9:04 AM  In your present state of health, do you have any difficulty performing the following activities:  Hearing? 0 0  Vision? 0 0  Difficulty concentrating or making decisions? 0 0  Walking or climbing stairs? 0 0  Dressing or bathing? 0 0  Doing errands, shopping? 0 0  Preparing Food and eating ? N N  Using the Toilet? N N  In the past six months, have you accidently leaked urine? Y N  Do you have problems with loss of bowel control? N N  Managing your Medications? N N  Managing your Finances? N N  Housekeeping or managing your Housekeeping? N N    Patient Care Team: Mliss Sax, MD as PCP - General  (Family Medicine) Ranee Gosselin, MD as Consulting Physician (Orthopedic Surgery) Oretha Milch, MD as Consulting Physician (Pulmonary Disease)  Indicate any recent Medical Services you may have received from other than [facility] providers in the past year (date may be approximate).     Assessment:   This is a routine wellness examination for [name].  Hearing/Vision screen Vision Screening - Comments:: No regular eye exams  Dietary issues and exercise activities discussed: Current Exercise Habits: The patient does not participate in regular exercise at present   Goals Addressed             This Visit's Progress    Patient Stated       01/28/2023, wants to lose weight       Depression Screen    01/28/2023    2:44 PM 09/22/2022   10:35 AM 03/02/2022    9:04 AM 03/02/2022    8:58 AM 09/21/2021   10:39 AM 03/11/2021   10:59 AM 12/09/2020    2:05 PM  PHQ 2/9 Scores  PHQ - 2 Score 0 0 0 0 0 0 0    Fall Risk    01/28/2023    2:44 PM 09/22/2022   10:35 AM 03/02/2022    9:03 AM 09/21/2021   10:39 AM 03/11/2021   10:59 AM  Fall Risk   Falls in the past year? 0 0 0 0 0  Number falls in past yr: 0 0 0 0   Injury with Fall? 0 0 0    Risk for fall due to : Medication side effect;Impaired balance/gait No Fall Risks     Follow up Falls prevention discussed;Education provided;Falls evaluation completed Falls evaluation completed Falls evaluation completed      FALL RISK PREVENTION PERTAINING TO THE HOME:  Any stairs in or around the home? Yes  If so, are there any without handrails? No  Home free of loose throw rugs in walkways, pet beds, electrical cords, etc?  Yes  Adequate lighting in your home to reduce risk of falls? Yes   ASSISTIVE DEVICES UTILIZED TO PREVENT FALLS:  Life alert? No  Use of a cane, walker or w/c? Yes  Grab bars in the bathroom? Yes  Shower chair or bench in shower? Yes  Elevated toilet seat or a handicapped toilet? No   TIMED UP AND GO:  Was the  test performed? No .      Cognitive Function:    11/30/2016   11:15 AM  MMSE - Mini Mental State Exam  Orientation to time 5  Orientation to Place 5  Registration 3  Attention/ Calculation 5  Recall 3  Language- name 2 objects 2  Language- repeat 1  Language- follow 3 step command 3  Language- read & follow direction 1  Write a sentence 1  Copy design 1  Total score 30        01/28/2023    2:47 PM  6CIT Screen  What Year? 0 points  What month? 0 points  What time? 0 points  Count back from 20 0 points  Months in reverse 0 points  Repeat phrase 4 points  Total Score 4 points    Immunizations Immunization History  Administered Date(s) Administered   Fluad Quad(high Dose 65+) 06/12/2019, 07/22/2020, 07/01/2021, 07/28/2022   Influenza Split 07/20/2011, 07/05/2012   Influenza Whole 08/31/2007, 07/18/2008, 07/17/2009, 07/09/2010   Influenza, High Dose Seasonal PF 08/04/2013, 07/04/2015, 07/21/2016, 07/22/2017, 08/07/2018   Influenza,inj,Quad PF,6+ Mos 08/06/2014   PFIZER(Purple Top)SARS-COV-2 Vaccination 12/04/2019, 12/25/2019, 08/09/2020   Pneumococcal Conjugate-13 11/14/2014   Pneumococcal Polysaccharide-23 09/27/2007, 11/19/2015   Td 03/27/2008   Td (Adult), 2 Lf Tetanus Toxid, Preservative Free 03/27/2008   Tdap 09/22/2022   Zoster, Live 11/02/2011    TDAP status: Up to date  Flu Vaccine status: Up to date  Pneumococcal vaccine status: Up to date  Covid-19 vaccine status: Completed vaccines  Qualifies for Shingles Vaccine? Yes   Zostavax completed Yes   Shingrix Completed?: No.    Education has been provided regarding the importance of this vaccine. Patient has been advised to call insurance company to determine out of pocket expense if they have not yet received this vaccine. Advised may also receive vaccine at local pharmacy or Health Dept. Verbalized acceptance and understanding.  Screening Tests Health Maintenance  Topic Date Due   Medicare Annual  Wellness (AWV)  03/03/2023   Zoster Vaccines- Shingrix (1 of 2) 12/21/2024 (Originally 03/13/1995)   INFLUENZA VACCINE  05/12/2023   COLONOSCOPY (Pts 45-66yrs Insurance coverage will need to be confirmed)  03/03/2024   DTaP/Tdap/Td (4 - Td or Tdap) 09/22/2032   Pneumonia Vaccine 29+ Years old  Completed   DEXA SCAN  Completed   Hepatitis C Screening  Completed   HPV VACCINES  Aged Out   COVID-19 Vaccine  Discontinued    Health Maintenance  Health Maintenance Due  Topic Date Due   Medicare Annual Wellness (AWV)  03/03/2023    Colorectal cancer screening: Type of screening: Colonoscopy. Completed 03/03/2021. Repeat every 3 years  Mammogram status: Completed 07/13/2022. Repeat every year  Bone Density status: Completed 07/08/2021.   Lung Cancer Screening: (Low Dose CT Chest recommended if Age 57-80 years, 30 pack-year currently smoking OR have quit w/in 15years.) does not qualify.   Lung Cancer Screening Referral: no  Additional Screening:  Hepatitis C Screening: does qualify; Completed 11/30/2016  Vision Screening: Recommended annual ophthalmology exams for early detection of glaucoma and other disorders of the eye.  Is the patient up to date with their annual eye exam?  No  Who is the provider or what is the name of the office in which the patient attends annual eye exams? none If pt is not established with a provider, would they like to be referred to a provider to establish care? No .   Dental Screening: Recommended annual dental exams for proper oral hygiene  Community Resource Referral / Chronic Care Management: CRR required this visit?  No   CCM required this visit?  No      Plan:     I have personally reviewed and noted the following in the patient's chart:   Medical and social history Use of alcohol, tobacco or illicit drugs  Current medications and supplements including opioid prescriptions. Patient is not currently taking opioid prescriptions. Functional  ability and status Nutritional status Physical activity Advanced directives List of other physicians Hospitalizations, surgeries, and ER visits in previous 12 months Vitals Screenings to include cognitive, depression, and falls Referrals and appointments  In addition, I have reviewed and discussed with patient certain preventive protocols, quality metrics, and best practice recommendations. A written personalized care plan for preventive services as well as general preventive health recommendations were provided to patient.     Barb Merino, LPN   01/17/8118   Nurse Notes: none  Due to this being a virtual visit, the after visit summary with patients personalized plan was offered to patient via mail or my-chart. to pick up at office at next visit

## 2023-02-16 ENCOUNTER — Ambulatory Visit (INDEPENDENT_AMBULATORY_CARE_PROVIDER_SITE_OTHER): Payer: Medicare HMO | Admitting: Family Medicine

## 2023-02-16 ENCOUNTER — Telehealth: Payer: Self-pay | Admitting: Family Medicine

## 2023-02-16 ENCOUNTER — Encounter: Payer: Self-pay | Admitting: Family Medicine

## 2023-02-16 VITALS — BP 136/63 | HR 64 | Temp 97.8°F | Ht 64.0 in | Wt 190.0 lb

## 2023-02-16 DIAGNOSIS — R600 Localized edema: Secondary | ICD-10-CM | POA: Diagnosis not present

## 2023-02-16 DIAGNOSIS — R21 Rash and other nonspecific skin eruption: Secondary | ICD-10-CM | POA: Diagnosis not present

## 2023-02-16 MED ORDER — CEPHALEXIN 500 MG PO CAPS
500.0000 mg | ORAL_CAPSULE | Freq: Four times a day (QID) | ORAL | 0 refills | Status: AC
Start: 2023-02-16 — End: 2023-02-21

## 2023-02-16 NOTE — Patient Instructions (Signed)
Start Keflex. Lab is already closed today so unable to add any today.  Elevate legs as often as possible Follow-up with PCP on Friday or Monday Seek emergency care if symptoms become severe.

## 2023-02-16 NOTE — Telephone Encounter (Signed)
Patient has appointment for evaluation with Hyman Hopes today.

## 2023-02-16 NOTE — Telephone Encounter (Signed)
Caller Name: [name], pt Caller Ph #: [phone] Chief Complaint:  severe rash on legs, burning/itching, leg swelling, red & hot    This call was transferred to [facility] w/Nurse Triage/Access Nurse. This is for documentation purposes. No follow up required at this time.

## 2023-02-16 NOTE — Progress Notes (Signed)
   Acute Office Visit  Subjective:     Patient ID: [name], female    DOB: [birth year], 77 y.o.   MRN: [id number]  Chief Complaint  Patient presents with   Leg Problem    HPI Patient is in today for lower leg swelling and rash.   Patient states she has noticed some mild left leg swelling off and on for the past few weeks, but yesterday it became more prominent and started with a mildly red rash. Today she noticed the right leg swelling and redness developing. States legs are achy, but no posterior/calf pain, weeping, fevers, chills, open lesions, chest pain, dyspnea.       ROS All review of systems negative except what is listed in the HPI      Objective:    BP 136/63   Pulse 64   Temp 97.8 F (36.6 C) (Oral)   Ht 5\' 4"  (1.626 m)   Wt 190 lb (86.2 kg)   SpO2 99%   BMI 32.61 kg/m    Physical Exam Vitals reviewed.  Constitutional:      General: She is not in acute distress.    Appearance: Normal appearance. She is not ill-appearing.  Pulmonary:     Effort: Pulmonary effort is normal.  Musculoskeletal:        General: Normal range of motion.     Right lower leg: Edema present.     Left lower leg: Edema present.     Comments: Negative Homans sign bilaterally   Skin:    General: Skin is warm and dry.     Capillary Refill: Capillary refill takes 2 to 3 seconds.     Findings: Erythema and rash present.  Neurological:     General: No focal deficit present.     Mental Status: She is alert and oriented to person, place, and time.     Motor: No weakness.     Gait: Gait normal.  Psychiatric:        Mood and Affect: Mood normal.        Behavior: Behavior normal.        Judgment: Judgment normal.             No results found for any visits on 02/16/23.      Assessment & Plan:   Problem List Items Addressed This Visit   None Visit Diagnoses     Bilateral lower extremity edema    -  Primary Rash Consider stasis dermatitis, cellulitis, etc,  but given the tenderness, warmth, and shiny appearance of skin, will go ahead and treat empirically with keflex. Recommended elevating legs.  Negative Homans sign.   Unable to get labs today (late appointment, lab already closed). Recommend following with PCP in 2-4 days for recheck. Patient aware of signs/symptoms requiring further/urgent evaluation.   Relevant Medications   cephALEXin (KEFLEX) 500 MG capsule       Meds ordered this encounter  Medications   cephALEXin (KEFLEX) 500 MG capsule    Sig: Take 1 capsule (500 mg total) by mouth 4 (four) times daily for 5 days.    Dispense:  20 capsule    Refill:  0    Order Specific Question:   Supervising Provider    Answer:   Danise Edge A [[id number]]    Return for edema check in 2-4 days with PCP.  Clayborne Dana, NP

## 2023-02-18 ENCOUNTER — Encounter: Payer: Self-pay | Admitting: Family Medicine

## 2023-02-18 ENCOUNTER — Other Ambulatory Visit: Payer: Self-pay | Admitting: Family Medicine

## 2023-02-18 ENCOUNTER — Ambulatory Visit (INDEPENDENT_AMBULATORY_CARE_PROVIDER_SITE_OTHER): Payer: Medicare HMO | Admitting: Family Medicine

## 2023-02-18 VITALS — BP 140/78 | HR 57 | Temp 98.4°F | Ht 64.0 in | Wt 189.2 lb

## 2023-02-18 DIAGNOSIS — R600 Localized edema: Secondary | ICD-10-CM | POA: Diagnosis not present

## 2023-02-18 LAB — CBC
MCHC: 32.8 g/dL (ref 32.0–36.0)
RBC: 3.63 10*6/uL — ABNORMAL LOW (ref 3.80–5.10)

## 2023-02-18 MED ORDER — FUROSEMIDE 20 MG PO TABS
20.0000 mg | ORAL_TABLET | Freq: Every day | ORAL | 1 refills | Status: DC
Start: 2023-02-18 — End: 2023-02-24

## 2023-02-18 NOTE — Progress Notes (Unsigned)
Established Patient Office Visit   Subjective:  Patient ID: [name], female    DOB: [birth year]  Age: 77 y.o. MRN: [id number]  Chief Complaint  Patient presents with   Edema    Follow up on swelling in legs.     HPI Encounter Diagnoses  Name Primary?   Bilateral lower extremity edema Yes   For follow-up of bilateral lower extremity edema.  Was seen a few days ago in urgent care and was placed on Keflex.  She has been taking it without issue.  Photographs were taken.  There was bilateral swelling of both legs with generalized erythema that was predominantly medial located again in both legs.  Perhaps a little bit more pronounced on the right.  She is denying any chest pain shortness of breath abdominal pain difficulty with stooling or urination.  She is seen no blood in her urine or stool.  There has been no fever or chills. {History (Optional):23778}  Review of Systems  Constitutional: Negative.   HENT: Negative.    Eyes:  Negative for blurred vision, discharge and redness.  Respiratory: Negative.  Negative for cough, hemoptysis and shortness of breath.   Cardiovascular:  Positive for leg swelling. Negative for chest pain, palpitations and orthopnea.  Gastrointestinal:  Negative for abdominal pain, blood in stool, melena, nausea and vomiting.  Genitourinary: Negative.  Negative for dysuria, frequency, hematuria and urgency.  Musculoskeletal: Negative.  Negative for joint pain and myalgias.  Skin:  Negative for rash.  Neurological:  Negative for tingling, loss of consciousness and weakness.  Endo/Heme/Allergies:  Negative for polydipsia.     Current Outpatient Medications:    acetaminophen (TYLENOL) 500 MG tablet, Take 500-1,000 mg by mouth as needed for mild pain or moderate pain., Disp: , Rfl:    albuterol (PROAIR HFA) 108 (90 Base) MCG/ACT inhaler, Inhale 2 puffs into the lungs every 6 (six) hours as needed for wheezing or shortness of breath., Disp: 1 each, Rfl: 0    cephALEXin (KEFLEX) 500 MG capsule, Take 1 capsule (500 mg total) by mouth 4 (four) times daily for 5 days., Disp: 20 capsule, Rfl: 0   Cholecalciferol (VITAMIN D) 2000 UNITS CAPS, Take 2,000 Units by mouth daily., Disp: , Rfl:    fexofenadine (ALLEGRA) 60 MG tablet, Take 60 mg by mouth daily., Disp: , Rfl:    fluticasone (FLONASE) 50 MCG/ACT nasal spray, Place 2 sprays into both nostrils daily., Disp: 16 g, Rfl: 6   furosemide (LASIX) 20 MG tablet, Take 1 tablet (20 mg total) by mouth daily. Each morning., Disp: 7 tablet, Rfl: 1   hyoscyamine (LEVSIN SL) 0.125 MG SL tablet, Place 1 tablet (0.125 mg total) under the tongue 2 (two) times daily as needed (for urgency and loose stools)., Disp: 60 tablet, Rfl: 6   levothyroxine (SYNTHROID) 50 MCG tablet, Take 1 tablet by mouth once daily, Disp: 90 tablet, Rfl: 2   lisinopril (ZESTRIL) 20 MG tablet, TAKE 1 TABLET BY MOUTH AT BEDTIME, Disp: 90 tablet, Rfl: 0   Menthol, Topical Analgesic, (BIOFREEZE EX), Apply topically., Disp: , Rfl:    Multiple Vitamins-Minerals (MULTIVITAMIN WITH MINERALS) tablet, Take 1 tablet by mouth daily., Disp: , Rfl:    nystatin (MYCOSTATIN/NYSTOP) powder, Apply topically 2 (two) times daily as needed. Beneath both breasts, Disp: 15 g, Rfl: 0   OXYQUINOLONE SULFATE VAGINAL (TRIMO-SAN) 0.025 % GEL, Use one applicator full vaginally 2 x a week at hs, Disp: 113.4 g, Rfl: 1   triamcinolone ointment (KENALOG) 0.1 %,  Apply once daily to hands as needed for hand eczema only., Disp: 30 g, Rfl: 2   Objective:     BP (!) 140/78 (BP Location: Left Arm, Patient Position: Sitting, Cuff Size: Large)   Pulse (!) 57   Temp 98.4 F (36.9 C) (Temporal)   Ht 5\' 4"  (1.626 m)   Wt 189 lb 3.2 oz (85.8 kg)   SpO2 97%   BMI 32.48 kg/m  {Vitals History (Optional):23777}  Physical Exam Constitutional:      General: She is not in acute distress.    Appearance: Normal appearance. She is not ill-appearing, toxic-appearing or diaphoretic.   HENT:     Head: Normocephalic and atraumatic.     Right Ear: External ear normal.     Left Ear: External ear normal.     Mouth/Throat:     Mouth: Mucous membranes are moist.     Pharynx: Oropharynx is clear. No oropharyngeal exudate or posterior oropharyngeal erythema.  Eyes:     General: No scleral icterus.       Right eye: No discharge.        Left eye: No discharge.     Extraocular Movements: Extraocular movements intact.     Conjunctiva/sclera: Conjunctivae normal.     Pupils: Pupils are equal, round, and reactive to light.  Cardiovascular:     Rate and Rhythm: Normal rate and regular rhythm.  Pulmonary:     Effort: Pulmonary effort is normal. No respiratory distress.     Breath sounds: Normal breath sounds. No wheezing or rales.  Abdominal:     General: Bowel sounds are normal.     Tenderness: There is no abdominal tenderness. There is no right CVA tenderness or left CVA tenderness.  Musculoskeletal:     Cervical back: No rigidity or tenderness.     Right lower leg: Edema present.     Left lower leg: Edema present.  Skin:    General: Skin is warm and dry.     Comments: Erythema seen in the pictures from 2 days ago has resolved.  Legs are still edematous with 1-2+ pitting edema.  Edema extends from the anterior shins into the dorsum of the feet.  Capillary refill is brisk.  Neurological:     Mental Status: She is alert and oriented to person, place, and time.  Psychiatric:        Mood and Affect: Mood normal.        Behavior: Behavior normal.      No results found for any visits on 02/18/23.  {Labs (Optional):23779}  The 10-year ASCVD risk score (Arnett DK, et al., 2019) is: 30.4%    Assessment & Plan:   Bilateral lower extremity edema -     Furosemide; Take 1 tablet (20 mg total) by mouth daily. Each morning.  Dispense: 7 tablet; Refill: 1 -     CBC -     Comprehensive metabolic panel -     Sedimentation rate; Future -     Brain natriuretic peptide;  Future -     Urinalysis, Routine w reflex microscopic    Return in about 6 days (around 02/24/2023).   Lasix every morning for 7 days.  Awaiting test results.  Question vasculitis. Mliss Sax, MD

## 2023-02-19 LAB — COMPREHENSIVE METABOLIC PANEL
AST: 17 U/L (ref 10–35)
Alkaline phosphatase (APISO): 47 U/L (ref 37–153)
Creat: 0.89 mg/dL (ref 0.60–1.00)
Globulin: 2.5 g/dL (calc) (ref 1.9–3.7)
Potassium: 4.4 mmol/L (ref 3.5–5.3)
Total Bilirubin: 0.4 mg/dL (ref 0.2–1.2)

## 2023-02-21 ENCOUNTER — Telehealth: Payer: Self-pay | Admitting: Family Medicine

## 2023-02-21 LAB — COMPREHENSIVE METABOLIC PANEL
AG Ratio: 1.5 (calc) (ref 1.0–2.5)
ALT: 9 U/L (ref 6–29)
Albumin: 3.7 g/dL (ref 3.6–5.1)
BUN: 10 mg/dL (ref 7–25)
CO2: 30 mmol/L (ref 20–32)
Calcium: 9.2 mg/dL (ref 8.6–10.4)
Chloride: 107 mmol/L (ref 98–110)
Glucose, Bld: 77 mg/dL (ref 65–99)
Sodium: 143 mmol/L (ref 135–146)
Total Protein: 6.2 g/dL (ref 6.1–8.1)

## 2023-02-21 LAB — CBC
HCT: 33.8 % — ABNORMAL LOW (ref 35.0–45.0)
Hemoglobin: 11.1 g/dL — ABNORMAL LOW (ref 11.7–15.5)
MCH: 30.6 pg (ref 27.0–33.0)
MCV: 93.1 fL (ref 80.0–100.0)
MPV: 10.1 fL (ref 7.5–12.5)
Platelets: 256 10*3/uL (ref 140–400)
RDW: 13 % (ref 11.0–15.0)
WBC: 5.1 10*3/uL (ref 3.8–10.8)

## 2023-02-21 LAB — URINALYSIS, ROUTINE W REFLEX MICROSCOPIC

## 2023-02-21 LAB — SEDIMENTATION RATE: Sed Rate: 36 mm/h — ABNORMAL HIGH (ref 0–30)

## 2023-02-21 NOTE — Telephone Encounter (Signed)
Pt would like a call to hear her lab results. [phone]

## 2023-02-21 NOTE — Telephone Encounter (Signed)
Returned call went over lab results.  

## 2023-02-22 LAB — BRAIN NATRIURETIC PEPTIDE: Brain Natriuretic Peptide: 90 pg/mL (ref <100)

## 2023-02-22 LAB — HOUSE ACCOUNT TRACKING

## 2023-02-24 ENCOUNTER — Encounter: Payer: Self-pay | Admitting: Family Medicine

## 2023-02-24 ENCOUNTER — Ambulatory Visit (INDEPENDENT_AMBULATORY_CARE_PROVIDER_SITE_OTHER): Payer: Medicare HMO | Admitting: Family Medicine

## 2023-02-24 VITALS — BP 136/68 | HR 52 | Temp 97.7°F | Ht 64.0 in | Wt 183.2 lb

## 2023-02-24 DIAGNOSIS — E611 Iron deficiency: Secondary | ICD-10-CM

## 2023-02-24 DIAGNOSIS — D649 Anemia, unspecified: Secondary | ICD-10-CM

## 2023-02-24 DIAGNOSIS — E039 Hypothyroidism, unspecified: Secondary | ICD-10-CM | POA: Diagnosis not present

## 2023-02-24 DIAGNOSIS — R600 Localized edema: Secondary | ICD-10-CM

## 2023-02-24 MED ORDER — FUROSEMIDE 20 MG PO TABS
20.0000 mg | ORAL_TABLET | Freq: Every day | ORAL | 0 refills | Status: DC
Start: 2023-02-24 — End: 2023-03-30

## 2023-02-24 NOTE — Progress Notes (Signed)
Established Patient Office Visit   Subjective:  Patient ID: [name], female    DOB: August 22, 1946  Age: 77 y.o. MRN: [id number]  Chief Complaint  Patient presents with   Foot Swelling    Follow up on swelling in feet and legs. No concerns.     HPI Encounter Diagnoses  Name Primary?   Hypothyroidism, unspecified type Yes   Normocytic anemia    Bilateral lower extremity edema    Lower extremity edema pends and still present.  Denies any shortness of breath or chest pain.  Lasix seems to be helping but of course it is led to frequency of urination.  History of apnea.  Using her CPAP.  She has noted that recently she gets up in the morning and wants to go back to bed in a few hours.  She is concerned about her thyroid.   Review of Systems  Constitutional:  Positive for malaise/fatigue.  HENT: Negative.    Eyes:  Negative for blurred vision, discharge and redness.  Respiratory: Negative.    Cardiovascular: Negative.   Gastrointestinal:  Negative for abdominal pain.  Genitourinary: Negative.   Musculoskeletal: Negative.  Negative for myalgias.  Skin:  Negative for rash.  Neurological:  Negative for tingling, loss of consciousness and weakness.  Endo/Heme/Allergies:  Negative for polydipsia.     Current Outpatient Medications:    acetaminophen (TYLENOL) 500 MG tablet, Take 500-1,000 mg by mouth as needed for mild pain or moderate pain., Disp: , Rfl:    albuterol (PROAIR HFA) 108 (90 Base) MCG/ACT inhaler, Inhale 2 puffs into the lungs every 6 (six) hours as needed for wheezing or shortness of breath., Disp: 1 each, Rfl: 0   Cholecalciferol (VITAMIN D) 2000 UNITS CAPS, Take 2,000 Units by mouth daily., Disp: , Rfl:    fexofenadine (ALLEGRA) 60 MG tablet, Take 60 mg by mouth daily., Disp: , Rfl:    fluticasone (FLONASE) 50 MCG/ACT nasal spray, Place 2 sprays into both nostrils daily., Disp: 16 g, Rfl: 6   furosemide (LASIX) 20 MG tablet, Take 1 tablet (20 mg total) by mouth  daily., Disp: 30 tablet, Rfl: 0   hyoscyamine (LEVSIN SL) 0.125 MG SL tablet, Place 1 tablet (0.125 mg total) under the tongue 2 (two) times daily as needed (for urgency and loose stools)., Disp: 60 tablet, Rfl: 6   levothyroxine (SYNTHROID) 50 MCG tablet, Take 1 tablet by mouth once daily, Disp: 90 tablet, Rfl: 2   lisinopril (ZESTRIL) 20 MG tablet, TAKE 1 TABLET BY MOUTH AT BEDTIME, Disp: 90 tablet, Rfl: 0   Menthol, Topical Analgesic, (BIOFREEZE EX), Apply topically., Disp: , Rfl:    Multiple Vitamins-Minerals (MULTIVITAMIN WITH MINERALS) tablet, Take 1 tablet by mouth daily., Disp: , Rfl:    nystatin (MYCOSTATIN/NYSTOP) powder, Apply topically 2 (two) times daily as needed. Beneath both breasts, Disp: 15 g, Rfl: 0   OXYQUINOLONE SULFATE VAGINAL (TRIMO-SAN) 0.025 % GEL, Use one applicator full vaginally 2 x a week at hs, Disp: 113.4 g, Rfl: 1   triamcinolone ointment (KENALOG) 0.1 %, Apply once daily to hands as needed for hand eczema only., Disp: 30 g, Rfl: 2   Objective:     BP 136/68 (BP Location: Right Arm, Patient Position: Sitting, Cuff Size: Normal)   Pulse (!) 52   Temp 97.7 F (36.5 C) (Temporal)   Ht 5\' 4"  (1.626 m)   Wt 183 lb 3.2 oz (83.1 kg)   SpO2 99%   BMI 31.45 kg/m  Wt Readings  from Last 3 Encounters:  02/24/23 183 lb 3.2 oz (83.1 kg)  02/18/23 189 lb 3.2 oz (85.8 kg)  02/16/23 190 lb (86.2 kg)      Physical Exam Constitutional:      General: She is not in acute distress.    Appearance: Normal appearance. She is not ill-appearing, toxic-appearing or diaphoretic.  HENT:     Head: Normocephalic and atraumatic.     Right Ear: External ear normal.     Left Ear: External ear normal.  Eyes:     General: No scleral icterus.       Right eye: No discharge.        Left eye: No discharge.     Extraocular Movements: Extraocular movements intact.     Conjunctiva/sclera: Conjunctivae normal.  Cardiovascular:     Rate and Rhythm: Normal rate and regular rhythm.   Pulmonary:     Effort: Pulmonary effort is normal. No respiratory distress.     Breath sounds: Normal breath sounds. No wheezing, rhonchi or rales.  Musculoskeletal:     Right lower leg: Edema present.     Left lower leg: Edema present.     Comments: Decreased edema bilateral lower extremities.  Skin:    General: Skin is warm and dry.  Neurological:     Mental Status: She is alert and oriented to person, place, and time.  Psychiatric:        Mood and Affect: Mood normal.        Behavior: Behavior normal.      No results found for any visits on 02/24/23.    The 10-year ASCVD risk score (Arnett DK, et al., 2019) is: 29%    Assessment & Plan:   Hypothyroidism, unspecified type -     TSH  Normocytic anemia -     B12 and Folate Panel -     Iron, TIBC and Ferritin Panel  Bilateral lower extremity edema -     Brain natriuretic peptide -     Furosemide; Take 1 tablet (20 mg total) by mouth daily.  Dispense: 30 tablet; Refill: 0    Return in about 2 weeks (around 03/10/2023).    Mliss Sax, MD

## 2023-02-25 LAB — B12 AND FOLATE PANEL
Folate: >23.9 ng/mL (ref >5.9)
Vitamin B-12: 427 pg/mL (ref 211–911)

## 2023-02-25 LAB — IRON,TIBC AND FERRITIN PANEL
%SAT: 20 % (calc) (ref 16–45)
Ferritin: 32 ng/mL (ref 16–288)
Iron: 67 ug/dL (ref 45–160)
TIBC: 327 mcg/dL (calc) (ref 250–450)

## 2023-02-25 LAB — TSH: TSH: 2.54 u[IU]/mL (ref 0.35–5.50)

## 2023-02-25 NOTE — Addendum Note (Signed)
Addended by: Trudee Kuster on: 02/25/2023 09:56 AM   Modules accepted: Orders

## 2023-02-28 ENCOUNTER — Other Ambulatory Visit (INDEPENDENT_AMBULATORY_CARE_PROVIDER_SITE_OTHER): Payer: Medicare HMO

## 2023-02-28 DIAGNOSIS — R600 Localized edema: Secondary | ICD-10-CM

## 2023-02-28 DIAGNOSIS — E611 Iron deficiency: Secondary | ICD-10-CM | POA: Insufficient documentation

## 2023-02-28 LAB — SEDIMENTATION RATE: Sed Rate: 28 mm/h (ref 0–30)

## 2023-02-28 MED ORDER — IRON (FERROUS SULFATE) 325 (65 FE) MG PO TABS
325.0000 mg | ORAL_TABLET | ORAL | 2 refills | Status: DC
Start: 2023-02-28 — End: 2023-12-27

## 2023-02-28 NOTE — Addendum Note (Signed)
Addended by: Nadene Rubins A on: 02/28/2023 12:30 PM   Modules accepted: Orders

## 2023-03-01 LAB — BRAIN NATRIURETIC PEPTIDE: Pro B Natriuretic peptide (BNP): 111 pg/mL — ABNORMAL HIGH (ref 0.0–100.0)

## 2023-03-10 ENCOUNTER — Ambulatory Visit (INDEPENDENT_AMBULATORY_CARE_PROVIDER_SITE_OTHER): Payer: Medicare HMO | Admitting: Family Medicine

## 2023-03-10 ENCOUNTER — Encounter: Payer: Self-pay | Admitting: Family Medicine

## 2023-03-10 VITALS — BP 128/70 | HR 51 | Temp 98.1°F | Ht 64.0 in | Wt 183.2 lb

## 2023-03-10 DIAGNOSIS — R600 Localized edema: Secondary | ICD-10-CM

## 2023-03-10 DIAGNOSIS — I10 Essential (primary) hypertension: Secondary | ICD-10-CM

## 2023-03-10 MED ORDER — LISINOPRIL 20 MG PO TABS
20.0000 mg | ORAL_TABLET | Freq: Every day | ORAL | 0 refills | Status: DC
Start: 2023-03-10 — End: 2023-06-24

## 2023-03-10 NOTE — Progress Notes (Signed)
Established Patient Office Visit   Subjective:  Patient ID: [name], female    DOB: 08/24/1946  Age: 77 y.o. MRN: [id number]  Chief Complaint  Patient presents with   Medical Management of Chronic Issues    2 week follow up on swelling in feet and legs. Patient states that the room spinning sometime when she first get up and when she lays back down for bed.     HPI Encounter Diagnoses  Name Primary?   Benign essential HTN Yes   Bilateral lower extremity edema    Swelling and edema have decreased after starting furosemide.  She has experienced some lightheadedness with standing that quickly passes.  Seems to have leveled out.  Blood pressure well-controlled with lisinopril.  Recent BNP, CMP were both okay.  No chest pain or shortness of breath.  CBC yielded a macrocytic anemia.   Review of Systems  Constitutional: Negative.   HENT: Negative.    Eyes:  Negative for blurred vision, discharge and redness.  Respiratory: Negative.  Negative for shortness of breath.   Cardiovascular: Negative.  Negative for chest pain.  Gastrointestinal:  Negative for abdominal pain.  Genitourinary: Negative.   Musculoskeletal: Negative.  Negative for myalgias.  Skin:  Negative for rash.  Neurological:  Negative for tingling, loss of consciousness and weakness.  Endo/Heme/Allergies:  Negative for polydipsia.     Current Outpatient Medications:    acetaminophen (TYLENOL) 500 MG tablet, Take 500-1,000 mg by mouth as needed for mild pain or moderate pain., Disp: , Rfl:    albuterol (PROAIR HFA) 108 (90 Base) MCG/ACT inhaler, Inhale 2 puffs into the lungs every 6 (six) hours as needed for wheezing or shortness of breath., Disp: 1 each, Rfl: 0   Cholecalciferol (VITAMIN D) 2000 UNITS CAPS, Take 2,000 Units by mouth daily., Disp: , Rfl:    fexofenadine (ALLEGRA) 60 MG tablet, Take 60 mg by mouth daily., Disp: , Rfl:    fluticasone (FLONASE) 50 MCG/ACT nasal spray, Place 2 sprays into both  nostrils daily., Disp: 16 g, Rfl: 6   furosemide (LASIX) 20 MG tablet, Take 1 tablet (20 mg total) by mouth daily., Disp: 30 tablet, Rfl: 0   hyoscyamine (LEVSIN SL) 0.125 MG SL tablet, Place 1 tablet (0.125 mg total) under the tongue 2 (two) times daily as needed (for urgency and loose stools)., Disp: 60 tablet, Rfl: 6   Iron, Ferrous Sulfate, 325 (65 Fe) MG TABS, Take 325 mg by mouth every other day., Disp: 45 tablet, Rfl: 2   levothyroxine (SYNTHROID) 50 MCG tablet, Take 1 tablet by mouth once daily, Disp: 90 tablet, Rfl: 2   Menthol, Topical Analgesic, (BIOFREEZE EX), Apply topically., Disp: , Rfl:    Multiple Vitamins-Minerals (MULTIVITAMIN WITH MINERALS) tablet, Take 1 tablet by mouth daily., Disp: , Rfl:    nystatin (MYCOSTATIN/NYSTOP) powder, Apply topically 2 (two) times daily as needed. Beneath both breasts, Disp: 15 g, Rfl: 0   OXYQUINOLONE SULFATE VAGINAL (TRIMO-SAN) 0.025 % GEL, Use one applicator full vaginally 2 x a week at hs, Disp: 113.4 g, Rfl: 1   triamcinolone ointment (KENALOG) 0.1 %, Apply once daily to hands as needed for hand eczema only., Disp: 30 g, Rfl: 2   lisinopril (ZESTRIL) 20 MG tablet, Take 1 tablet (20 mg total) by mouth at bedtime., Disp: 90 tablet, Rfl: 0   Objective:     BP 128/70 (BP Location: Right Arm, Patient Position: Sitting, Cuff Size: Normal)   Pulse (!) 51   Temp  98.1 F (36.7 C) (Temporal)   Ht 5\' 4"  (1.626 m)   Wt 183 lb 3.2 oz (83.1 kg)   SpO2 97%   BMI 31.45 kg/m    Physical Exam Constitutional:      General: She is not in acute distress.    Appearance: Normal appearance. She is not ill-appearing, toxic-appearing or diaphoretic.  HENT:     Head: Normocephalic and atraumatic.     Right Ear: External ear normal.     Left Ear: External ear normal.  Eyes:     General: No scleral icterus.       Right eye: No discharge.        Left eye: No discharge.     Extraocular Movements: Extraocular movements intact.     Conjunctiva/sclera:  Conjunctivae normal.  Cardiovascular:     Rate and Rhythm: Normal rate and regular rhythm.  Pulmonary:     Effort: Pulmonary effort is normal. No respiratory distress.     Breath sounds: No wheezing, rhonchi or rales.  Musculoskeletal:     Right lower leg: Swelling present. No edema.     Left lower leg: Swelling present. No edema.     Comments: Trace to 1+ edema.  There is generalized swelling.  Skin:    General: Skin is warm and dry.  Neurological:     Mental Status: She is alert and oriented to person, place, and time.  Psychiatric:        Mood and Affect: Mood normal.        Behavior: Behavior normal.      No results found for any visits on 03/10/23.    The 10-year ASCVD risk score (Arnett DK, et al., 2019) is: 26.1%    Assessment & Plan:   Benign essential HTN -     Lisinopril; Take 1 tablet (20 mg total) by mouth at bedtime.  Dispense: 90 tablet; Refill: 0  Bilateral lower extremity edema    No follow-ups on file.  Will continue Lasix.  Will be careful when standing.  Continue to hydrate well.  Suggested that she check and record blood pressures.  Has follow-up scheduled for the 19th.  No evidence for CHF, liver or kidney disease.  Mliss Sax, MD

## 2023-03-24 ENCOUNTER — Ambulatory Visit: Payer: Medicare HMO | Admitting: Family Medicine

## 2023-03-30 ENCOUNTER — Encounter: Payer: Self-pay | Admitting: Family Medicine

## 2023-03-30 ENCOUNTER — Ambulatory Visit (INDEPENDENT_AMBULATORY_CARE_PROVIDER_SITE_OTHER): Payer: Medicare HMO | Admitting: Family Medicine

## 2023-03-30 VITALS — BP 136/70 | HR 47 | Temp 97.8°F | Ht 64.0 in | Wt 182.0 lb

## 2023-03-30 DIAGNOSIS — R600 Localized edema: Secondary | ICD-10-CM | POA: Diagnosis not present

## 2023-03-30 DIAGNOSIS — D649 Anemia, unspecified: Secondary | ICD-10-CM

## 2023-03-30 DIAGNOSIS — D229 Melanocytic nevi, unspecified: Secondary | ICD-10-CM | POA: Insufficient documentation

## 2023-03-30 DIAGNOSIS — R7303 Prediabetes: Secondary | ICD-10-CM

## 2023-03-30 DIAGNOSIS — E559 Vitamin D deficiency, unspecified: Secondary | ICD-10-CM | POA: Diagnosis not present

## 2023-03-30 DIAGNOSIS — E7800 Pure hypercholesterolemia, unspecified: Secondary | ICD-10-CM | POA: Diagnosis not present

## 2023-03-30 LAB — URINALYSIS, ROUTINE W REFLEX MICROSCOPIC
Bilirubin Urine: NEGATIVE
Hgb urine dipstick: NEGATIVE
Ketones, ur: NEGATIVE
Leukocytes,Ua: NEGATIVE
Nitrite: NEGATIVE
RBC / HPF: NONE SEEN (ref 0–?)
Specific Gravity, Urine: 1.01 (ref 1.000–1.030)
Total Protein, Urine: NEGATIVE
Urine Glucose: NEGATIVE
Urobilinogen, UA: 0.2 (ref 0.0–1.0)
pH: 6 (ref 5.0–8.0)

## 2023-03-30 LAB — LIPID PANEL
Cholesterol: 160 mg/dL (ref 0–200)
HDL: 61 mg/dL (ref >39.00)
NonHDL: 99.41
Total CHOL/HDL Ratio: 3
Triglycerides: 224 mg/dL — ABNORMAL HIGH (ref 0.0–149.0)
VLDL: 44.8 mg/dL — ABNORMAL HIGH (ref 0.0–40.0)

## 2023-03-30 LAB — COMPREHENSIVE METABOLIC PANEL
ALT: 10 U/L (ref 0–35)
AST: 21 U/L (ref 0–37)
Albumin: 4.2 g/dL (ref 3.5–5.2)
Alkaline Phosphatase: 50 U/L (ref 39–117)
BUN: 13 mg/dL (ref 6–23)
CO2: 29 mEq/L (ref 19–32)
Calcium: 9.6 mg/dL (ref 8.4–10.5)
Chloride: 105 mEq/L (ref 96–112)
Creatinine, Ser: 0.84 mg/dL (ref 0.40–1.20)
GFR: 66.73 mL/min (ref >60.00)
Glucose, Bld: 89 mg/dL (ref 70–99)
Potassium: 4 mEq/L (ref 3.5–5.1)
Sodium: 142 mEq/L (ref 135–145)
Total Bilirubin: 0.7 mg/dL (ref 0.2–1.2)
Total Protein: 7.1 g/dL (ref 6.0–8.3)

## 2023-03-30 LAB — VITAMIN D 25 HYDROXY (VIT D DEFICIENCY, FRACTURES): VITD: 40.34 ng/mL (ref 30.00–100.00)

## 2023-03-30 LAB — CBC
HCT: 37.4 % (ref 36.0–46.0)
Hemoglobin: 12.2 g/dL (ref 12.0–15.0)
MCHC: 32.7 g/dL (ref 30.0–36.0)
MCV: 93.6 fl (ref 78.0–100.0)
Platelets: 207 10*3/uL (ref 150.0–400.0)
RBC: 3.99 Mil/uL (ref 3.87–5.11)
RDW: 13.9 % (ref 11.5–15.5)
WBC: 5.2 10*3/uL (ref 4.0–10.5)

## 2023-03-30 LAB — HEMOGLOBIN A1C: Hgb A1c MFr Bld: 5.7 % (ref 4.6–6.5)

## 2023-03-30 LAB — LDL CHOLESTEROL, DIRECT: Direct LDL: 54 mg/dL

## 2023-03-30 NOTE — Progress Notes (Signed)
Established Patient Office Visit   Subjective:  Patient ID: [name], female    DOB: [birth year]  Age: 77 y.o. MRN: [id number]  Chief Complaint  Patient presents with   Medical Management of Chronic Issues    6 month follow up/recheck swelling in legs. Discuss multiply moles on body would like referral to dermatology. Patient fasting.     HPI Encounter Diagnoses  Name Primary?   Normocytic anemia Yes   Bilateral lower extremity edema    Atypical nevi    Prediabetes    Elevated cholesterol    Vitamin D deficiency    For follow-up of above.  Continues every other day iron and multi vitamin for normocytic anemia.  There is been no blood in her stool or urine.  Continues Lasix for swelling in lower extremities.  She has had bouts of lightheadedness.  Legs have been mildly tender in places.  Kidney, liver and BNP have all been normal.  continues 50 mcg of levothyroxine for hypothyroidism.  She is fasting for a lipid profile.  Her daughter's been concerned about some new moles that have been involved.   Review of Systems  Constitutional: Negative.   HENT: Negative.    Eyes:  Negative for blurred vision, discharge and redness.  Respiratory: Negative.    Cardiovascular: Negative.   Gastrointestinal:  Negative for abdominal pain.  Genitourinary: Negative.   Musculoskeletal: Negative.  Negative for myalgias.  Skin:  Negative for rash.  Neurological:  Negative for tingling, loss of consciousness and weakness.  Endo/Heme/Allergies:  Negative for polydipsia.     Current Outpatient Medications:    acetaminophen (TYLENOL) 500 MG tablet, Take 500-1,000 mg by mouth as needed for mild pain or moderate pain., Disp: , Rfl:    albuterol (PROAIR HFA) 108 (90 Base) MCG/ACT inhaler, Inhale 2 puffs into the lungs every 6 (six) hours as needed for wheezing or shortness of breath., Disp: 1 each, Rfl: 0   Cholecalciferol (VITAMIN D) 2000 UNITS CAPS, Take 2,000 Units by mouth daily., Disp: ,  Rfl:    fexofenadine (ALLEGRA) 60 MG tablet, Take 60 mg by mouth daily., Disp: , Rfl:    fluticasone (FLONASE) 50 MCG/ACT nasal spray, Place 2 sprays into both nostrils daily., Disp: 16 g, Rfl: 6   hyoscyamine (LEVSIN SL) 0.125 MG SL tablet, Place 1 tablet (0.125 mg total) under the tongue 2 (two) times daily as needed (for urgency and loose stools)., Disp: 60 tablet, Rfl: 6   Iron, Ferrous Sulfate, 325 (65 Fe) MG TABS, Take 325 mg by mouth every other day., Disp: 45 tablet, Rfl: 2   levothyroxine (SYNTHROID) 50 MCG tablet, Take 1 tablet by mouth once daily, Disp: 90 tablet, Rfl: 2   lisinopril (ZESTRIL) 20 MG tablet, Take 1 tablet (20 mg total) by mouth at bedtime., Disp: 90 tablet, Rfl: 0   Menthol, Topical Analgesic, (BIOFREEZE EX), Apply topically., Disp: , Rfl:    Multiple Vitamins-Minerals (MULTIVITAMIN WITH MINERALS) tablet, Take 1 tablet by mouth daily., Disp: , Rfl:    nystatin (MYCOSTATIN/NYSTOP) powder, Apply topically 2 (two) times daily as needed. Beneath both breasts, Disp: 15 g, Rfl: 0   OXYQUINOLONE SULFATE VAGINAL (TRIMO-SAN) 0.025 % GEL, Use one applicator full vaginally 2 x a week at hs, Disp: 113.4 g, Rfl: 1   triamcinolone ointment (KENALOG) 0.1 %, Apply once daily to hands as needed for hand eczema only., Disp: 30 g, Rfl: 2   Objective:     BP 136/70 (BP Location: Right Arm,  Patient Position: Sitting, Cuff Size: Normal)   Pulse (!) 47   Temp 97.8 F (36.6 C) (Temporal)   Ht 5\' 4"  (1.626 m)   Wt 182 lb (82.6 kg)   SpO2 98%   BMI 31.24 kg/m    Physical Exam Constitutional:      General: She is not in acute distress.    Appearance: Normal appearance. She is not ill-appearing, toxic-appearing or diaphoretic.  HENT:     Head: Normocephalic and atraumatic.     Right Ear: External ear normal.     Left Ear: External ear normal.     Mouth/Throat:     Mouth: Mucous membranes are moist.     Pharynx: Oropharynx is clear. No oropharyngeal exudate or posterior  oropharyngeal erythema.  Eyes:     General: No scleral icterus.       Right eye: No discharge.        Left eye: No discharge.     Extraocular Movements: Extraocular movements intact.     Conjunctiva/sclera: Conjunctivae normal.     Pupils: Pupils are equal, round, and reactive to light.  Cardiovascular:     Rate and Rhythm: Normal rate and regular rhythm.  Pulmonary:     Effort: Pulmonary effort is normal. No respiratory distress.     Breath sounds: Normal breath sounds.  Abdominal:     General: Bowel sounds are normal.     Tenderness: There is no abdominal tenderness. There is no guarding.  Musculoskeletal:     Cervical back: No rigidity or tenderness.       Legs:  Skin:    General: Skin is warm and dry.     Capillary Refill: Capillary refill takes less than 2 seconds.       Neurological:     Mental Status: She is alert and oriented to person, place, and time.  Psychiatric:        Mood and Affect: Mood normal.        Behavior: Behavior normal.      No results found for any visits on 03/30/23.    The 10-year ASCVD risk score (Arnett DK, et al., 2019) is: 32.3%    Assessment & Plan:   Normocytic anemia -     CBC  Bilateral lower extremity edema -     Ambulatory referral to Vascular Surgery  Atypical nevi -     Ambulatory referral to Dermatology  Prediabetes -     Hemoglobin A1c -     Urinalysis, Routine w reflex microscopic  Elevated cholesterol -     Lipid panel -     Comprehensive metabolic panel  Vitamin D deficiency -     VITAMIN D 25 Hydroxy (Vit-D Deficiency, Fractures)    Return in about 3 months (around 06/30/2023).  Have discontinued Lasix secondary to lightheadedness.  Pain and vascular referral for second opinion.  Suggested that some type of support stockings may be helpful.  She would like to see a dermatologist.  Rechecking cholesterol and A1c.  Mliss Sax, MD

## 2023-04-05 ENCOUNTER — Ambulatory Visit: Payer: Medicare HMO | Admitting: Podiatry

## 2023-04-05 ENCOUNTER — Encounter: Payer: Self-pay | Admitting: Podiatry

## 2023-04-05 DIAGNOSIS — B351 Tinea unguium: Secondary | ICD-10-CM

## 2023-04-05 DIAGNOSIS — M79675 Pain in left toe(s): Secondary | ICD-10-CM

## 2023-04-05 DIAGNOSIS — M79674 Pain in right toe(s): Secondary | ICD-10-CM | POA: Diagnosis not present

## 2023-04-05 NOTE — Progress Notes (Signed)
This patient returns to the office for evaluation and treatment of long thick painful nails .  This patient is unable to trim her own nails since the patient cannot reach her feet.  Patient says the nails are painful walking and wearing his shoes.  She returns for preventive foot care services.  General Appearance  Alert, conversant and in no acute stress.  Vascular  Dorsalis pedis and posterior tibial  pulses are palpable  bilaterally.  Capillary return is within normal limits  bilaterally. Temperature is within normal limits  bilaterally.  Neurologic  Senn-Weinstein monofilament wire test within normal limits  bilaterally. Muscle power within normal limits bilaterally.  Nails Thick disfigured discolored nails with subungual debris  from hallux to fifth toes bilaterally. No evidence of bacterial infection or drainage bilaterally.  Orthopedic  No limitations of motion  feet .  No crepitus or effusions noted.  No bony pathology or digital deformities noted.  HAV  B/L.  Skin  normotropic skin with no porokeratosis noted bilaterally.  No signs of infections or ulcers noted.   Asymptomatic callus sub 5th  metabase right foot.  Onychomycosis  Pain in toes right foot  Pain in toes left foot  Debridement  of nails  1-5  B/L with a nail nipper.  Nails were then filed using a dremel tool with no incidents.    RTC 3 months    Muaz Shorey DPM  

## 2023-05-26 ENCOUNTER — Encounter (INDEPENDENT_AMBULATORY_CARE_PROVIDER_SITE_OTHER): Payer: Self-pay

## 2023-05-27 ENCOUNTER — Encounter: Payer: Medicare HMO | Admitting: Vascular Surgery

## 2023-05-27 ENCOUNTER — Encounter (HOSPITAL_COMMUNITY): Payer: Medicare HMO

## 2023-06-02 ENCOUNTER — Other Ambulatory Visit: Payer: Self-pay

## 2023-06-02 DIAGNOSIS — R609 Edema, unspecified: Secondary | ICD-10-CM

## 2023-06-10 ENCOUNTER — Ambulatory Visit (HOSPITAL_COMMUNITY)
Admission: RE | Admit: 2023-06-10 | Discharge: 2023-06-10 | Disposition: A | Discharge status: Home / Self Care | Payer: Medicare HMO | Source: Ambulatory Visit | Attending: Vascular Surgery | Admitting: Vascular Surgery

## 2023-06-10 ENCOUNTER — Encounter: Payer: Self-pay | Admitting: Vascular Surgery

## 2023-06-10 ENCOUNTER — Ambulatory Visit: Payer: Medicare HMO | Admitting: Vascular Surgery

## 2023-06-10 VITALS — BP 145/69 | HR 47 | Temp 97.8°F | Resp 20 | Ht 64.0 in | Wt 182.5 lb

## 2023-06-10 DIAGNOSIS — R609 Edema, unspecified: Secondary | ICD-10-CM | POA: Diagnosis not present

## 2023-06-10 DIAGNOSIS — M7989 Other specified soft tissue disorders: Secondary | ICD-10-CM

## 2023-06-10 DIAGNOSIS — R600 Localized edema: Secondary | ICD-10-CM | POA: Diagnosis not present

## 2023-06-10 NOTE — Progress Notes (Signed)
Office Note     CC: Bilateral lower extremity edema, left greater than right Requesting Provider:  Mliss Sax,*  HPI: [name] is a 77 y.o. ([birth year]) female who presents at the request of Mliss Sax, MD for evaluation of bilateral lower extremity edema, left greater than right.  On exam, [name] was doing well.  Originally from [place], she has lived here for the majority of her life.  She worked several different jobs prior to [employer] where she retired after several decades.  She is a mother of 3 girls who live in neighboring states.  Her oldest, lives with her.  In May of this year Elvia awoke with bilateral lower extremity edema extending onto the dorsal aspects of both feet.  This edema resolved, however she was worried due to the significant swelling.  She denies history of trauma, infection.  She has severe arthritis in the left knee, but is trying to hold off on knee replacement at this time.  [name] denies waxing and waning edema that is worse by days end.  She denies heavy, tired feeling in her legs.  Denies bleeding or ulceration of varicose veins.  No telangiectasias.  She has not tried compression stockings.  No previous venous procedures.  No history of DVT.  No recent echo, no recent change in medications    Past Medical History:  Diagnosis Date   Allergy    Arthritis    Asthma    mild   Blood transfusion without reported diagnosis    H/O bronchitis    Hemorrhoid    Hx of blood clots 1967   from birth control in LUE   Hypertension    Hypoglycemia    Hypothyroidism    Migraine    from food   Osteopenia 07/04/2019   Pneumonia    hx of   Seasonal allergies    Sleep apnea    CPAP   UTI (urinary tract infection)     Past Surgical History:  Procedure Laterality Date   CATARACT EXTRACTION     rt eye   COLONOSCOPY  05/13/2008   Christella Hartigan   detatched retina  2003   rt eye   TOTAL HIP ARTHROPLASTY Right  09/19/2013   Procedure: RIGHT TOTAL HIP ARTHROPLASTY;  Surgeon: Jacki Cones, MD;  Location: [facility];  Service: Orthopedics;  Laterality: Right;    Social History   Socioeconomic History   Marital status: Legally Separated    Spouse name: Not on file   Number of children: Not on file   Years of education: Not on file   Highest education level: Not on file  Occupational History   Not on file  Tobacco Use   Smoking status: Former    Current packs/day: 0.00    Average packs/day: 3.0 packs/day for 13.0 years (39.0 ttl pk-yrs)    Types: Cigarettes    Start date: 10/11/1961    Quit date: 10/11/1974    Years since quitting: 48.6   Smokeless tobacco: Never  Vaping Use   Vaping status: Never Used  Substance and Sexual Activity   Alcohol use: No    Alcohol/week: 0.0 standard drinks of alcohol   Drug use: No   Sexual activity: Not Currently  Other Topics Concern   Not on file  Social History Narrative   Not on file   Social Determinants of Health   Financial Resource Strain: Low Risk  (01/28/2023)   Overall Financial Resource Strain (CARDIA)  Difficulty of Paying Living Expenses: Not hard at all  Food Insecurity: No Food Insecurity (01/28/2023)   Hunger Vital Sign    Worried About Running Out of Food in the Last Year: Never true    Ran Out of Food in the Last Year: Never true  Transportation Needs: No Transportation Needs (01/28/2023)   PRAPARE - Administrator, Civil Service (Medical): No    Lack of Transportation (Non-Medical): No  Physical Activity: Inactive (01/28/2023)   Exercise Vital Sign    Days of Exercise per Week: 0 days    Minutes of Exercise per Session: 0 min  Stress: No Stress Concern Present (01/28/2023)   [employer] of Occupational Health - Occupational Stress Questionnaire    Feeling of Stress : Only a little  Social Connections: Moderately Isolated (03/02/2022)   Social Connection and Isolation Panel [NHANES]    Frequency of Communication  with Friends and Family: Twice a week    Frequency of Social Gatherings with Friends and Family: Twice a week    Attends Religious Services: 1 to 4 times per year    Active Member of [employer] or Organizations: No    Attends Banker Meetings: Never    Marital Status: Separated  Intimate Partner Violence: Not At Risk (03/02/2022)   Humiliation, Afraid, Rape, and Kick questionnaire    Fear of Current or Ex-Partner: No    Emotionally Abused: No    Physically Abused: No    Sexually Abused: No   Family History  Problem Relation Age of Onset   Thyroid disease Mother    Osteoarthritis Mother    Thyroid disease Father    Osteoarthritis Father    Osteoarthritis Maternal Grandmother    Colon cancer Maternal Grandmother 67   Osteoarthritis Maternal Grandfather    Osteoarthritis Paternal Grandmother    Esophageal cancer Neg Hx    Rectal cancer Neg Hx    Stomach cancer Neg Hx    Colon polyps Neg Hx     Current Outpatient Medications  Medication Sig Dispense Refill   acetaminophen (TYLENOL) 500 MG tablet Take 500-1,000 mg by mouth as needed for mild pain or moderate pain.     albuterol (PROAIR HFA) 108 (90 Base) MCG/ACT inhaler Inhale 2 puffs into the lungs every 6 (six) hours as needed for wheezing or shortness of breath. 1 each 0   Cholecalciferol (VITAMIN D) 2000 UNITS CAPS Take 2,000 Units by mouth daily.     fexofenadine (ALLEGRA) 60 MG tablet Take 60 mg by mouth daily.     fluticasone (FLONASE) 50 MCG/ACT nasal spray Place 2 sprays into both nostrils daily. 16 g 6   hyoscyamine (LEVSIN SL) 0.125 MG SL tablet Place 1 tablet (0.125 mg total) under the tongue 2 (two) times daily as needed (for urgency and loose stools). 60 tablet 6   Iron, Ferrous Sulfate, 325 (65 Fe) MG TABS Take 325 mg by mouth every other day. 45 tablet 2   levothyroxine (SYNTHROID) 50 MCG tablet Take 1 tablet by mouth once daily 90 tablet 2   lisinopril (ZESTRIL) 20 MG tablet Take 1 tablet (20 mg total) by  mouth at bedtime. 90 tablet 0   Menthol, Topical Analgesic, (BIOFREEZE EX) Apply topically.     Multiple Vitamins-Minerals (MULTIVITAMIN WITH MINERALS) tablet Take 1 tablet by mouth daily.     nystatin (MYCOSTATIN/NYSTOP) powder Apply topically 2 (two) times daily as needed. Beneath both breasts 15 g 0   OXYQUINOLONE SULFATE VAGINAL (TRIMO-SAN) 0.025 %  GEL Use one applicator full vaginally 2 x a week at hs 113.4 g 1   triamcinolone ointment (KENALOG) 0.1 % Apply once daily to hands as needed for hand eczema only. 30 g 2   No current facility-administered medications for this visit.    Allergies  Allergen Reactions   Codeine Hives and Nausea And Vomiting   Pork-Derived Products Other (See Comments)    Migraines w/ pork and other processed meats.   Hydrocodone Other (See Comments)   Prednisone Other (See Comments)    Causes more pain     REVIEW OF SYSTEMS:  [X]  denotes positive finding, [ ]  denotes negative finding Cardiac  Comments:  Chest pain or chest pressure:    Shortness of breath upon exertion:    Short of breath when lying flat:    Irregular heart rhythm:        Vascular    Pain in calf, thigh, or hip brought on by ambulation:    Pain in feet at night that wakes you up from your sleep:     Blood clot in your veins:    Leg swelling:         Pulmonary    Oxygen at home:    Productive cough:     Wheezing:         Neurologic    Sudden weakness in arms or legs:     Sudden numbness in arms or legs:     Sudden onset of difficulty speaking or slurred speech:    Temporary loss of vision in one eye:     Problems with dizziness:         Gastrointestinal    Blood in stool:     Vomited blood:         Genitourinary    Burning when urinating:     Blood in urine:        Psychiatric    Major depression:         Hematologic    Bleeding problems:    Problems with blood clotting too easily:        Skin    Rashes or ulcers:        Constitutional    Fever or chills:       PHYSICAL EXAMINATION:  Vitals:   06/10/23 1327  BP: (!) 145/69  Pulse: (!) 47  Resp: 20  Temp: 97.8 F (36.6 C)  SpO2: 95%  Weight: 182 lb 8 oz (82.8 kg)  Height: 5\' 4"  (1.626 m)    General:  WDWN in NAD; vital signs documented above Gait: Not observed HENT: WNL, normocephalic Pulmonary: normal non-labored breathing , without Rales, rhonchi,  wheezing Cardiac: regular HR Abdomen: soft, NT, no masses Skin: without rashes Vascular Exam/Pulses:  Right Left  Radial 2+ (normal) 2+ (normal)  Ulnar    Femoral    Popliteal    DP 2+ (normal) 2+ (normal)  PT     Extremities: without ischemic changes, without Gangrene , without cellulitis; without open wounds;  Edema worse in the left lower extremity compared to the right.  Some fibrosis.  Nonpitting Musculoskeletal: no muscle wasting or atrophy  Neurologic: A&O X 3;  No focal weakness or paresthesias are detected Psychiatric:  The pt has Normal affect.   Non-Invasive Vascular Imaging:   +--------------+---------+------+-----------+------------+-------------+  LEFT         Reflux NoRefluxReflux TimeDiameter cmsComments  Yes                                        +--------------+---------+------+-----------+------------+-------------+  CFV                    yes   >1 second                            +--------------+---------+------+-----------+------------+-------------+  FV mid        no                                                   +--------------+---------+------+-----------+------------+-------------+  Popliteal    no                                                   +--------------+---------+------+-----------+------------+-------------+  GSV at SFJ              yes    >500 ms      .647                   +--------------+---------+------+-----------+------------+-------------+  GSV prox thighno                            .402                    +--------------+---------+------+-----------+------------+-------------+  GSV mid thigh no                            .308    out of fascia  +--------------+---------+------+-----------+------------+-------------+  GSV dist thighno                            .292    out of fascia  +--------------+---------+------+-----------+------------+-------------+  GSV at knee                                 .292    out of fascia  +--------------+---------+------+-----------+------------+-------------+  SSV Pop Fossa no                            .239                   +--------------+---------+------+-----------+------------+-------------+  SSV prox calf no                            .260                   +--------------+---------+------+-----------+------------+-------------+     ASSESSMENT/PLAN:: 77 y.o. female presenting with bilateral lower extremity edema, right greater than left.  This edema is appreciated especially at the ankle on the left leg.  The edema is nonpitting, and there is an element of fibrosis.  Fortunately, [name] is relatively asymptomatic.  Venous duplex ultrasound of the left  lower extremity was reviewed demonstrating no significant venous reflux.  Small amount of reflux at the saphenofemoral junction and common femoral vein, otherwise no venous insufficiency.  I do not think she would have significant improvement with venous ablation.  Current distribution of edema is not consistent with lymphedema, nor lipedema.  Regardless, would be best served with compression stockings.  We discussed the above at length, and Monick was honest that she has trouble with her hands and dexterity.  I pulled up a sock Donner on her phone that she can order.  We also discussed a lighter amount of compression-15 to 20 mmHg which are easier to apply.  Remas Debarros can follow-up with me as needed.  I asked her to call my office should any changes  arise.   Victorino Sparrow, MD [facility] [phone] Total time of patient care including pre-visit research, consultation, and documentation greater than 45 minutes

## 2023-06-24 ENCOUNTER — Telehealth: Payer: Self-pay | Admitting: Family Medicine

## 2023-06-24 ENCOUNTER — Other Ambulatory Visit: Payer: Self-pay | Admitting: Family Medicine

## 2023-06-24 DIAGNOSIS — I10 Essential (primary) hypertension: Secondary | ICD-10-CM

## 2023-06-24 NOTE — Telephone Encounter (Signed)
06/24/23 - Pt called saying She has only 1 dosage left for her med. Pharmacy needs prior authorization for the medication lisinopril (ZESTRIL) 20 MG tablet [[id number]]. Before she can pick it up.

## 2023-06-27 ENCOUNTER — Telehealth: Payer: Self-pay

## 2023-06-27 ENCOUNTER — Other Ambulatory Visit (HOSPITAL_COMMUNITY): Payer: Self-pay

## 2023-06-27 NOTE — Telephone Encounter (Signed)
Pharmacy Patient Advocate Encounter   Received notification from Pt Calls Messages that prior authorization for Lisinopril 20mg  is required/requested.   Insurance verification completed.   The patient is insured through [employer] .   Per test claim: Refill too soon. PA is not needed at this time. Medication was filled 06/24/23. Next eligible fill date is 08/26/23.   Placed a call to [facility] and the pharmacist stated the medication is filled and ready to be picked up. The copay is $0.

## 2023-06-29 NOTE — Telephone Encounter (Signed)
No Pa was need for patient Lisinopril medication and the pharmacy said that patients medication is ready for pick up.

## 2023-06-30 ENCOUNTER — Encounter: Payer: Self-pay | Admitting: Family Medicine

## 2023-06-30 ENCOUNTER — Ambulatory Visit (INDEPENDENT_AMBULATORY_CARE_PROVIDER_SITE_OTHER): Payer: Medicare HMO | Admitting: Family Medicine

## 2023-06-30 VITALS — BP 148/84 | HR 47 | Temp 97.5°F | Ht 64.0 in | Wt 183.2 lb

## 2023-06-30 DIAGNOSIS — R7303 Prediabetes: Secondary | ICD-10-CM | POA: Diagnosis not present

## 2023-06-30 DIAGNOSIS — I10 Essential (primary) hypertension: Secondary | ICD-10-CM

## 2023-06-30 DIAGNOSIS — Z23 Encounter for immunization: Secondary | ICD-10-CM | POA: Diagnosis not present

## 2023-06-30 DIAGNOSIS — E7800 Pure hypercholesterolemia, unspecified: Secondary | ICD-10-CM

## 2023-06-30 LAB — BASIC METABOLIC PANEL
BUN: 13 mg/dL (ref 6–23)
CO2: 30 mEq/L (ref 19–32)
Calcium: 9.5 mg/dL (ref 8.4–10.5)
Chloride: 107 mEq/L (ref 96–112)
Creatinine, Ser: 0.72 mg/dL (ref 0.40–1.20)
GFR: 80.15 mL/min (ref >60.00)
Glucose, Bld: 85 mg/dL (ref 70–99)
Potassium: 4.1 mEq/L (ref 3.5–5.1)
Sodium: 144 mEq/L (ref 135–145)

## 2023-06-30 LAB — LIPID PANEL
Cholesterol: 155 mg/dL (ref 0–200)
HDL: 82.8 mg/dL (ref >39.00)
LDL Cholesterol: 53 mg/dL (ref 0–99)
NonHDL: 72.43
Total CHOL/HDL Ratio: 2
Triglycerides: 99 mg/dL (ref 0.0–149.0)
VLDL: 19.8 mg/dL (ref 0.0–40.0)

## 2023-06-30 LAB — HEMOGLOBIN A1C: Hgb A1c MFr Bld: 5.7 % (ref 4.6–6.5)

## 2023-06-30 NOTE — Progress Notes (Signed)
Established Patient Office Visit   Subjective:  Patient ID: [name], female    DOB: 1946-05-25  Age: 77 y.o. MRN: [id number]  Chief Complaint  Patient presents with   Medical Management of Chronic Issues    3 month follow up. Pt is fasting. Complains of extra fat in abdomen.     HPI Encounter Diagnoses  Name Primary?   Need for immunization against influenza Yes   Prediabetes    Benign essential HTN    Elevated cholesterol    For follow-up of above.  She did see the vascular and vein specialist.   Review of Systems  Constitutional: Negative.   HENT: Negative.    Eyes:  Negative for blurred vision, discharge and redness.  Respiratory: Negative.    Cardiovascular: Negative.   Gastrointestinal:  Negative for abdominal pain.  Genitourinary: Negative.   Musculoskeletal: Negative.  Negative for myalgias.  Skin:  Negative for rash.  Neurological:  Negative for tingling, loss of consciousness and weakness.  Endo/Heme/Allergies:  Negative for polydipsia.     Current Outpatient Medications:    acetaminophen (TYLENOL) 500 MG tablet, Take 500-1,000 mg by mouth as needed for mild pain or moderate pain., Disp: , Rfl:    albuterol (PROAIR HFA) 108 (90 Base) MCG/ACT inhaler, Inhale 2 puffs into the lungs every 6 (six) hours as needed for wheezing or shortness of breath., Disp: 1 each, Rfl: 0   Cholecalciferol (VITAMIN D) 2000 UNITS CAPS, Take 2,000 Units by mouth daily., Disp: , Rfl:    fexofenadine (ALLEGRA) 60 MG tablet, Take 60 mg by mouth daily., Disp: , Rfl:    fluticasone (FLONASE) 50 MCG/ACT nasal spray, Place 2 sprays into both nostrils daily., Disp: 16 g, Rfl: 6   hyoscyamine (LEVSIN SL) 0.125 MG SL tablet, Place 1 tablet (0.125 mg total) under the tongue 2 (two) times daily as needed (for urgency and loose stools)., Disp: 60 tablet, Rfl: 6   Iron, Ferrous Sulfate, 325 (65 Fe) MG TABS, Take 325 mg by mouth every other day., Disp: 45 tablet, Rfl: 2   levothyroxine  (SYNTHROID) 50 MCG tablet, Take 1 tablet by mouth once daily, Disp: 90 tablet, Rfl: 2   lisinopril (ZESTRIL) 20 MG tablet, TAKE 1 TABLET BY MOUTH AT BEDTIME, Disp: 90 tablet, Rfl: 3   Menthol, Topical Analgesic, (BIOFREEZE EX), Apply topically., Disp: , Rfl:    Multiple Vitamins-Minerals (MULTIVITAMIN WITH MINERALS) tablet, Take 1 tablet by mouth daily., Disp: , Rfl:    nystatin (MYCOSTATIN/NYSTOP) powder, Apply topically 2 (two) times daily as needed. Beneath both breasts, Disp: 15 g, Rfl: 0   OXYQUINOLONE SULFATE VAGINAL (TRIMO-SAN) 0.025 % GEL, Use one applicator full vaginally 2 x a week at hs, Disp: 113.4 g, Rfl: 1   triamcinolone ointment (KENALOG) 0.1 %, Apply once daily to hands as needed for hand eczema only., Disp: 30 g, Rfl: 2   Objective:     BP (!) 148/84   Pulse (!) 47   Temp (!) 97.5 F (36.4 C)   Ht 5\' 4"  (1.626 m)   Wt 183 lb 3.2 oz (83.1 kg)   SpO2 99%   BMI 31.45 kg/m  BP Readings from Last 3 Encounters:  06/30/23 (!) 148/84  06/10/23 (!) 145/69  03/30/23 136/70   Wt Readings from Last 3 Encounters:  06/30/23 183 lb 3.2 oz (83.1 kg)  06/10/23 182 lb 8 oz (82.8 kg)  03/30/23 182 lb (82.6 kg)      Physical Exam Constitutional:  General: She is not in acute distress.    Appearance: Normal appearance. She is not ill-appearing, toxic-appearing or diaphoretic.  HENT:     Head: Normocephalic and atraumatic.     Right Ear: External ear normal.     Left Ear: External ear normal.  Eyes:     General: No scleral icterus.       Right eye: No discharge.        Left eye: No discharge.     Extraocular Movements: Extraocular movements intact.     Conjunctiva/sclera: Conjunctivae normal.  Cardiovascular:     Rate and Rhythm: Normal rate and regular rhythm.  Pulmonary:     Effort: Pulmonary effort is normal. No respiratory distress.     Breath sounds: No wheezing, rhonchi or rales.  Musculoskeletal:     Left lower leg: Edema present.  Skin:    General: Skin  is warm and dry.  Neurological:     Mental Status: She is alert and oriented to person, place, and time.  Psychiatric:        Mood and Affect: Mood normal.        Behavior: Behavior normal.      No results found for any visits on 06/30/23.    The 10-year ASCVD risk score (Arnett DK, et al., 2019) is: 36.5%    Assessment & Plan:   Need for immunization against influenza -     Flu Vaccine Trivalent High Dose (Fluad)  Prediabetes -     Basic metabolic panel -     Hemoglobin A1c  Benign essential HTN -     Basic metabolic panel  Elevated cholesterol -     Lipid panel    Return in about 3 months (around 09/29/2023) for chronic disease follow-up.  Encouraged patient to decrease sodium in the diet as this could help the swelling in her legs and her blood pressure.  Information was given on managing hypertension and elevated cholesterol.  Information was given on prediabetes and sample eating plan.  Continue with the lower compression stockings.  Mliss Sax, MD

## 2023-07-04 ENCOUNTER — Telehealth: Payer: Self-pay | Admitting: Family Medicine

## 2023-07-04 NOTE — Telephone Encounter (Signed)
Pt is wanting her most recent lab results printed off for her to pick up. Please advise pt at [phone] when completed.

## 2023-07-07 ENCOUNTER — Ambulatory Visit: Payer: Medicare HMO | Admitting: Podiatry

## 2023-07-07 ENCOUNTER — Encounter: Payer: Self-pay | Admitting: Podiatry

## 2023-07-07 DIAGNOSIS — M79674 Pain in right toe(s): Secondary | ICD-10-CM

## 2023-07-07 DIAGNOSIS — M79675 Pain in left toe(s): Secondary | ICD-10-CM

## 2023-07-07 DIAGNOSIS — B351 Tinea unguium: Secondary | ICD-10-CM | POA: Diagnosis not present

## 2023-07-07 DIAGNOSIS — M21612 Bunion of left foot: Secondary | ICD-10-CM

## 2023-07-07 DIAGNOSIS — M21611 Bunion of right foot: Secondary | ICD-10-CM

## 2023-07-07 NOTE — Progress Notes (Signed)
This patient returns to the office for evaluation and treatment of long thick painful nails .  This patient is unable to trim her own nails since the patient cannot reach her feet.  Patient says the nails are painful walking and wearing his shoes.  She returns for preventive foot care services.  General Appearance  Alert, conversant and in no acute stress.  Vascular  Dorsalis pedis and posterior tibial  pulses are palpable  bilaterally.  Capillary return is within normal limits  bilaterally. Temperature is within normal limits  bilaterally.  Neurologic  Senn-Weinstein monofilament wire test within normal limits  bilaterally. Muscle power within normal limits bilaterally.  Nails Thick disfigured discolored nails with subungual debris  from hallux to fifth toes bilaterally. No evidence of bacterial infection or drainage bilaterally.  Orthopedic  No limitations of motion  feet .  No crepitus or effusions noted.  No bony pathology or digital deformities noted.  HAV  B/L.  Skin  normotropic skin with no porokeratosis noted bilaterally.  No signs of infections or ulcers noted.   Asymptomatic callus sub 5th  metabase right foot.  Onychomycosis  Pain in toes right foot  Pain in toes left foot  Debridement  of nails  1-5  B/L with a nail nipper.  Nails were then filed using a dremel tool with no incidents.    RTC 3 months    Helane Gunther DPM

## 2023-07-14 ENCOUNTER — Encounter: Payer: Self-pay | Admitting: Dermatology

## 2023-07-14 ENCOUNTER — Ambulatory Visit: Payer: Medicare HMO | Admitting: Dermatology

## 2023-07-14 VITALS — BP 135/80 | HR 45

## 2023-07-14 DIAGNOSIS — D229 Melanocytic nevi, unspecified: Secondary | ICD-10-CM

## 2023-07-14 DIAGNOSIS — D1801 Hemangioma of skin and subcutaneous tissue: Secondary | ICD-10-CM | POA: Diagnosis not present

## 2023-07-14 DIAGNOSIS — Z1283 Encounter for screening for malignant neoplasm of skin: Secondary | ICD-10-CM

## 2023-07-14 DIAGNOSIS — Z808 Family history of malignant neoplasm of other organs or systems: Secondary | ICD-10-CM

## 2023-07-14 DIAGNOSIS — L570 Actinic keratosis: Secondary | ICD-10-CM | POA: Diagnosis not present

## 2023-07-14 DIAGNOSIS — W908XXA Exposure to other nonionizing radiation, initial encounter: Secondary | ICD-10-CM

## 2023-07-14 DIAGNOSIS — L814 Other melanin hyperpigmentation: Secondary | ICD-10-CM

## 2023-07-14 DIAGNOSIS — L578 Other skin changes due to chronic exposure to nonionizing radiation: Secondary | ICD-10-CM | POA: Diagnosis not present

## 2023-07-14 DIAGNOSIS — L821 Other seborrheic keratosis: Secondary | ICD-10-CM

## 2023-07-14 NOTE — Patient Instructions (Addendum)

## 2023-07-14 NOTE — Progress Notes (Signed)
   New Patient Visit   Subjective  [name] is a 77 y.o. female who presents for the following: Skin Cancer Screening and Upper Body Skin Exam  Patient present today for new patient visit for UBSE.The patient reports she has spots, moles and lesions to be evaluated, some may be new or changing and the patient may have concern these could be cancer. Patient has not previously been treated by a dermatologist.Patient reports she does not have hx of bx. Patient reports family history of skin cancers (Brother & Daughter). Patient reports throughout her lifetime has had moderate sun exposure. Currently, patient reports if she has excessive sun exposure, she does apply sunscreen and/or wears protective coverings.  The following portions of the chart were reviewed this encounter and updated as appropriate: medications, allergies, medical history  Review of Systems:  No other skin or systemic complaints except as noted in HPI or Assessment and Plan.  Objective  Well appearing patient in no apparent distress; mood and affect are within normal limits.  All skin waist up examined. Relevant physical exam findings are noted in the Assessment and Plan.  Left Malar Cheek, Right Buccal Cheek Erythematous thin papules/macules with gritty scale.     Assessment & Plan  AK (actinic keratosis) (2) Left Malar Cheek; Right Buccal Cheek  Destruction of lesion - Left Malar Cheek, Right Buccal Cheek  Destruction method: cryotherapy   Informed consent: discussed and consent obtained   Timeout:  patient name, date of birth, surgical site, and procedure verified Lesion destroyed using liquid nitrogen: Yes   Cryotherapy cycles:  2 Outcome: patient tolerated procedure well with no complications    Actinic Damage - Chronic condition, secondary to cumulative UV/sun exposure - diffuse scaly erythematous macules with underlying dyspigmentation - Recommend daily broad spectrum sunscreen SPF 30+ to  sun-exposed areas, reapply every 2 hours as needed.  - Staying in the shade or wearing long sleeves, sun glasses (UVA+UVB protection) and wide brim hats (4-inch brim around the entire circumference of the hat) are also recommended for sun protection.  - Call for new or changing lesions.  Lentigines, Seborrheic Keratoses, Hemangiomas - Benign normal skin lesions - Benign-appearing - Call for any changes  Melanocytic Nevi - Tan-brown and/or pink-flesh-colored symmetric macules and papules - Benign appearing on exam today - Observation - Call clinic for new or changing moles - Recommend daily use of broad spectrum spf 30+ sunscreen to sun-exposed areas.   Skin cancer screening performed today.  Return in about 1 year (around 07/13/2024) for TBSE.  Documentation: I have reviewed the above documentation for accuracy and completeness, and I agree with the above.  Stasia Cavalier, am acting as scribe for Langston Reusing, DO.   Langston Reusing, DO

## 2023-07-15 DIAGNOSIS — Z1231 Encounter for screening mammogram for malignant neoplasm of breast: Secondary | ICD-10-CM | POA: Diagnosis not present

## 2023-07-15 LAB — HM MAMMOGRAPHY

## 2023-07-18 ENCOUNTER — Encounter: Payer: Self-pay | Admitting: Family Medicine

## 2023-07-20 ENCOUNTER — Encounter: Payer: Self-pay | Admitting: Family Medicine

## 2023-09-26 ENCOUNTER — Ambulatory Visit (INDEPENDENT_AMBULATORY_CARE_PROVIDER_SITE_OTHER): Payer: Medicare HMO | Admitting: Family Medicine

## 2023-09-26 ENCOUNTER — Encounter: Payer: Self-pay | Admitting: Family Medicine

## 2023-09-26 VITALS — BP 126/62 | Temp 97.3°F | Ht 64.0 in | Wt 171.4 lb

## 2023-09-26 DIAGNOSIS — E611 Iron deficiency: Secondary | ICD-10-CM | POA: Diagnosis not present

## 2023-09-26 DIAGNOSIS — I10 Essential (primary) hypertension: Secondary | ICD-10-CM | POA: Diagnosis not present

## 2023-09-26 DIAGNOSIS — E038 Other specified hypothyroidism: Secondary | ICD-10-CM | POA: Diagnosis not present

## 2023-09-26 LAB — TSH: TSH: 2.36 u[IU]/mL (ref 0.35–5.50)

## 2023-09-26 LAB — CBC
HCT: 36.5 % (ref 36.0–46.0)
Hemoglobin: 12.1 g/dL (ref 12.0–15.0)
MCHC: 33 g/dL (ref 30.0–36.0)
MCV: 95 fL (ref 78.0–100.0)
Platelets: 207 10*3/uL (ref 150.0–400.0)
RBC: 3.84 Mil/uL — ABNORMAL LOW (ref 3.87–5.11)
RDW: 13.8 % (ref 11.5–15.5)
WBC: 4.5 10*3/uL (ref 4.0–10.5)

## 2023-09-26 MED ORDER — LEVOTHYROXINE SODIUM 50 MCG PO TABS
50.0000 ug | ORAL_TABLET | Freq: Every day | ORAL | 2 refills | Status: DC
Start: 2023-09-26 — End: 2024-06-18

## 2023-09-26 NOTE — Progress Notes (Signed)
Established Patient Office Visit   Subjective:  Patient ID: [name], female    DOB: [birth year]  Age: 77 y.o. MRN: [id number]  Chief Complaint  Patient presents with   Medical Management of Chronic Issues    3 month follow up.     HPI Encounter Diagnoses  Name Primary?   Other specified hypothyroidism Yes   Iron deficiency    Benign essential HTN    Follow-up of above.  Continues lisinopril 20 mg for well-controlled hypertension.  Continues levothyroxine 50 mcg daily for hypothyroidism.  Had to discontinue iron sulfate 325 every other day,  Secondary to constipation.  She has tried to increase the iron in her diet.   Review of Systems  Constitutional: Negative.   HENT: Negative.    Eyes:  Negative for blurred vision, discharge and redness.  Respiratory: Negative.    Cardiovascular: Negative.   Gastrointestinal:  Negative for abdominal pain.  Genitourinary: Negative.   Musculoskeletal: Negative.  Negative for myalgias.  Skin:  Negative for rash.  Neurological:  Negative for tingling, loss of consciousness and weakness.  Endo/Heme/Allergies:  Negative for polydipsia.     Current Outpatient Medications:    acetaminophen (TYLENOL) 500 MG tablet, Take 500-1,000 mg by mouth as needed for mild pain or moderate pain., Disp: , Rfl:    albuterol (PROAIR HFA) 108 (90 Base) MCG/ACT inhaler, Inhale 2 puffs into the lungs every 6 (six) hours as needed for wheezing or shortness of breath., Disp: 1 each, Rfl: 0   Cholecalciferol (VITAMIN D) 2000 UNITS CAPS, Take 2,000 Units by mouth daily., Disp: , Rfl:    fexofenadine (ALLEGRA) 60 MG tablet, Take 60 mg by mouth daily., Disp: , Rfl:    fluticasone (FLONASE) 50 MCG/ACT nasal spray, Place 2 sprays into both nostrils daily., Disp: 16 g, Rfl: 6   hyoscyamine (LEVSIN SL) 0.125 MG SL tablet, Place 1 tablet (0.125 mg total) under the tongue 2 (two) times daily as needed (for urgency and loose stools)., Disp: 60 tablet, Rfl: 6    lisinopril (ZESTRIL) 20 MG tablet, TAKE 1 TABLET BY MOUTH AT BEDTIME, Disp: 90 tablet, Rfl: 3   Menthol, Topical Analgesic, (BIOFREEZE EX), Apply topically., Disp: , Rfl:    Multiple Vitamins-Minerals (MULTIVITAMIN WITH MINERALS) tablet, Take 1 tablet by mouth daily., Disp: , Rfl:    nystatin (MYCOSTATIN/NYSTOP) powder, Apply topically 2 (two) times daily as needed. Beneath both breasts, Disp: 15 g, Rfl: 0   OXYQUINOLONE SULFATE VAGINAL (TRIMO-SAN) 0.025 % GEL, Use one applicator full vaginally 2 x a week at hs, Disp: 113.4 g, Rfl: 1   triamcinolone ointment (KENALOG) 0.1 %, Apply once daily to hands as needed for hand eczema only., Disp: 30 g, Rfl: 2   Iron, Ferrous Sulfate, 325 (65 Fe) MG TABS, Take 325 mg by mouth every other day. (Patient not taking: Reported on 09/26/2023), Disp: 45 tablet, Rfl: 2   levothyroxine (SYNTHROID) 50 MCG tablet, Take 1 tablet (50 mcg total) by mouth daily., Disp: 90 tablet, Rfl: 2   Objective:     BP 126/62 (BP Location: Right Arm, Cuff Size: Normal)   Temp (!) 97.3 F (36.3 C)   Ht 5\' 4"  (1.626 m)   Wt 171 lb 6.4 oz (77.7 kg)   BMI 29.42 kg/m    Physical Exam Constitutional:      General: She is not in acute distress.    Appearance: Normal appearance. She is not ill-appearing, toxic-appearing or diaphoretic.  HENT:  Head: Normocephalic and atraumatic.     Right Ear: External ear normal.     Left Ear: External ear normal.  Eyes:     General: No scleral icterus.       Right eye: No discharge.        Left eye: No discharge.     Extraocular Movements: Extraocular movements intact.     Conjunctiva/sclera: Conjunctivae normal.  Cardiovascular:     Rate and Rhythm: Normal rate and regular rhythm.  Pulmonary:     Effort: Pulmonary effort is normal. No respiratory distress.     Breath sounds: No wheezing, rhonchi or rales.  Skin:    General: Skin is warm and dry.  Neurological:     Mental Status: She is alert and oriented to person, place, and  time.  Psychiatric:        Mood and Affect: Mood normal.        Behavior: Behavior normal.      No results found for any visits on 09/26/23.    The 10-year ASCVD risk score (Arnett DK, et al., 2019) is: 29.1%    Assessment & Plan:   Other specified hypothyroidism -     Levothyroxine Sodium; Take 1 tablet (50 mcg total) by mouth daily.  Dispense: 90 tablet; Refill: 2 -     TSH  Iron deficiency -     CBC -     Iron, TIBC and Ferritin Panel  Benign essential HTN    Return in about 3 months (around 12/25/2023).  Information was given about an iron rich diet.  Could consider iron elixir.  Mliss Sax, MD

## 2023-09-27 LAB — IRON,TIBC AND FERRITIN PANEL
%SAT: 21 % (ref 16–45)
Ferritin: 54 ng/mL (ref 16–288)
Iron: 67 ug/dL (ref 45–160)
TIBC: 322 ug/dL (ref 250–450)

## 2023-09-29 ENCOUNTER — Encounter: Payer: Self-pay | Admitting: Podiatry

## 2023-09-29 ENCOUNTER — Encounter: Payer: Self-pay | Admitting: Family Medicine

## 2023-09-29 ENCOUNTER — Telehealth: Payer: Self-pay

## 2023-09-29 ENCOUNTER — Ambulatory Visit: Payer: Medicare HMO | Admitting: Podiatry

## 2023-09-29 DIAGNOSIS — B351 Tinea unguium: Secondary | ICD-10-CM

## 2023-09-29 DIAGNOSIS — M79674 Pain in right toe(s): Secondary | ICD-10-CM

## 2023-09-29 DIAGNOSIS — M79675 Pain in left toe(s): Secondary | ICD-10-CM

## 2023-09-29 NOTE — Telephone Encounter (Signed)
Lab results mailed to pt per her request

## 2023-09-29 NOTE — Progress Notes (Signed)
This patient returns to the office for evaluation and treatment of long thick painful nails .  This patient is unable to trim her own nails since the patient cannot reach her feet.  Patient says the nails are painful walking and wearing his shoes.  She returns for preventive foot care services.  General Appearance  Alert, conversant and in no acute stress.  Vascular  Dorsalis pedis and posterior tibial  pulses are palpable  bilaterally.  Capillary return is within normal limits  bilaterally. Temperature is within normal limits  bilaterally.  Neurologic  Senn-Weinstein monofilament wire test within normal limits  bilaterally. Muscle power within normal limits bilaterally.  Nails Thick disfigured discolored nails with subungual debris  from hallux to fifth toes bilaterally. No evidence of bacterial infection or drainage bilaterally.  Orthopedic  No limitations of motion  feet .  No crepitus or effusions noted.  No bony pathology or digital deformities noted.  HAV  B/L.  Skin  normotropic skin with no porokeratosis noted bilaterally.  No signs of infections or ulcers noted.   Asymptomatic callus sub 5th  metabase right foot.  Onychomycosis  Pain in toes right foot  Pain in toes left foot  Debridement  of nails  1-5  B/L with a nail nipper.  Nails were then filed using a dremel tool with no incidents.    RTC 3 months    Renarda Mullinix DPM  

## 2023-10-12 HISTORY — PX: CATARACT EXTRACTION: SUR2

## 2023-11-10 ENCOUNTER — Ambulatory Visit (INDEPENDENT_AMBULATORY_CARE_PROVIDER_SITE_OTHER): Payer: Medicare HMO | Admitting: Pulmonary Disease

## 2023-11-10 ENCOUNTER — Encounter (HOSPITAL_BASED_OUTPATIENT_CLINIC_OR_DEPARTMENT_OTHER): Payer: Self-pay | Admitting: Pulmonary Disease

## 2023-11-10 VITALS — BP 142/74 | HR 72 | Ht 66.0 in | Wt 177.5 lb

## 2023-11-10 DIAGNOSIS — G4733 Obstructive sleep apnea (adult) (pediatric): Secondary | ICD-10-CM

## 2023-11-10 DIAGNOSIS — I10 Essential (primary) hypertension: Secondary | ICD-10-CM | POA: Diagnosis not present

## 2023-11-10 NOTE — Progress Notes (Signed)
   Subjective:    Patient ID: [name], female    DOB: 1946/04/22, 78 y.o.   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  After increasing pressure to this level she has had a remarkable decrease in events.  She feels well rested she is compliant and uses an AirFit F30 fullface mask.  She complains of drying of her nose in spite of using distilled water in her humidifier. Pressures okay.  She denies sleep pressure in the afternoon and feels rested when she wakes up  Significant tests/ events reviewed PSG 9/ 2012>>TST 286 minutes with decreased slow wave sleep and very little REM noted. Sleep onset latency was normal at 6.5 minutes and REM onset was very prolonged at 345 minutes. Sleep efficiency was poor at 70%. 50 apneas and 27 obstructive hypopneas, AHI of 16 events per hour. The events occurred in all body positions and there was moderate snoring noted throughout - low desatn of 88%  CPAP titration 10/ 2023 showed optimal control on CPAP 14 cm   Review of Systems neg for any significant sore throat, dysphagia, itching, sneezing, nasal congestion or excess/ purulent secretions, fever, chills, sweats, unintended wt loss, pleuritic or exertional cp, hempoptysis, orthopnea pnd or change in chronic leg swelling. Also denies presyncope, palpitations, heartburn, abdominal pain, nausea, vomiting, diarrhea or change in bowel or urinary habits, dysuria,hematuria, rash, arthralgias, visual complaints, headache, numbness weakness or ataxia.     Objective:   Physical Exam  Gen. Pleasant, obese, in no distress ENT - no lesions, no post nasal drip Neck: No JVD, no thyromegaly, no  carotid bruits Lungs: no use of accessory muscles, no dullness to percussion, decreased without rales or rhonchi  Cardiovascular: Rhythm regular, heart sounds  normal, no murmurs or gallops, no peripheral edema Musculoskeletal: No deformities, no cyanosis or clubbing , no tremors       Assessment & Plan:    OSA on CPAP -CPAP download was reviewed which shows excellent control of events on 14 cm with no leak and good compliance more than 7 hours every night without a single missed night.  CPAP certainly helped improve her somnolence and fatigue.  Central apneas have resolved at the higher pressure.  Weight loss encouraged, compliance with goal of at least 4-6 hrs every night is the expectation. Advised against medications with sedative side effects Cautioned against driving when sleepy - understanding that sleepiness will vary on a day to day basis  Hypertension -maintained on lisinopril, blood pressure slight high today but asymptomatic

## 2023-11-10 NOTE — Addendum Note (Signed)
Addended byClyda Greener M on: 11/10/2023 05:02 PM   Modules accepted: Orders

## 2023-11-10 NOTE — Patient Instructions (Signed)
CPAP is working well on 14 cm. CPAP supplies will be renewed for a year

## 2023-11-16 ENCOUNTER — Ambulatory Visit
Admission: EM | Admit: 2023-11-16 | Discharge: 2023-11-16 | Disposition: A | Discharge status: Home / Self Care | Payer: Medicare HMO | Attending: Internal Medicine | Admitting: Internal Medicine

## 2023-11-16 DIAGNOSIS — H1032 Unspecified acute conjunctivitis, left eye: Secondary | ICD-10-CM

## 2023-11-16 MED ORDER — POLYMYXIN B-TRIMETHOPRIM 10000-0.1 UNIT/ML-% OP SOLN: 1.0000 [drp] | OPHTHALMIC | 0 refills | Status: AC

## 2023-11-16 NOTE — ED Triage Notes (Signed)
"  I think I have pink eye in my left eye". "My eyes are normally dry but yesterday I was driving taking grandson to provider appt my eye started watering, then itching, no injury". "I have noticed eye lid swelling and redness as well". No fever.

## 2023-11-16 NOTE — ED Provider Notes (Signed)
 [facility]    CSN: [id number] Arrival date & time: 11/16/23  1651      History   Chief Complaint Chief Complaint  Patient presents with   Eye Problem    HPI [name] is a 78 y.o. female.   Patient here today for evaluation of left eye discomfort that started yesterday.  She reports that she looked towards the sun while driving and noted pain with watering of her eye.  She reports she continued to have eye watering and then started to have itching last night.  She notes that she had some discharge when she was wiping her eye with crusting to her lashes as well.  She states today symptoms have improved somewhat.  She does not report any other injury.  She has not any headache, nausea or vomiting.  The history is provided by the patient.  Eye Problem Associated symptoms: discharge, itching and redness   Associated symptoms: no nausea and no vomiting     Past Medical History:  Diagnosis Date   Allergy    Arthritis    Asthma    mild   Blood transfusion without reported diagnosis    H/O bronchitis    Hemorrhoid    Hx of blood clots 1967   from birth control in LUE   Hypertension    Hypoglycemia    Hypothyroidism    Migraine    from food   Osteopenia 07/04/2019   Pneumonia    hx of   Seasonal allergies    Sleep apnea    CPAP   UTI (urinary tract infection)     Patient Active Problem List   Diagnosis Date Noted   Atypical nevi 03/30/2023   Elevated cholesterol 03/30/2023   Prediabetes 03/30/2023   Iron  deficiency 02/28/2023   Bilateral lower extremity edema 02/18/2023   Need for diphtheria-tetanus-pertussis (Tdap) vaccine 03/23/2022   Normocytic anemia 09/21/2021   B12 deficiency 09/21/2021   Uterine prolapse 12/09/2020   Urge incontinence of urine 12/09/2020   Left shoulder pain 12/09/2020   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   Pain due to onychomycosis of toenails of both feet 04/22/2020   History of hypoglycemia 12/25/2019    Onychomycosis 12/25/2019   Abnormal serum cholesterol 12/25/2019   Osteopenia 07/04/2019   Obesity (BMI 30-39.9) 12/22/2017   Vitamin D  deficiency 05/18/2016   Constipation 09/25/2013   Acute blood loss anemia 09/20/2013   Osteoarthritis of right hip 09/19/2013   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   Heel pain 02/16/2013   Knee pain 07/05/2012   Bilateral bunions 07/05/2012   Screening for malignant neoplasm of cervix 07/05/2012   Screening for diabetes mellitus (DM) 07/05/2012   Sinusitis 02/18/2012   Anxiety and depression 11/02/2011   OSA on CPAP 11/02/2011   Asthma 08/12/2010   PANIC DISORDER 05/07/2009   Benign essential HTN 12/05/2008   Hypothyroidism 03/27/2008   Arthropathy 03/27/2008   Allergic rhinitis 08/26/2007    Past Surgical History:  Procedure Laterality Date   CATARACT EXTRACTION     rt eye   COLONOSCOPY  05/13/2008   [facility]   detatched retina  2003   rt eye   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   Procedure: RIGHT TOTAL HIP ARTHROPLASTY;  Surgeon: Tanda DELENA Heading, MD;  Location: [facility];  Service: Orthopedics;  Laterality: Right;    OB History     Gravida  3   Para  3   Term  3   Preterm  AB      Living  3      SAB      IAB      Ectopic      Multiple      Live Births  3            Home Medications    Prior to Admission medications   Medication Sig Start Date End Date Taking? Authorizing Provider  trimethoprim -polymyxin b  (POLYTRIM ) ophthalmic solution Place 1 drop into the left eye every 4 (four) hours for 7 days. 11/16/23 11/23/23 Yes Billy Asberry FALCON, PA-C  acetaminophen  (TYLENOL ) 500 MG tablet Take 500-1,000 mg by mouth as needed for mild pain or moderate pain.    [provider]  albuterol  (PROAIR  HFA) 108 (90 Base) MCG/ACT inhaler Inhale 2 puffs into the lungs every 6 (six) hours as needed for wheezing or shortness of breath. 09/21/21   Berneta Elsie Sayre, MD  Cholecalciferol (VITAMIN D ) 2000  UNITS CAPS Take 2,000 Units by mouth daily.    [provider]  fexofenadine (ALLEGRA) 60 MG tablet Take 60 mg by mouth daily.    [provider]  fluticasone  (FLONASE ) 50 MCG/ACT nasal spray Place 2 sprays into both nostrils daily. 12/29/21   Thedora Garnette HERO, MD  hyoscyamine  (LEVSIN  SL) 0.125 MG SL tablet Place 1 tablet (0.125 mg total) under the tongue 2 (two) times daily as needed (for urgency and loose stools). 11/03/22   Esterwood, Amy S, PA-C  Iron , Ferrous Sulfate , 325 (65 Fe) MG TABS Take 325 mg by mouth every other day. 02/28/23   Berneta Elsie Sayre, MD  levothyroxine  (SYNTHROID ) 50 MCG tablet Take 1 tablet (50 mcg total) by mouth daily. 09/26/23   Berneta Elsie Sayre, MD  lisinopril  (ZESTRIL ) 20 MG tablet TAKE 1 TABLET BY MOUTH AT BEDTIME 06/24/23   Berneta Elsie Sayre, MD  Menthol , Topical Analgesic, (BIOFREEZE EX) Apply topically.    [provider]  Multiple Vitamins-Minerals (MULTIVITAMIN WITH MINERALS) tablet Take 1 tablet by mouth daily.    [provider]  nystatin  (MYCOSTATIN /NYSTOP ) powder Apply topically 2 (two) times daily as needed. Beneath both breasts 12/06/18   Daryl Setter, NP  OXYQUINOLONE SULFATE VAGINAL (TRIMO-SAN) 0.025 % GEL Use one applicator full vaginally 2 x a week at hs 05/14/21   Jertson, Jill Evelyn, MD  triamcinolone  ointment (KENALOG ) 0.1 % Apply once daily to hands as needed for hand eczema only. 12/28/19   Berneta Elsie Sayre, MD    Family History Family History  Problem Relation Age of Onset   Thyroid  disease Mother    Osteoarthritis Mother    Thyroid  disease Father    Osteoarthritis Father    Osteoarthritis Maternal Grandmother    Colon cancer Maternal Grandmother 76   Osteoarthritis Maternal Grandfather    Osteoarthritis Paternal Grandmother    Esophageal cancer Neg Hx    Rectal cancer Neg Hx    Stomach cancer Neg Hx    Colon polyps Neg Hx     Social History Social History   Tobacco Use    Smoking status: Former    Current packs/day: 0.00    Average packs/day: 3.0 packs/day for 13.0 years (39.0 ttl pk-yrs)    Types: Cigarettes    Start date: 10/11/1961    Quit date: 10/11/1974    Years since quitting: 49.1    Passive exposure: Never   Smokeless tobacco: Never  Vaping Use   Vaping status: Never Used  Substance Use Topics   Alcohol use: No  Alcohol/week: 0.0 standard drinks of alcohol   Drug use: No     Allergies   Codeine, Pork-derived products, Hydrocodone, and Prednisone    Review of Systems Review of Systems  Constitutional:  Negative for chills and fever.  Eyes:  Positive for discharge, redness and itching.  Respiratory:  Negative for shortness of breath.   Gastrointestinal:  Negative for abdominal pain, nausea and vomiting.     Physical Exam Triage Vital Signs ED Triage Vitals  Encounter Vitals Group     BP 11/16/23 1714 (!) 148/77     Systolic BP Percentile --      Diastolic BP Percentile --      Pulse Rate 11/16/23 1714 64     Resp 11/16/23 1714 18     Temp 11/16/23 1714 98.3 F (36.8 C)     Temp Source 11/16/23 1714 Oral     SpO2 11/16/23 1714 99 %     Weight 11/16/23 1712 177 lb 7.5 oz (80.5 kg)     Height 11/16/23 1712 5' 6 (1.676 m)     Head Circumference --      Peak Flow --      Pain Score 11/16/23 1712 0     Pain Loc --      Pain Education --      Exclude from Growth Chart --    No data found.  Updated Vital Signs BP (!) 148/77 (BP Location: Left Arm)   Pulse 64   Temp 98.3 F (36.8 C) (Oral)   Resp 18   Ht 5' 6 (1.676 m)   Wt 177 lb 7.5 oz (80.5 kg)   SpO2 99%   BMI 28.64 kg/m   Visual Acuity Right Eye Distance: Not obtained. (Uncorrected.) Left Eye Distance: 20/200 (Uncorrected) Bilateral Distance: Not Obgained (Uncorrected)  Right Eye Near:   Left Eye Near:    Bilateral Near:     Physical Exam Vitals and nursing note reviewed.  Constitutional:      General: She is not in acute distress.    Appearance: Normal  appearance. She is not ill-appearing.  HENT:     Head: Normocephalic and atraumatic.  Eyes:     Extraocular Movements: Extraocular movements intact.     Pupils: Pupils are equal, round, and reactive to light.     Comments: Mild conjunctival injection to the left  Cardiovascular:     Rate and Rhythm: Normal rate.  Pulmonary:     Effort: Pulmonary effort is normal.  Neurological:     Mental Status: She is alert.  Psychiatric:        Mood and Affect: Mood normal.        Behavior: Behavior normal.        Thought Content: Thought content normal.      UC Treatments / Results  Labs (all labs ordered are listed, but only abnormal results are displayed) Labs Reviewed - No data to display  EKG   Radiology No results found.  Procedures Procedures (including critical care time)  Medications Ordered in UC Medications - No data to display  Initial Impression / Assessment and Plan / UC Course  I have reviewed the triage vital signs and the nursing notes.  Pertinent labs & imaging results that were available during my care of the patient were reviewed by me and considered in my medical decision making (see chart for details).    Polytrim  drops prescribed to cover possible conjunctivitis.  Advised follow-up if symptoms do not continue to improve  and further evaluation in the emergency room with any worsening.  Final Clinical Impressions(s) / UC Diagnoses   Final diagnoses:  Acute conjunctivitis of left eye, unspecified acute conjunctivitis type   Discharge Instructions   None    ED Prescriptions     Medication Sig Dispense Auth. Provider   trimethoprim -polymyxin b  (POLYTRIM ) ophthalmic solution Place 1 drop into the left eye every 4 (four) hours for 7 days. 10 mL Billy Asberry FALCON, PA-C      PDMP not reviewed this encounter.   Billy Asberry FALCON, PA-C 11/16/23 1757

## 2023-11-29 DIAGNOSIS — G4733 Obstructive sleep apnea (adult) (pediatric): Secondary | ICD-10-CM | POA: Diagnosis not present

## 2023-12-26 ENCOUNTER — Encounter: Payer: Self-pay | Admitting: Family Medicine

## 2023-12-26 ENCOUNTER — Ambulatory Visit (INDEPENDENT_AMBULATORY_CARE_PROVIDER_SITE_OTHER): Payer: Medicare HMO | Admitting: Family Medicine

## 2023-12-26 VITALS — BP 122/62 | Temp 97.2°F | Ht 66.0 in | Wt 173.4 lb

## 2023-12-26 DIAGNOSIS — E039 Hypothyroidism, unspecified: Secondary | ICD-10-CM

## 2023-12-26 DIAGNOSIS — R7303 Prediabetes: Secondary | ICD-10-CM | POA: Diagnosis not present

## 2023-12-26 DIAGNOSIS — I10 Essential (primary) hypertension: Secondary | ICD-10-CM

## 2023-12-26 DIAGNOSIS — D649 Anemia, unspecified: Secondary | ICD-10-CM | POA: Diagnosis not present

## 2023-12-26 DIAGNOSIS — E611 Iron deficiency: Secondary | ICD-10-CM

## 2023-12-26 LAB — BASIC METABOLIC PANEL
BUN: 19 mg/dL (ref 6–23)
CO2: 28 meq/L (ref 19–32)
Calcium: 9.6 mg/dL (ref 8.4–10.5)
Chloride: 105 meq/L (ref 96–112)
Creatinine, Ser: 0.86 mg/dL (ref 0.40–1.20)
GFR: 64.54 mL/min (ref >60.00)
Glucose, Bld: 85 mg/dL (ref 70–99)
Potassium: 4.2 meq/L (ref 3.5–5.1)
Sodium: 141 meq/L (ref 135–145)

## 2023-12-26 LAB — CBC
HCT: 37.7 % (ref 36.0–46.0)
Hemoglobin: 12.5 g/dL (ref 12.0–15.0)
MCHC: 33.2 g/dL (ref 30.0–36.0)
MCV: 95.1 fl (ref 78.0–100.0)
Platelets: 209 10*3/uL (ref 150.0–400.0)
RBC: 3.97 Mil/uL (ref 3.87–5.11)
RDW: 13.7 % (ref 11.5–15.5)
WBC: 4.5 10*3/uL (ref 4.0–10.5)

## 2023-12-26 LAB — TSH: TSH: 3.09 u[IU]/mL (ref 0.35–5.50)

## 2023-12-26 LAB — VITAMIN B12: Vitamin B-12: 504 pg/mL (ref 211–911)

## 2023-12-26 LAB — HEMOGLOBIN A1C: Hgb A1c MFr Bld: 5.8 % (ref 4.6–6.5)

## 2023-12-26 NOTE — Progress Notes (Signed)
 Established Patient Office Visit   Subjective:  Patient ID: [name], female    DOB: [birth year]  Age: 78 y.o. MRN: [id number]  Chief Complaint  Patient presents with   Medical Management of Chronic Issues    3 month follow up. Pt is fasting. Pt has experienced Dry eyes, irritations and crusting. Pt went to Urgent care to get eye drops.     HPI Encounter Diagnoses  Name Primary?   Prediabetes Yes   Benign essential HTN    Normocytic anemia    Hypothyroidism, unspecified type    For follow-up of above.  [place]  Daughter lives about 10 miles away.  Discontinued iron therapy but is increase the iron in her diet.  History of dry eyes and is due for follow-up with her eye doctor.  Continue Zestril 20 for hypertension and levothyroxine 50 mcg for hypothyroidism   Review of Systems  Constitutional: Negative.   HENT: Negative.    Eyes:  Negative for blurred vision, discharge and redness.  Respiratory: Negative.    Cardiovascular: Negative.   Gastrointestinal:  Negative for abdominal pain and blood in stool.  Genitourinary: Negative.  Negative for hematuria.  Musculoskeletal: Negative.  Negative for myalgias.  Skin:  Negative for rash.  Neurological:  Negative for tingling, loss of consciousness and weakness.  Endo/Heme/Allergies:  Negative for polydipsia.     Current Outpatient Medications:    acetaminophen (TYLENOL) 500 MG tablet, Take 500-1,000 mg by mouth as needed for mild pain or moderate pain., Disp: , Rfl:    albuterol (PROAIR HFA) 108 (90 Base) MCG/ACT inhaler, Inhale 2 puffs into the lungs every 6 (six) hours as needed for wheezing or shortness of breath., Disp: 1 each, Rfl: 0   Cholecalciferol (VITAMIN D) 2000 UNITS CAPS, Take 2,000 Units by mouth daily., Disp: , Rfl:    fexofenadine (ALLEGRA) 60 MG tablet, Take 60 mg by mouth daily., Disp: , Rfl:    fluticasone (FLONASE) 50 MCG/ACT nasal spray, Place 2 sprays into both nostrils daily.,  Disp: 16 g, Rfl: 6   hyoscyamine (LEVSIN SL) 0.125 MG SL tablet, Place 1 tablet (0.125 mg total) under the tongue 2 (two) times daily as needed (for urgency and loose stools)., Disp: 60 tablet, Rfl: 6   Iron, Ferrous Sulfate, 325 (65 Fe) MG TABS, Take 325 mg by mouth every other day., Disp: 45 tablet, Rfl: 2   levothyroxine (SYNTHROID) 50 MCG tablet, Take 1 tablet (50 mcg total) by mouth daily., Disp: 90 tablet, Rfl: 2   lisinopril (ZESTRIL) 20 MG tablet, TAKE 1 TABLET BY MOUTH AT BEDTIME, Disp: 90 tablet, Rfl: 3   Menthol, Topical Analgesic, (BIOFREEZE EX), Apply topically., Disp: , Rfl:    Multiple Vitamins-Minerals (MULTIVITAMIN WITH MINERALS) tablet, Take 1 tablet by mouth daily., Disp: , Rfl:    nystatin (MYCOSTATIN/NYSTOP) powder, Apply topically 2 (two) times daily as needed. Beneath both breasts, Disp: 15 g, Rfl: 0   OXYQUINOLONE SULFATE VAGINAL (TRIMO-SAN) 0.025 % GEL, Use one applicator full vaginally 2 x a week at hs, Disp: 113.4 g, Rfl: 1   triamcinolone ointment (KENALOG) 0.1 %, Apply once daily to hands as needed for hand eczema only., Disp: 30 g, Rfl: 2   Objective:     BP 122/62   Temp (!) 97.2 F (36.2 C)   Ht 5\' 6"  (1.676 m)   Wt 173 lb 6.4 oz (78.7 kg)   BMI 27.99 kg/m  BP Readings from Last 3 Encounters:  12/26/23 122/62  11/16/23 (!) 148/77  11/10/23 (!) 142/74   Wt Readings from Last 3 Encounters:  12/26/23 173 lb 6.4 oz (78.7 kg)  11/16/23 177 lb 7.5 oz (80.5 kg)  11/10/23 177 lb 8 oz (80.5 kg)      Physical Exam Constitutional:      General: She is not in acute distress.    Appearance: Normal appearance. She is not ill-appearing, toxic-appearing or diaphoretic.  HENT:     Head: Normocephalic and atraumatic.     Right Ear: External ear normal.     Left Ear: External ear normal.     Mouth/Throat:     Mouth: Mucous membranes are moist.     Pharynx: Oropharynx is clear. No oropharyngeal exudate or posterior oropharyngeal erythema.  Eyes:     General:  No scleral icterus.       Right eye: No discharge.        Left eye: No discharge.     Extraocular Movements: Extraocular movements intact.     Conjunctiva/sclera: Conjunctivae normal.     Pupils: Pupils are equal, round, and reactive to light.  Cardiovascular:     Rate and Rhythm: Normal rate and regular rhythm.  Pulmonary:     Effort: Pulmonary effort is normal. No respiratory distress.     Breath sounds: Normal breath sounds. No wheezing or rales.  Abdominal:     General: Bowel sounds are normal.  Musculoskeletal:     Cervical back: No rigidity or tenderness.  Lymphadenopathy:     Cervical: No cervical adenopathy.  Skin:    General: Skin is warm and dry.  Neurological:     Mental Status: She is alert and oriented to person, place, and time.  Psychiatric:        Mood and Affect: Mood normal.        Behavior: Behavior normal.      No results found for any visits on 12/26/23.    The 10-year ASCVD risk score (Arnett DK, et al., 2019) is: 27.5%    Assessment & Plan:   Prediabetes -     Hemoglobin A1c -     Basic metabolic panel  Benign essential HTN -     Basic metabolic panel  Normocytic anemia -     Iron, TIBC and Ferritin Panel -     CBC -     Vitamin B12  Hypothyroidism, unspecified type -     TSH    Return in about 6 months (around 06/27/2024).  Follow-up with eye doctor for dry eyes.  No evidence for conjunctivitis today.  Believes that she may be able to restart the iron if needed if she takes it with a stool softener.  Mliss Sax, MD

## 2023-12-27 LAB — IRON,TIBC AND FERRITIN PANEL
%SAT: 19 % (ref 16–45)
Ferritin: 33 ng/mL (ref 16–288)
Iron: 66 ug/dL (ref 45–160)
TIBC: 343 ug/dL (ref 250–450)

## 2023-12-27 MED ORDER — IRON (FERROUS SULFATE) 325 (65 FE) MG PO TABS: 325.0000 mg | ORAL_TABLET | ORAL | 2 refills | Status: DC

## 2023-12-27 NOTE — Addendum Note (Signed)
 Addended by: Andrez Grime on: 12/27/2023 08:48 AM   Modules accepted: Orders

## 2024-01-04 ENCOUNTER — Ambulatory Visit: Payer: Medicare HMO | Admitting: Podiatry

## 2024-01-04 ENCOUNTER — Encounter: Payer: Self-pay | Admitting: Podiatry

## 2024-01-04 DIAGNOSIS — B351 Tinea unguium: Secondary | ICD-10-CM | POA: Diagnosis not present

## 2024-01-04 DIAGNOSIS — M79674 Pain in right toe(s): Secondary | ICD-10-CM | POA: Diagnosis not present

## 2024-01-04 DIAGNOSIS — M79675 Pain in left toe(s): Secondary | ICD-10-CM | POA: Diagnosis not present

## 2024-01-04 NOTE — Progress Notes (Signed)
This patient returns to the office for evaluation and treatment of long thick painful nails .  This patient is unable to trim her own nails since the patient cannot reach her feet.  Patient says the nails are painful walking and wearing his shoes.  She returns for preventive foot care services.  General Appearance  Alert, conversant and in no acute stress.  Vascular  Dorsalis pedis and posterior tibial  pulses are palpable  bilaterally.  Capillary return is within normal limits  bilaterally. Temperature is within normal limits  bilaterally.  Neurologic  Senn-Weinstein monofilament wire test within normal limits  bilaterally. Muscle power within normal limits bilaterally.  Nails Thick disfigured discolored nails with subungual debris  from hallux to fifth toes bilaterally. No evidence of bacterial infection or drainage bilaterally.  Orthopedic  No limitations of motion  feet .  No crepitus or effusions noted.  No bony pathology or digital deformities noted.  HAV  B/L.  Skin  normotropic skin with no porokeratosis noted bilaterally.  No signs of infections or ulcers noted.   Asymptomatic callus sub 5th  metabase right foot.  Onychomycosis  Pain in toes right foot  Pain in toes left foot  Debridement  of nails  1-5  B/L with a nail nipper.  Nails were then filed using a dremel tool with no incidents.    RTC 3 months    Renarda Mullinix DPM  

## 2024-01-30 ENCOUNTER — Ambulatory Visit (INDEPENDENT_AMBULATORY_CARE_PROVIDER_SITE_OTHER): Payer: Medicare HMO

## 2024-01-30 DIAGNOSIS — Z0000 Encounter for general adult medical examination without abnormal findings: Secondary | ICD-10-CM | POA: Diagnosis not present

## 2024-01-30 NOTE — Progress Notes (Signed)
 Subjective:   [name] is a 78 y.o. who presents for a Medicare Wellness preventive visit.  Visit Complete: Virtual I connected with  [name] on 01/30/24 by a audio enabled telemedicine application and verified that I am speaking with the correct person using two identifiers.  Patient Location: Home  Provider Location: Office/Clinic  I discussed the limitations of evaluation and management by telemedicine. The patient expressed understanding and agreed to proceed.  Vital Signs: Because this visit was a virtual/telehealth visit, some criteria may be missing or patient reported. Any vitals not documented were not able to be obtained and vitals that have been documented are patient reported.  VideoError- Librarian, academic were attempted between this provider and patient, however failed, due to patient having technical difficulties OR patient did not have access to video capability.  We continued and completed visit with audio only.   Persons Participating in Visit: Patient.  AWV Questionnaire: No: Patient Medicare AWV questionnaire was not completed prior to this visit.  Cardiac Risk Factors include: advanced age (>33men, >25 women);hypertension     Objective:    Today's Vitals   01/30/24 1456  PainSc: 1    There is no height or weight on file to calculate BMI.     01/30/2024    3:14 PM 01/28/2023    2:43 PM 08/01/2022   10:27 PM 03/02/2022    9:02 AM 01/27/2021   10:24 AM 12/09/2020   12:58 PM 11/16/2019    9:38 AM  Advanced Directives  Does Patient Have a Medical Advance Directive? No No No No No No No  Would patient like information on creating a medical advance directive? No - Patient declined  Yes (MAU/Ambulatory/Procedural Areas - Information given) No - Patient declined No - Patient declined Yes (MAU/Ambulatory/Procedural Areas - Information given) No - Patient declined    Current Medications (verified) Outpatient Encounter  Medications as of 01/30/2024  Medication Sig   acetaminophen  (TYLENOL ) 500 MG tablet Take 500-1,000 mg by mouth as needed for mild pain or moderate pain.   albuterol  (PROAIR  HFA) 108 (90 Base) MCG/ACT inhaler Inhale 2 puffs into the lungs every 6 (six) hours as needed for wheezing or shortness of breath.   Cholecalciferol (VITAMIN D ) 2000 UNITS CAPS Take 2,000 Units by mouth daily.   fexofenadine (ALLEGRA) 60 MG tablet Take 60 mg by mouth daily.   fluticasone  (FLONASE ) 50 MCG/ACT nasal spray Place 2 sprays into both nostrils daily.   Iron , Ferrous Sulfate , 325 (65 Fe) MG TABS Take 325 mg by mouth every other day.   levothyroxine  (SYNTHROID ) 50 MCG tablet Take 1 tablet (50 mcg total) by mouth daily.   lisinopril  (ZESTRIL ) 20 MG tablet TAKE 1 TABLET BY MOUTH AT BEDTIME   Menthol , Topical Analgesic, (BIOFREEZE EX) Apply topically.   Multiple Vitamins-Minerals (MULTIVITAMIN WITH MINERALS) tablet Take 1 tablet by mouth daily.   nystatin  (MYCOSTATIN /NYSTOP ) powder Apply topically 2 (two) times daily as needed. Beneath both breasts   triamcinolone  ointment (KENALOG ) 0.1 % Apply once daily to hands as needed for hand eczema only.   hyoscyamine  (LEVSIN SL) 0.125 MG SL tablet Place 1 tablet (0.125 mg total) under the tongue 2 (two) times daily as needed (for urgency and loose stools). (Patient not taking: Reported on 01/30/2024)   OXYQUINOLONE SULFATE VAGINAL (TRIMO-SAN) 0.025 % GEL Use one applicator full vaginally 2 x a week at hs (Patient not taking: Reported on 01/30/2024)   No facility-administered encounter medications on file as  of 01/30/2024.    Allergies (verified) Codeine, Pork-derived products, Hydrocodone, and Prednisone    History: Past Medical History:  Diagnosis Date   Allergy    Arthritis    Asthma    mild   Blood transfusion without reported diagnosis    H/O bronchitis    Hemorrhoid    Hx of blood clots 1967   from birth control in LUE   Hypertension    Hypoglycemia     Hypothyroidism    Migraine    from food   Osteopenia 07/04/2019   Pneumonia    hx of   Seasonal allergies    Sleep apnea    CPAP   UTI (urinary tract infection)    Past Surgical History:  Procedure Laterality Date   CATARACT EXTRACTION     rt eye   COLONOSCOPY  05/13/2008   Howard Macho   detatched retina  2003   rt eye   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   Procedure: RIGHT TOTAL HIP ARTHROPLASTY;  Surgeon: Florencia Hunter, MD;  Location: [facility];  Service: Orthopedics;  Laterality: Right;   Family History  Problem Relation Age of Onset   Thyroid  disease Mother    Osteoarthritis Mother    Thyroid  disease Father    Osteoarthritis Father    Osteoarthritis Maternal Grandmother    Colon cancer Maternal Grandmother 2   Osteoarthritis Maternal Grandfather    Osteoarthritis Paternal Grandmother    Esophageal cancer Neg Hx    Rectal cancer Neg Hx    Stomach cancer Neg Hx    Colon polyps Neg Hx    Social History   Socioeconomic History   Marital status: Legally Separated    Spouse name: Not on file   Number of children: Not on file   Years of education: Not on file   Highest education level: Not on file  Occupational History   Not on file  Tobacco Use   Smoking status: Former    Current packs/day: 0.00    Average packs/day: 3.0 packs/day for 13.0 years (39.0 ttl pk-yrs)    Types: Cigarettes    Start date: 10/11/1961    Quit date: 10/11/1974    Years since quitting: 49.3    Passive exposure: Never   Smokeless tobacco: Never  Vaping Use   Vaping status: Never Used  Substance and Sexual Activity   Alcohol use: No    Alcohol/week: 0.0 standard drinks of alcohol   Drug use: No   Sexual activity: Not Currently  Other Topics Concern   Not on file  Social History Narrative   Not on file   Social Drivers of Health   Financial Resource Strain: Low Risk  (01/30/2024)   Overall Financial Resource Strain (CARDIA)    Difficulty of Paying Living Expenses: Not hard at all   Food Insecurity: No Food Insecurity (01/30/2024)   Hunger Vital Sign    Worried About Running Out of Food in the Last Year: Never true    Ran Out of Food in the Last Year: Never true  Transportation Needs: No Transportation Needs (01/30/2024)   PRAPARE - Administrator, Civil Service (Medical): No    Lack of Transportation (Non-Medical): No  Physical Activity: Inactive (01/30/2024)   Exercise Vital Sign    Days of Exercise per Week: 0 days    Minutes of Exercise per Session: 0 min  Stress: No Stress Concern Present (01/30/2024)   [employer] of Occupational Health - Occupational Stress Questionnaire  Feeling of Stress : Not at all  Social Connections: Socially Isolated (01/30/2024)   Social Connection and Isolation Panel [NHANES]    Frequency of Communication with Friends and Family: More than three times a week    Frequency of Social Gatherings with Friends and Family: Once a week    Attends Religious Services: Never    Database administrator or Organizations: No    Attends Engineer, structural: Never    Marital Status: Separated    Tobacco Counseling Counseling given: Not Answered    Clinical Intake:  Pre-visit preparation completed: Yes  Pain : 0-10 Pain Score: 1  Pain Type: Chronic pain Pain Location: Generalized Pain Descriptors / Indicators: Aching Pain Onset: More than a month ago Pain Frequency: Constant     Nutritional Risks: None Diabetes: No  Lab Results  Component Value Date   HGBA1C 5.8 12/26/2023   HGBA1C 5.7 06/30/2023   HGBA1C 5.7 03/30/2023     How often do you need to have someone help you when you read instructions, pamphlets, or other written materials from your Briggs or pharmacy?: 1 - Never  Interpreter Needed?: No  Information entered by :: NAllen LPN   Activities of Daily Living     01/30/2024    2:59 PM  In your present state of health, do you have any difficulty performing the following activities:   Hearing? 0  Vision? 0  Difficulty concentrating or making decisions? 0  Walking or climbing stairs? 1  Dressing or bathing? 0  Doing errands, shopping? 0  Preparing Food and eating ? N  Using the Toilet? N  In the past six months, have you accidently leaked urine? Y  Comment will make appointment with Dr. Tilmon Font  Do you have problems with loss of bowel control? N  Managing your Medications? N  Managing your Finances? N  Housekeeping or managing your Housekeeping? N    Patient Care Team: Tonna Frederic, MD as PCP - General (Family Medicine) Hazle Lites, MD as Consulting Physician (Orthopedic Surgery) Lind Repine, MD as Consulting Physician (Pulmonary Disease)  Indicate any recent Medical Services you may have received from other than [facility] providers in the past year (date may be approximate).     Assessment:   This is a routine wellness examination for Detta Mellin.  Hearing/Vision screen Hearing Screening - Comments:: Denies hearing issues Vision Screening - Comments:: No regular eye exams   Goals Addressed             This Visit's Progress    Patient Stated       01/30/2024, wants to lose weight       Depression Screen     01/30/2024    3:18 PM 03/30/2023    9:49 AM 03/10/2023    2:17 PM 02/24/2023    3:39 PM 02/18/2023    4:16 PM 01/28/2023    2:44 PM 09/22/2022   10:35 AM  PHQ 2/9 Scores  PHQ - 2 Score 0 0 0 0 0 0 0  PHQ- 9 Score 1          Fall Risk     01/30/2024    3:15 PM 03/30/2023    9:49 AM 03/10/2023    2:17 PM 02/24/2023    3:39 PM 02/18/2023    4:16 PM  Fall Risk   Falls in the past year? 1 0 0 0 0  Comment tripped      Number falls in past yr: 0  0 0 0 0  Injury with Fall? 0 0 0 0 0  Risk for fall due to : Impaired mobility;Impaired balance/gait;Medication side effect No Fall Risks No Fall Risks No Fall Risks No Fall Risks  Follow up Falls prevention discussed;Falls evaluation completed Falls evaluation completed Falls evaluation  completed Falls evaluation completed Falls evaluation completed    MEDICARE RISK AT HOME:  Medicare Risk at Home Any stairs in or around the home?: Yes If so, are there any without handrails?: No Home free of loose throw rugs in walkways, pet beds, electrical cords, etc?: Yes Adequate lighting in your home to reduce risk of falls?: Yes Life alert?: No Use of a cane, walker or w/c?: Yes Grab bars in the bathroom?: No Shower chair or bench in shower?: No Elevated toilet seat or a handicapped toilet?: Yes  TIMED UP AND GO:  Was the test performed?  No  Cognitive Function: 6CIT completed    11/30/2016   11:15 AM  MMSE - Mini Mental State Exam  Orientation to time 5  Orientation to Place 5  Registration 3  Attention/ Calculation 5  Recall 3  Language- name 2 objects 2  Language- repeat 1  Language- follow 3 step command 3  Language- read & follow direction 1  Write a sentence 1  Copy design 1  Total score 30        01/30/2024    3:21 PM 01/28/2023    2:47 PM  6CIT Screen  What Year? 0 points 0 points  What month? 0 points 0 points  What time? 0 points 0 points  Count back from 20 0 points 0 points  Months in reverse 0 points 0 points  Repeat phrase 0 points 4 points  Total Score 0 points 4 points    Immunizations Immunization History  Administered Date(s) Administered   Fluad Quad(high Dose 65+) 06/12/2019, 07/22/2020, 07/01/2021, 07/28/2022   Fluad Trivalent(High Dose 65+) 06/30/2023   Influenza Split 07/20/2011, 07/05/2012   Influenza Whole 08/31/2007, 07/18/2008, 07/17/2009, 07/09/2010   Influenza, High Dose Seasonal PF 08/04/2013, 07/04/2015, 07/21/2016, 07/22/2017, 08/07/2018   Influenza,inj,Quad PF,6+ Mos 08/06/2014   PFIZER(Purple Top)SARS-COV-2 Vaccination 12/04/2019, 12/25/2019, 08/09/2020   Pneumococcal Conjugate-13 11/14/2014   Pneumococcal Polysaccharide-23 09/27/2007, 11/19/2015   Td 03/27/2008   Td (Adult), 2 Lf Tetanus Toxid, Preservative Free  03/27/2008   Tdap 09/22/2022   Zoster, Live 11/02/2011    Screening Tests Health Maintenance  Topic Date Due   Colonoscopy  03/03/2024   Zoster Vaccines- Shingrix (1 of 2) 12/21/2024 (Originally 03/12/1965)   INFLUENZA VACCINE  05/11/2024   Medicare Annual Wellness (AWV)  01/29/2025   DTaP/Tdap/Td (4 - Td or Tdap) 09/22/2032   Pneumonia Vaccine 39+ Years old  Completed   DEXA SCAN  Completed   Hepatitis C Screening  Completed   HPV VACCINES  Aged Out   Meningococcal B Vaccine  Aged Out   COVID-19 Vaccine  Discontinued    Health Maintenance  Health Maintenance Due  Topic Date Due   Colonoscopy  03/03/2024   Health Maintenance Items Addressed: Up to date  Additional Screening:  Vision Screening: Recommended annual ophthalmology exams for early detection of glaucoma and other disorders of the eye.  Dental Screening: Recommended annual dental exams for proper oral hygiene  Community Resource Referral / Chronic Care Management: CRR required this visit?  No   CCM required this visit?  No     Plan:     I have personally reviewed and noted the following  in the patient's chart:   Medical and social history Use of alcohol, tobacco or illicit drugs  Current medications and supplements including opioid prescriptions. Patient is not currently taking opioid prescriptions. Functional ability and status Nutritional status Physical activity Advanced directives List of other physicians Hospitalizations, surgeries, and ER visits in previous 12 months Vitals Screenings to include cognitive, depression, and falls Referrals and appointments  In addition, I have reviewed and discussed with patient certain preventive protocols, quality metrics, and best practice recommendations. A written personalized care plan for preventive services as well as general preventive health recommendations were provided to patient.     Areatha Beecham, LPN   [id number]   After Visit Summary:  (Pick Up) Due to this being a telephonic visit, with patients personalized plan was offered to patient and patient has requested to Pick up at office.  Notes: Nothing significant to report at this time.

## 2024-01-30 NOTE — Patient Instructions (Signed)
 [name] , Thank you for taking time to come for your Medicare Wellness Visit. I appreciate your ongoing commitment to your health goals. Please review the following plan we discussed and let me know if I can assist you in the future.   Referrals/Orders/Follow-Ups/Clinician Recommendations: none  This is a list of the screening recommended for you and due dates:  Health Maintenance  Topic Date Due   Colon Cancer Screening  03/03/2024   Zoster (Shingles) Vaccine (1 of 2) 12/21/2024*   Flu Shot  05/11/2024   Medicare Annual Wellness Visit  01/29/2025   DTaP/Tdap/Td vaccine (4 - Td or Tdap) 09/22/2032   Pneumonia Vaccine  Completed   DEXA scan (bone density measurement)  Completed   Hepatitis C Screening  Completed   HPV Vaccine  Aged Out   Meningitis B Vaccine  Aged Out   COVID-19 Vaccine  Discontinued  *Topic was postponed. The date shown is not the original due date.    Advanced directives: (ACP Link)Information on Advanced Care Planning can be found at [place]  Secretary of [facility] Advance Health Care Directives Advance Health Care Directives. [email or web address]   Next Medicare Annual Wellness Visit scheduled for next year: Yes  insert Preventive Care attachment Insert FALL PREVENTION attachment if needed

## 2024-01-31 ENCOUNTER — Encounter: Payer: Self-pay | Admitting: Gastroenterology

## 2024-01-31 ENCOUNTER — Ambulatory Visit: Admitting: Gastroenterology

## 2024-01-31 VITALS — BP 122/72 | HR 54 | Ht 66.0 in | Wt 175.0 lb

## 2024-01-31 DIAGNOSIS — E739 Lactose intolerance, unspecified: Secondary | ICD-10-CM | POA: Diagnosis not present

## 2024-01-31 DIAGNOSIS — R140 Abdominal distension (gaseous): Secondary | ICD-10-CM

## 2024-01-31 DIAGNOSIS — Z860100 Personal history of colon polyps, unspecified: Secondary | ICD-10-CM

## 2024-01-31 DIAGNOSIS — Z860101 Personal history of adenomatous and serrated colon polyps: Secondary | ICD-10-CM

## 2024-01-31 DIAGNOSIS — K589 Irritable bowel syndrome without diarrhea: Secondary | ICD-10-CM

## 2024-01-31 DIAGNOSIS — D509 Iron deficiency anemia, unspecified: Secondary | ICD-10-CM

## 2024-01-31 NOTE — Patient Instructions (Signed)
 You have been scheduled for a colonoscopy. Please follow written instructions given to you at your visit today.   If you use inhalers (even only as needed), please bring them with you on the day of your procedure.  DO NOT TAKE 7 DAYS PRIOR TO TEST- Trulicity (dulaglutide) Ozempic, Wegovy (semaglutide) Mounjaro (tirzepatide) Bydureon Bcise (exanatide extended release)  DO NOT TAKE 1 DAY PRIOR TO YOUR TEST Rybelsus (semaglutide) Adlyxin (lixisenatide) Victoza (liraglutide) Byetta (exanatide) ___________________________________________________________________________  Please follow up as needed if symptoms increase or worsen   Due to recent changes in healthcare laws, you may see the results of your imaging and laboratory studies on MyChart before your provider has had a chance to review them.  We understand that in some cases there may be results that are confusing or concerning to you. Not all laboratory results come back in the same time frame and the provider may be waiting for multiple results in order to interpret others.  Please give us  48 hours in order for your provider to thoroughly review all the results before contacting the office for clarification of your results.   _______________________________________________________  If your blood pressure at your visit was 140/90 or greater, please contact your primary care physician to follow up on this.  _______________________________________________________  If you are age 79 or older, your body mass index should be between 23-30. Your Body mass index is 28.25 kg/m. If this is out of the aforementioned range listed, please consider follow up with your Primary Care Provider.  If you are age 37 or younger, your body mass index should be between 19-25. Your Body mass index is 28.25 kg/m. If this is out of the aformentioned range listed, please consider follow up with your Primary Care Provider.    ________________________________________________________  The [facility] providers would like to encourage you to use MYCHART to communicate with providers for non-urgent requests or questions.  Due to long hold times on the telephone, sending your provider a message by [facility] may be a faster and more efficient way to get a response.  Please allow 48 business hours for a response.  Please remember that this is for non-urgent requests.  _______________________________________________________ Thank you for trusting me with your gastrointestinal care!   [name], PA

## 2024-01-31 NOTE — Progress Notes (Signed)
 Chief Complaint: Recall colonoscopy Primary GI MD: Dr. Dominic Friendly  HPI: Discussed the use of AI scribe software for clinical note transcription with the patient, who gave verbal consent to proceed.  History of Present Illness [name] "Conny Del" is a 78 year old female with IBS and mild anemia who presents for follow-up of her gastrointestinal symptoms.  Previously seen for IBS-D by Amy Esterwood recommended to start on Benefiber.  Her IBS symptoms have somewhat improved since starting iron  supplementation earlier this year due to low iron  levels.  She was initially diagnosed with mild anemia in May 2024, with a hemoglobin level of 11.1, and her iron  studies showed borderline iron  deficiency. A follow-up in December 2024 showed no anemia and improved iron  levels.  She has been experiencing black stools since starting the iron  supplementation, which she attributes to the medication. Additionally, she has a hemorrhoid that causes bleeding when she strains during bowel movements, but she only notices blood when wiping.  She experiences gas, particularly in the mornings, which becomes foul-smelling once she is up and moving around. Her bowel movements have a strong odor. She has observed that eating salads results in seeing undigested bits in her stool the following day. She cooks most of her vegetables, such as carrots and broccoli, to aid digestion.  She recalls a previous discussion about a SIBO breath test but did not pursue it due to time constraints and her symptoms improving with the current treatment. She is interested in dietary modifications to manage her symptoms better.   PREVIOUS GI WORKUP   Colonoscopy 02/2021 with two 2-12 mm tubular adenomas in ascending colon and cecum - Diverticulosis in left colon - External and internal hemorrhoids  Past Medical History:  Diagnosis Date   Allergy    Arthritis    Asthma    mild   Blood transfusion without reported diagnosis    H/O  bronchitis    Hemorrhoid    Hx of blood clots 1967   from birth control in LUE   Hypertension    Hypoglycemia    Hypothyroidism    Migraine    from food   Osteopenia 07/04/2019   Pneumonia    hx of   Seasonal allergies    Sleep apnea    CPAP   UTI (urinary tract infection)     Past Surgical History:  Procedure Laterality Date   CATARACT EXTRACTION     rt eye   COLONOSCOPY  05/13/2008   Howard Macho   detatched retina  2003   rt eye   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     albuterol  (PROAIR  HFA) 108 (90 Base) MCG/ACT inhaler Inhale 2 puffs into the lungs every 6 (six) hours as needed for wheezing or shortness of breath. 1 each 0   Cholecalciferol (VITAMIN D ) 2000 UNITS CAPS Take 2,000 Units by mouth daily.     fexofenadine (ALLEGRA) 60 MG tablet Take 60 mg by mouth daily.     fluticasone  (FLONASE ) 50 MCG/ACT nasal spray Place 2 sprays into both nostrils daily. 16 g 6   Iron , Ferrous Sulfate , 325 (65 Fe) MG TABS Take 325 mg by mouth every other day. 45 tablet  2   levothyroxine  (SYNTHROID ) 50 MCG tablet Take 1 tablet (50 mcg total) by mouth daily. 90 tablet 2   lisinopril  (ZESTRIL ) 20 MG tablet TAKE 1 TABLET BY MOUTH AT BEDTIME 90 tablet 3   Menthol , Topical Analgesic, (BIOFREEZE EX) Apply topically.     Multiple Vitamins-Minerals (MULTIVITAMIN WITH MINERALS) tablet Take 1 tablet by mouth daily.     nystatin  (MYCOSTATIN /NYSTOP ) powder Apply topically 2 (two) times daily as needed. Beneath both breasts 15 g 0   OXYQUINOLONE SULFATE VAGINAL (TRIMO-SAN) 0.025 % GEL Use one applicator full vaginally 2 x a week at hs 113.4 g 1   triamcinolone  ointment (KENALOG ) 0.1 % Apply  once daily to hands as needed for hand eczema only. 30 g 2   hyoscyamine  (LEVSIN SL) 0.125 MG SL tablet Place 1 tablet (0.125 mg total) under the tongue 2 (two) times daily as needed (for urgency and loose stools). (Patient not taking: Reported on 01/31/2024) 60 tablet 6   No current facility-administered medications for this visit.    Allergies as of 01/31/2024 - Review Complete 01/31/2024  Allergen Reaction Noted   Codeine Hives and Nausea And Vomiting 11/16/2023   Pork-derived products Other (See Comments) 06/13/2015   Hydrocodone Other (See Comments) 06/22/2022   Prednisone  Other (See Comments) 07/06/2013    Family History  Problem Relation Age of Onset   Thyroid  disease Mother    Osteoarthritis Mother    Thyroid  disease Father    Osteoarthritis Father    Osteoarthritis Maternal Grandmother    Colon cancer Maternal Grandmother 70   Osteoarthritis Maternal Grandfather    Osteoarthritis Paternal Grandmother    Esophageal cancer Neg Hx    Rectal cancer Neg Hx    Stomach cancer Neg Hx    Colon polyps Neg Hx     Social History   Socioeconomic History   Marital status: Legally Separated    Spouse name: Not on file   Number of children: Not on file   Years of education: Not on file   Highest education level: Not on file  Occupational History   Not on file  Tobacco Use   Smoking status: Former    Current packs/day: 0.00    Average packs/day: 3.0 packs/day for 13.0 years (39.0 ttl pk-yrs)    Types: Cigarettes    Start date: 10/11/1961    Quit date: 10/11/1974    Years since quitting: 49.3    Passive exposure: Never   Smokeless tobacco: Never  Vaping Use   Vaping status: Never Used  Substance and Sexual Activity   Alcohol use: No    Alcohol/week: 0.0 standard drinks of alcohol   Drug use: No   Sexual activity: Not Currently  Other Topics Concern   Not on file  Social History Narrative   Not on file   Social Drivers of Health   Financial Resource Strain: Low Risk   (01/30/2024)   Overall Financial Resource Strain (CARDIA)    Difficulty of Paying Living Expenses: Not hard at all  Food Insecurity: No Food Insecurity (01/30/2024)   Hunger Vital Sign    Worried About Running Out of Food in the Last Year: Never true    Ran Out of Food in the Last Year: Never true  Transportation Needs: No Transportation Needs (01/30/2024)   PRAPARE - Administrator, Civil Service (Medical): No    Lack of Transportation (Non-Medical): No  Physical Activity: Inactive (01/30/2024)   Exercise Vital Sign    Days of Exercise  per Week: 0 days    Minutes of Exercise per Session: 0 min  Stress: No Stress Concern Present (01/30/2024)   [employer] of Occupational Health - Occupational Stress Questionnaire    Feeling of Stress : Not at all  Social Connections: Socially Isolated (01/30/2024)   Social Connection and Isolation Panel [NHANES]    Frequency of Communication with Friends and Family: More than three times a week    Frequency of Social Gatherings with Friends and Family: Once a week    Attends Religious Services: Never    Database administrator or Organizations: No    Attends Banker Meetings: Never    Marital Status: Separated  Intimate Partner Violence: Not At Risk (01/30/2024)   Humiliation, Afraid, Rape, and Kick questionnaire    Fear of Current or Ex-Partner: No    Emotionally Abused: No    Physically Abused: No    Sexually Abused: No    Review of Systems:    Constitutional: No weight loss, fever, chills, weakness or fatigue HEENT: Eyes: No change in vision               Ears, Nose, Throat:  No change in hearing or congestion Skin: No rash or itching Cardiovascular: No chest pain, chest pressure or palpitations   Respiratory: No SOB or cough Gastrointestinal: See HPI and otherwise negative Genitourinary: No dysuria or change in urinary frequency Neurological: No headache, dizziness or syncope Musculoskeletal: No new muscle or  joint pain Hematologic: No bleeding or bruising Psychiatric: No history of depression or anxiety    Physical Exam:  Vital signs: BP 122/72   Pulse (!) 54   Ht 5\' 6"  (1.676 m)   Wt 175 lb (79.4 kg)   BMI 28.25 kg/m   Constitutional: NAD, alert and cooperative Head:  Normocephalic and atraumatic. Eyes:   PEERL, EOMI. No icterus. Conjunctiva pink. Respiratory: Respirations even and unlabored. Lungs clear to auscultation bilaterally.   No wheezes, crackles, or rhonchi.  Cardiovascular:  Regular rate and rhythm. No peripheral edema, cyanosis or pallor.  Gastrointestinal:  Soft, nondistended, nontender. No rebound or guarding. Normal bowel sounds. No appreciable masses or hepatomegaly. Rectal:  Declines Msk:  Symmetrical without gross deformities. Without edema, no deformity or joint abnormality.  Neurologic:  Alert and  oriented x4;  grossly normal neurologically.  Skin:   Dry and intact without significant lesions or rashes. Psychiatric: Oriented to person, place and time. Demonstrates good judgement and reason without abnormal affect or behaviors.   RELEVANT LABS AND IMAGING: CBC    Component Value Date/Time   WBC 4.5 12/26/2023 1040   RBC 3.97 12/26/2023 1040   HGB 12.5 12/26/2023 1040   HCT 37.7 12/26/2023 1040   PLT 209.0 12/26/2023 1040   MCV 95.1 12/26/2023 1040   MCH 30.6 02/18/2023 1644   MCHC 33.2 12/26/2023 1040   RDW 13.7 12/26/2023 1040   LYMPHSABS 1.0 05/15/2015 0901   MONOABS 0.3 05/15/2015 0901   EOSABS 0.1 05/15/2015 0901   BASOSABS 0.0 05/15/2015 0901    CMP     Component Value Date/Time   NA 141 12/26/2023 1040   K 4.2 12/26/2023 1040   CL 105 12/26/2023 1040   CO2 28 12/26/2023 1040   GLUCOSE 85 12/26/2023 1040   BUN 19 12/26/2023 1040   CREATININE 0.86 12/26/2023 1040   CREATININE 0.89 02/18/2023 1644   CALCIUM  9.6 12/26/2023 1040   PROT 7.1 03/30/2023 1057   ALBUMIN 4.2 03/30/2023 1057   AST  21 03/30/2023 1057   ALT 10 03/30/2023 1057    ALKPHOS 50 03/30/2023 1057   BILITOT 0.7 03/30/2023 1057   GFRNONAA 84 (L) 09/21/2013 0455   GFRAA >90 09/21/2013 0455     Assessment/Plan:   IBS Recent CBC, TSH, iron  studies, BMP normal. intermittent episodes of urgent loose stools which may last 2 to 3 days alternating.  Some normal bowel movements.  Lactose intolerance.  recommendation of Benefiber January 2024 with consideration of SIBO which was not pursued. Improved on iron  supplementation.  Continuing to have bowel gas. - continue iron  supplementation - if return of symptoms, please re-initiate benefiber - Low FODMAP diet for gas - use simethicone as needed  History of adenomatous colon polyps Colonoscopy May 2022 history of polyps indicated for 3-year follow-up (May 2025) - Scheduled for colonoscopy - I thoroughly discussed the procedure with the patient (at bedside) to include nature of the procedure, alternatives, benefits, and risks (including but not limited to bleeding, infection, perforation, anesthesia/cardiac pulmonary complications).  Patient verbalized understanding and gave verbal consent to proceed with procedure.   IDA Mild anemia with Hgb 11.1 noted May 2024 with iron  67, saturation 20%, ferritin 32.  Put on iron  supplement by PCP at that time.  March 2025 with Hgb 12.5, iron  66, saturation 19%, ferritin 33.  No overt bleeding.  With very mild iron  deficiency, no overt bleeding, and no current anemia additionally with her history of kyphosis noted on physical exam today suspect it would be best to defer EGD at this time.  If worsening anemia or overt bleeding can reconsider. - Continue iron  supplementation - Will discuss further with Dr. Dominic Friendly   This visit required 35 minutes of patient care (this includes precharting, chart review, review of results, face-to-face time used for counseling as well as treatment plan and follow-up. The patient was provided an opportunity to ask questions and all were answered. The  patient agreed with the plan and demonstrated an understanding of the instructions.   Gigi Kyle [facility] 01/31/2024, 3:58 PM  Cc: Tonna Frederic

## 2024-02-01 ENCOUNTER — Telehealth: Payer: Self-pay | Admitting: Gastroenterology

## 2024-02-01 DIAGNOSIS — Z860100 Personal history of colon polyps, unspecified: Secondary | ICD-10-CM

## 2024-02-01 DIAGNOSIS — D509 Iron deficiency anemia, unspecified: Secondary | ICD-10-CM

## 2024-02-01 NOTE — Telephone Encounter (Signed)
 PT is calling to find out exactly what procedure she would be getting in May. She was under the impression that she would be having both an EGD/colon. She would like to speak with a nurse as to why she is only having just one procedure. Please advise.

## 2024-02-01 NOTE — Telephone Encounter (Signed)
 Spoke to patient to advise reasoning behind possibly completing only colonoscopy vs endoscopy/colonoscopy for IDA as per [name], PA-C recent office note. I have advised however that [name] has sent her note to Dr Dominic Friendly for review and final say as to whether both endo and colon should be completed or just colonoscopy. Patient verbalizes understanding of this information.

## 2024-02-03 NOTE — Telephone Encounter (Signed)
 I have spoken to patient to advise that we will complete endoscopy and colonoscopy at the same time as per Dr Dominic Friendly. Advised no changes in prep but we do need her to arrive at 2:30 pm on 03/01/24 rather than 3 to accommodate for the additional procedure. She verbalizes understanding. Appointment updated in system and updated ambulatory referral placed to check for endo insurance coverage.

## 2024-02-03 NOTE — Addendum Note (Signed)
 Addended by: Glennette Lanius on: 02/03/2024 04:30 PM   Modules accepted: Orders

## 2024-02-03 NOTE — Progress Notes (Signed)
 ____________________________________________________________  Attending physician addendum:  Thank you for sending this case to me. I have reviewed the entire note and agree with the plan.  However, since she did have iron  deficiency anemia that improved on supplementation, I feel that she should have the EGD at the same time as her colonoscopy.  If she is agreeable to doing that, please add that to the schedule since, as the schedule stands now on that date, there is sufficient time to do so in the same block.    Lorella Roles, MD  ____________________________________________________________

## 2024-02-13 ENCOUNTER — Encounter: Payer: Self-pay | Admitting: Family Medicine

## 2024-02-13 ENCOUNTER — Ambulatory Visit (INDEPENDENT_AMBULATORY_CARE_PROVIDER_SITE_OTHER): Admitting: Family Medicine

## 2024-02-13 VITALS — BP 114/72 | HR 62 | Temp 97.7°F | Ht 66.0 in | Wt 176.4 lb

## 2024-02-13 DIAGNOSIS — N3945 Continuous leakage: Secondary | ICD-10-CM | POA: Diagnosis not present

## 2024-02-13 NOTE — Progress Notes (Signed)
 Established Patient Office Visit   Subjective:  Patient ID: [name], female    DOB: 03-Aug-1946  Age: 78 y.o. MRN: [id number]  Chief Complaint  Patient presents with   Urinary Incontinence    Pt states bladder control has gotten worse in the past 1-2 months. Pt also complains of inconsistent stream when voiding. Pt requesting urology referral.    Shoulder Pain    Bilateral should pain with limited mobility and cracking noises. Pt uses tylenol  for pain.     Shoulder Pain  Pertinent negatives include no tingling.   Encounter Diagnoses  Name Primary?   Continuous leakage of urine Yes   1 year history of slowly worsening urinary incontinence.  She denies dysuria, frequency or urgency.  Says that she loses her urine when she stands up in the morning after getting out of the bed but then feels as though she has not emptied her bladder completely by the time she makes it to the toilet.  Status post 3 vaginal deliveries.  Last baby weighed 9 pounds.  Glucola testing for her pregnancies was negative for gestational diabetes.   Review of Systems  Constitutional: Negative.   HENT: Negative.    Eyes:  Negative for blurred vision, discharge and redness.  Respiratory: Negative.    Cardiovascular: Negative.   Gastrointestinal:  Negative for abdominal pain.  Genitourinary: Negative.  Negative for dysuria, frequency and urgency.  Musculoskeletal: Negative.  Negative for myalgias.  Skin:  Negative for rash.  Neurological:  Negative for tingling, loss of consciousness and weakness.  Endo/Heme/Allergies:  Negative for polydipsia.     Current Outpatient Medications:    acetaminophen  (TYLENOL ) 500 MG tablet, Take 500-1,000 mg by mouth as needed for mild pain or moderate pain., Disp: , Rfl:    albuterol  (PROAIR  HFA) 108 (90 Base) MCG/ACT inhaler, Inhale 2 puffs into the lungs every 6 (six) hours as needed for wheezing or shortness of breath., Disp: 1 each, Rfl: 0   Cholecalciferol (VITAMIN  D) 2000 UNITS CAPS, Take 2,000 Units by mouth daily., Disp: , Rfl:    fexofenadine (ALLEGRA) 60 MG tablet, Take 60 mg by mouth daily., Disp: , Rfl:    fluticasone  (FLONASE ) 50 MCG/ACT nasal spray, Place 2 sprays into both nostrils daily., Disp: 16 g, Rfl: 6   Iron , Ferrous Sulfate , 325 (65 Fe) MG TABS, Take 325 mg by mouth every other day., Disp: 45 tablet, Rfl: 2   levothyroxine  (SYNTHROID ) 50 MCG tablet, Take 1 tablet (50 mcg total) by mouth daily., Disp: 90 tablet, Rfl: 2   lisinopril  (ZESTRIL ) 20 MG tablet, TAKE 1 TABLET BY MOUTH AT BEDTIME, Disp: 90 tablet, Rfl: 3   Menthol , Topical Analgesic, (BIOFREEZE EX), Apply topically., Disp: , Rfl:    Multiple Vitamins-Minerals (MULTIVITAMIN WITH MINERALS) tablet, Take 1 tablet by mouth daily., Disp: , Rfl:    nystatin  (MYCOSTATIN /NYSTOP ) powder, Apply topically 2 (two) times daily as needed. Beneath both breasts, Disp: 15 g, Rfl: 0   OXYQUINOLONE SULFATE VAGINAL (TRIMO-SAN) 0.025 % GEL, Use one applicator full vaginally 2 x a week at hs, Disp: 113.4 g, Rfl: 1   triamcinolone  ointment (KENALOG ) 0.1 %, Apply once daily to hands as needed for hand eczema only., Disp: 30 g, Rfl: 2   hyoscyamine  (LEVSIN SL) 0.125 MG SL tablet, Place 1 tablet (0.125 mg total) under the tongue 2 (two) times daily as needed (for urgency and loose stools). (Patient not taking: Reported on 02/13/2024), Disp: 60 tablet, Rfl: 6   Objective:  BP 114/72 (BP Location: Right Arm, Patient Position: Sitting, Cuff Size: Normal)   Pulse 62   Temp 97.7 F (36.5 C) (Temporal)   Ht 5\' 6"  (1.676 m)   Wt 176 lb 6.4 oz (80 kg)   SpO2 99%   BMI 28.47 kg/m    Physical Exam Constitutional:      General: She is not in acute distress.    Appearance: Normal appearance. She is not ill-appearing, toxic-appearing or diaphoretic.  HENT:     Head: Normocephalic and atraumatic.     Right Ear: External ear normal.     Left Ear: External ear normal.  Eyes:     General: No scleral icterus.        Right eye: No discharge.        Left eye: No discharge.     Extraocular Movements: Extraocular movements intact.     Conjunctiva/sclera: Conjunctivae normal.  Pulmonary:     Effort: Pulmonary effort is normal. No respiratory distress.  Skin:    General: Skin is warm and dry.  Neurological:     Mental Status: She is alert and oriented to person, place, and time.  Psychiatric:        Mood and Affect: Mood normal.        Behavior: Behavior normal.      No results found for any visits on 02/13/24.    The 10-year ASCVD risk score (Arnett DK, et al., 2019) is: 24.5%    Assessment & Plan:   Continuous leakage of urine -     Urinalysis, Routine w reflex microscopic -     Urine Culture -     Ambulatory referral to Gynecology    No follow-ups on file.  Checking UA with [facility].  Referral for urogynecology.  Tonna Frederic, MD

## 2024-02-22 ENCOUNTER — Encounter: Payer: Self-pay | Admitting: Gastroenterology

## 2024-02-28 DIAGNOSIS — H5213 Myopia, bilateral: Secondary | ICD-10-CM | POA: Diagnosis not present

## 2024-02-28 DIAGNOSIS — H2512 Age-related nuclear cataract, left eye: Secondary | ICD-10-CM | POA: Diagnosis not present

## 2024-02-28 DIAGNOSIS — H35371 Puckering of macula, right eye: Secondary | ICD-10-CM | POA: Diagnosis not present

## 2024-02-28 DIAGNOSIS — Z961 Presence of intraocular lens: Secondary | ICD-10-CM | POA: Diagnosis not present

## 2024-03-01 ENCOUNTER — Encounter: Payer: Self-pay | Admitting: Gastroenterology

## 2024-03-01 ENCOUNTER — Ambulatory Visit (AMBULATORY_SURGERY_CENTER): Admitting: Gastroenterology

## 2024-03-01 VITALS — BP 162/73 | HR 54 | Temp 97.9°F | Resp 11 | Ht 66.0 in | Wt 175.0 lb

## 2024-03-01 DIAGNOSIS — R195 Other fecal abnormalities: Secondary | ICD-10-CM

## 2024-03-01 DIAGNOSIS — R197 Diarrhea, unspecified: Secondary | ICD-10-CM | POA: Diagnosis not present

## 2024-03-01 DIAGNOSIS — R140 Abdominal distension (gaseous): Secondary | ICD-10-CM | POA: Diagnosis not present

## 2024-03-01 DIAGNOSIS — K589 Irritable bowel syndrome without diarrhea: Secondary | ICD-10-CM | POA: Diagnosis not present

## 2024-03-01 DIAGNOSIS — Z860101 Personal history of adenomatous and serrated colon polyps: Secondary | ICD-10-CM

## 2024-03-01 DIAGNOSIS — B9681 Helicobacter pylori [H. pylori] as the cause of diseases classified elsewhere: Secondary | ICD-10-CM | POA: Diagnosis not present

## 2024-03-01 DIAGNOSIS — K209 Esophagitis, unspecified without bleeding: Secondary | ICD-10-CM

## 2024-03-01 DIAGNOSIS — D509 Iron deficiency anemia, unspecified: Secondary | ICD-10-CM

## 2024-03-01 DIAGNOSIS — K2970 Gastritis, unspecified, without bleeding: Secondary | ICD-10-CM

## 2024-03-01 DIAGNOSIS — Q438 Other specified congenital malformations of intestine: Secondary | ICD-10-CM

## 2024-03-01 DIAGNOSIS — K648 Other hemorrhoids: Secondary | ICD-10-CM | POA: Diagnosis not present

## 2024-03-01 DIAGNOSIS — D128 Benign neoplasm of rectum: Secondary | ICD-10-CM

## 2024-03-01 DIAGNOSIS — E739 Lactose intolerance, unspecified: Secondary | ICD-10-CM

## 2024-03-01 DIAGNOSIS — Z860100 Personal history of colon polyps, unspecified: Secondary | ICD-10-CM | POA: Diagnosis not present

## 2024-03-01 DIAGNOSIS — K2950 Unspecified chronic gastritis without bleeding: Secondary | ICD-10-CM | POA: Diagnosis not present

## 2024-03-01 DIAGNOSIS — K529 Noninfective gastroenteritis and colitis, unspecified: Secondary | ICD-10-CM

## 2024-03-01 MED ORDER — SODIUM CHLORIDE 0.9 % IV SOLN: 500.0000 mL | Freq: Once | INTRAVENOUS | Status: DC

## 2024-03-01 NOTE — Op Note (Signed)
 [facility] Patient Name: [name] Procedure Date: 03/01/2024 3:27 PM MRN: [id number] Endoscopist: Ace Abu L. 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 Hemoglobin normalized shortly afterward with                            persistently normal hemoglobin and iron  studies                            since then. Medicines:                Monitored Anesthesia Care Procedure:                Pre-Anesthesia Assessment:                           - Prior to the procedure, a History and Physical                            was performed, and patient medications and                            allergies were reviewed. The patient's tolerance of                            previous anesthesia was also reviewed. The risks                            and benefits of the procedure and the sedation                            options and risks were discussed with the patient.  All questions were answered, and informed consent                            was obtained. Prior Anticoagulants: The patient has                            taken no anticoagulant or antiplatelet agents. ASA                            Grade Assessment: III - A patient with severe                            systemic disease. After reviewing the risks and                            benefits, the patient was deemed in satisfactory                             condition to undergo the procedure.                           After obtaining informed consent, the colonoscope                            was passed under direct vision. Throughout the                            procedure, the patient's blood pressure, pulse, and                            oxygen saturations were monitored continuously. The                            Olympus Scope SN [id number] was introduced through the                            anus and advanced to the the terminal ileum, with                            identification of the appendiceal orifice and IC                            valve. The colonoscopy was somewhat difficult due                            to a redundant colon. Successful completion of the                            procedure was aided by using manual pressure and                            straightening and shortening the scope to obtain  bowel loop reduction. The patient tolerated the                            procedure well. The quality of the bowel                            preparation was good. The terminal ileum, ileocecal                            valve, appendiceal orifice, and rectum were                            photographed. Scope In: 3:44:23 PM Scope Out: 4:00:21 PM Scope Withdrawal Time: 0 hours 10 minutes 41 seconds  Total Procedure Duration: 0 hours 15 minutes 58 seconds  Findings:                 The perianal and digital rectal examinations were                            normal.                           The terminal ileum appeared normal.                           Repeat examination of right colon under NBI                            performed.                           Normal mucosa was found in the entire colon.                            Biopsies for histology were taken with a cold                            forceps from the right colon and left colon for                            evaluation of  microscopic colitis.                           A diminutive polyp was found in the proximal                            rectum. The polyp was sessile. The polyp was                            removed with a cold snare. Resection and retrieval                            were complete.  Internal hemorrhoids were found.                           Retroflexion in the rectum was not performed due to                            anatomy. Complications:            No immediate complications. Estimated Blood Loss:     Estimated blood loss was minimal. Impression:               - The examined portion of the ileum was normal.                           - Normal mucosa in the entire examined colon.                            Biopsied.                           - One diminutive polyp in the proximal rectum,                            removed with a cold snare. Resected and retrieved.                           - Internal hemorrhoids. Recommendation:           - Patient has a contact number available for                            emergencies. The signs and symptoms of potential                            delayed complications were discussed with the                            patient. Return to normal activities tomorrow.                            Written discharge instructions were provided to the                            patient.                           - Resume previous diet.                           - Continue present medications.                           - Await pathology results.                           - See the other procedure note for documentation of  additional recommendations.                           - No repeat routine colonoscopy due to age, current                            guidelines and low risk findings today. Jacarius Handel L. Dominic Friendly, MD 03/01/2024 4:27:33 PM This report has been signed electronically.

## 2024-03-01 NOTE — Progress Notes (Signed)
 Sedate, gd SR, tolerated procedure well, VSS, report to RN

## 2024-03-01 NOTE — Progress Notes (Signed)
 No significant changes to clinical history since GI office visit on 01/31/24.  The patient is appropriate for an endoscopic procedure in the ambulatory setting.  - Lorella Roles, MD

## 2024-03-01 NOTE — Patient Instructions (Addendum)
 - Resume previous diet.                           - Continue present medications.                           - Await pathology results.                           - See the other procedure note for documentation of                            additional recommendations.                           - No repeat routine colonoscopy due to age, current                            guidelines and low risk findings today.                           - Discontinue iron  tablets b/c they are worsening                            digestive upset and the CBC and iron  studies were                            normal as recently as 2 months ago. (see above)                           Check CBC and iron  studies in 6-8 weeks.                           If recurrence of iron  deficiency, patient would                            benefit from IV iron  and may need a VCE (if Bx neg                            for sprue).  YOU HAD AN ENDOSCOPIC PROCEDURE TODAY AT THE [facility]:   Refer to the procedure report that was given to you for any specific questions about what was found during the examination.  If the procedure report does not answer your questions, please call your gastroenterologist to clarify.  If you requested that your care partner not be given the details of your procedure findings, then the procedure report has been included in a sealed envelope for you to review at your convenience later.  YOU SHOULD EXPECT: Some feelings of bloating in the abdomen. Passage of more gas than usual.  Walking can help get rid of the air that was put into your GI tract during the procedure and reduce the bloating. If you had a lower endoscopy (such as a colonoscopy or flexible sigmoidoscopy) you may notice spotting of blood in your stool or on the toilet paper. If you underwent a bowel prep for your procedure,  you may not have a normal bowel movement for a few days.  Please Note:  You might notice  some irritation and congestion in your nose or some drainage.  This is from the oxygen used during your procedure.  There is no need for concern and it should clear up in a day or so.  SYMPTOMS TO REPORT IMMEDIATELY:  Following lower endoscopy (colonoscopy or flexible sigmoidoscopy):  Excessive amounts of blood in the stool  Significant tenderness or worsening of abdominal pains  Swelling of the abdomen that is new, acute  Fever of 100F or higher  Following upper endoscopy (EGD)  Vomiting of blood or coffee ground material  New chest pain or pain under the shoulder blades  Painful or persistently difficult swallowing  New shortness of breath  Fever of 100F or higher  Black, tarry-looking stools  For urgent or emergent issues, a gastroenterologist can be reached at any hour by calling [phone]. Do not use MyChart messaging for urgent concerns.    DIET:  We do recommend a small meal at first, but then you may proceed to your regular diet.  Drink plenty of fluids but you should avoid alcoholic beverages for 24 hours.  ACTIVITY:  You should plan to take it easy for the rest of today and you should NOT DRIVE or use heavy machinery until tomorrow (because of the sedation medicines used during the test).    FOLLOW UP: Our staff will call the number listed on your records the next business day following your procedure.  We will call around 7:15- 8:00 am to check on you and address any questions or concerns that you may have regarding the information given to you following your procedure. If we do not reach you, we will leave a message.     If any biopsies were taken you will be contacted by phone or by letter within the next 1-3 weeks.  Please call us  at [phone] if you have not heard about the biopsies in 3 weeks.    SIGNATURES/CONFIDENTIALITY: You and/or your care partner have signed paperwork which will be entered into your electronic medical record.  These signatures attest  to the fact that that the information above on your After Visit Summary has been reviewed and is understood.  Full responsibility of the confidentiality of this discharge information lies with you and/or your care-partner.

## 2024-03-01 NOTE — Progress Notes (Signed)
 Called to room to assist during endoscopic procedure.  Patient ID and intended procedure confirmed with present staff. Received instructions for my participation in the procedure from the performing physician.

## 2024-03-01 NOTE — Op Note (Signed)
 [facility] Patient Name: [name] Procedure Date: 03/01/2024 3:27 PM MRN: [id number] Endoscopist: Ace Abu L. Dominic Friendly , MD, [id number] Age: 78 Referring MD:  Date of Birth: [birth year] Gender: Female Account #: [id number] Procedure:                Upper GI endoscopy Indications:              Unexplained iron  deficiency anemia Medicines:                Monitored Anesthesia Care Procedure:                Pre-Anesthesia Assessment:                           - Prior to the procedure, a History and Physical                            was performed, and patient medications and                            allergies were reviewed. The patient's tolerance of                            previous anesthesia was also reviewed. The risks                            and benefits of the procedure and the sedation                            options and risks were discussed with the patient.                            All questions were answered, and informed consent                            was obtained. Prior Anticoagulants: The patient has                            taken no anticoagulant or antiplatelet agents. ASA                            Grade Assessment: III - A patient with severe                            systemic disease. After reviewing the risks and                            benefits, the patient was deemed in satisfactory                            condition to undergo the procedure.                           After obtaining informed consent, the endoscope was  passed under direct vision. Throughout the                            procedure, the patient's blood pressure, pulse, and                            oxygen saturations were monitored continuously. The                            GIF [id number] was introduced through the                            mouth, and advanced to the second part of duodenum.                            The upper GI  endoscopy was accomplished without                            difficulty. The patient tolerated the procedure                            well. Scope In: Scope Out: Findings:                 The larynx was normal.                           Patchy, white/yellow plaques were found in the                            entire esophagus. Biopsies were taken with a cold                            forceps for histology.                           Diffuse mucosal changes characterized by atrophy                            and mild erythema were found in the entire examined                            stomach. Several biopsies were obtained on the                            greater curvature of the gastric body, on the                            lesser curvature of the gastric body, on the                            greater curvature of the gastric antrum and on the                            lesser curvature of  the gastric antrum with cold                            forceps for histology.                           The exam of the stomach was otherwise normal.                           The cardia and gastric fundus were normal on                            retroflexion.                           Patchy mild mucosal changes characterized by                            scalloping were found in the second portion of the                            duodenum. Several biopsies were obtained in the                            duodenal bulb and in the second portion of the                            duodenum with cold forceps for evaluation of celiac                            disease.                           The exam of the duodenum was otherwise normal. Complications:            No immediate complications. Estimated Blood Loss:     Estimated blood loss was minimal. Impression:               - Normal larynx.                           - Esophageal plaques were found, suspicious for                             candidiasis. Biopsied.                           - Atrophic and erythematous mucosa in the stomach.                            Nonspecific findins of unclear clinical                            significance.                           - Mucosal changes in the duodenum.  Unclear clinical                            significance.                           - Several biopsies were obtained on the greater                            curvature of the gastric body, on the lesser                            curvature of the gastric body, on the greater                            curvature of the gastric antrum and on the lesser                            curvature of the gastric antrum.                           - Several biopsies were obtained in the duodenal                            bulb and in the second portion of the duodenum. Recommendation:           - Patient has a contact number available for                            emergencies. The signs and symptoms of potential                            delayed complications were discussed with the                            patient. Return to normal activities tomorrow.                            Written discharge instructions were provided to the                            patient.                           - Resume previous diet.                           - Continue present medications.                           - Await pathology results.                           - See the other procedure note for documentation of  additional recommendations.                           - Discontinue iron  tablets b/c they are worsening                            digestive upset and the CBC and iron  studies were                            normal as recently as 2 months ago. (see above)                           Check CBC and iron  studies in 6-8 weeks.                           If recurrence of iron  deficiency, patient would                             benefit from IV iron  and may need a VCE (if Bx neg                            for sprue). Luisfernando Brightwell L. Dominic Friendly, MD 03/01/2024 4:34:30 PM This report has been signed electronically.

## 2024-03-01 NOTE — Progress Notes (Signed)
 Pt's states no medical or surgical changes since previsit or office visit.

## 2024-03-02 ENCOUNTER — Telehealth: Payer: Self-pay | Admitting: *Deleted

## 2024-03-02 NOTE — Telephone Encounter (Signed)
 Attempted post procedure follow up call.  No answer - LVM.

## 2024-03-07 ENCOUNTER — Telehealth: Payer: Self-pay

## 2024-03-07 DIAGNOSIS — D509 Iron deficiency anemia, unspecified: Secondary | ICD-10-CM

## 2024-03-07 LAB — SURGICAL PATHOLOGY

## 2024-03-07 NOTE — Telephone Encounter (Signed)
 Pt informed provider would like to check CBC and Iron  studies in 6-8 wks. Orders placed and pt will return to the lab the week of 04/18/24. Pt states she is feeling better since stopping her iron . Tablets. She reports not having to bare down when having a BM since D/C the iron .

## 2024-03-11 ENCOUNTER — Ambulatory Visit: Payer: Self-pay | Admitting: Gastroenterology

## 2024-03-12 MED ORDER — TETRACYCLINE HCL 500 MG PO TABS: 500.0000 mg | ORAL_TABLET | Freq: Four times a day (QID) | ORAL | 0 refills | Status: AC

## 2024-03-12 MED ORDER — BISMUTH SUBSALICYLATE 262 MG PO CHEW: 524.0000 mg | CHEWABLE_TABLET | Freq: Four times a day (QID) | ORAL | 0 refills | Status: AC

## 2024-03-12 MED ORDER — METRONIDAZOLE 250 MG PO TABS
250.0000 mg | ORAL_TABLET | Freq: Four times a day (QID) | ORAL | 0 refills | Status: AC
Start: 2024-03-12 — End: 2024-03-26

## 2024-03-12 MED ORDER — OMEPRAZOLE 20 MG PO CPDR
20.0000 mg | DELAYED_RELEASE_CAPSULE | Freq: Two times a day (BID) | ORAL | 0 refills | Status: DC
Start: 2024-03-12 — End: 2024-07-17

## 2024-03-21 ENCOUNTER — Telehealth: Payer: Self-pay | Admitting: Gastroenterology

## 2024-03-21 MED ORDER — FLUCONAZOLE 100 MG PO TABS
100.0000 mg | ORAL_TABLET | Freq: Once | ORAL | 0 refills | Status: AC
Start: 2024-03-21 — End: 2024-03-21

## 2024-03-21 NOTE — Telephone Encounter (Signed)
 Patient called and stated that she has been prescribed some antibiotic and has been taking them for about 1 week but she has seemed to developed a yeast infection. Patient is wondering if she need to reach out to her primary care or can Dr. Dominic Friendly give her some thing for it since it was caused by the antibiotic that was prescribed to her. Patient is requesting a call back. Please advise.

## 2024-03-21 NOTE — Telephone Encounter (Signed)
 Called patient, she reports severe itching and occasional burning especially with urination. She reports this started after starting the antibiotics for the H Pylori infection. Would like to know if we can prescribe something for this.

## 2024-03-21 NOTE — Telephone Encounter (Signed)
 I sent a prescription for one dose of fluconazole 100 mg tablet.  - H. Danis

## 2024-03-21 NOTE — Telephone Encounter (Signed)
 Relayed message to patient. Verbalized understanding. Instructed to call clinic back if problems persist.

## 2024-04-05 ENCOUNTER — Ambulatory Visit: Admitting: Podiatry

## 2024-04-05 DIAGNOSIS — Z961 Presence of intraocular lens: Secondary | ICD-10-CM | POA: Diagnosis not present

## 2024-04-05 DIAGNOSIS — H25811 Combined forms of age-related cataract, right eye: Secondary | ICD-10-CM | POA: Diagnosis not present

## 2024-04-05 DIAGNOSIS — H2512 Age-related nuclear cataract, left eye: Secondary | ICD-10-CM | POA: Diagnosis not present

## 2024-04-10 ENCOUNTER — Telehealth: Payer: Self-pay

## 2024-04-10 DIAGNOSIS — A048 Other specified bacterial intestinal infections: Secondary | ICD-10-CM

## 2024-04-10 NOTE — Telephone Encounter (Signed)
  Information sent to the pt My Chart and mailed to home address    This is a friendly reminder that you are due for a H. Pylori breath test at this time. This test will help us  determine if the H. Pylori infection has been eradicated. You will need to complete this test 3-4 weeks after completing treatment. You will need to hold PPI (Omeprazole , Pantoprazole, Esomeprazole, etc.) for 7 days prior to submitting the test.   Attached is the [facility] office information. You may go online to schedule an appointment or just walk-in.   If you have any questions or concerns please give us  a call at [phone].  Sincerely,   [facility]    [facility] PHONE NUMBER: [phone]

## 2024-04-10 NOTE — Telephone Encounter (Signed)
-----   Message from Nurse [name] sent at 03/12/2024  4:07 PM EDT ----- Pt needs urea breath test around 04/18/24; place orders and call patient; [name] pt

## 2024-04-12 ENCOUNTER — Ambulatory Visit: Admitting: Podiatry

## 2024-04-16 NOTE — Telephone Encounter (Signed)
 PT is calling to speak with someone about her getting an appointment with [facility]. I told her she needs to go online but she stated she's not familiar. Please advise.

## 2024-04-16 NOTE — Telephone Encounter (Signed)
 The pt has been advised that she can just walk in at [facility] She will go this week.

## 2024-04-18 ENCOUNTER — Ambulatory Visit: Admitting: Podiatry

## 2024-04-18 ENCOUNTER — Other Ambulatory Visit (INDEPENDENT_AMBULATORY_CARE_PROVIDER_SITE_OTHER)

## 2024-04-18 DIAGNOSIS — D509 Iron deficiency anemia, unspecified: Secondary | ICD-10-CM

## 2024-04-18 DIAGNOSIS — A048 Other specified bacterial intestinal infections: Secondary | ICD-10-CM

## 2024-04-18 DIAGNOSIS — M79674 Pain in right toe(s): Secondary | ICD-10-CM

## 2024-04-18 DIAGNOSIS — B351 Tinea unguium: Secondary | ICD-10-CM

## 2024-04-18 DIAGNOSIS — M79675 Pain in left toe(s): Secondary | ICD-10-CM

## 2024-04-18 LAB — CBC WITH DIFFERENTIAL/PLATELET
Basophils Absolute: 0.1 K/uL (ref 0.0–0.1)
Basophils Relative: 1.1 % (ref 0.0–3.0)
Eosinophils Absolute: 0.1 K/uL (ref 0.0–0.7)
Eosinophils Relative: 1.4 % (ref 0.0–5.0)
HCT: 35.8 % — ABNORMAL LOW (ref 36.0–46.0)
Hemoglobin: 12 g/dL (ref 12.0–15.0)
Lymphocytes Relative: 21.3 % (ref 12.0–46.0)
Lymphs Abs: 1 K/uL (ref 0.7–4.0)
MCHC: 33.5 g/dL (ref 30.0–36.0)
MCV: 92.8 fl (ref 78.0–100.0)
Monocytes Absolute: 0.4 K/uL (ref 0.1–1.0)
Monocytes Relative: 9.3 % (ref 3.0–12.0)
Neutro Abs: 3.1 K/uL (ref 1.4–7.7)
Neutrophils Relative %: 66.9 % (ref 43.0–77.0)
Platelets: 210 K/uL (ref 150.0–400.0)
RBC: 3.86 Mil/uL — ABNORMAL LOW (ref 3.87–5.11)
RDW: 14.8 % (ref 11.5–15.5)
WBC: 4.6 K/uL (ref 4.0–10.5)

## 2024-04-18 NOTE — Progress Notes (Signed)
This patient returns to the office for evaluation and treatment of long thick painful nails .  This patient is unable to trim her own nails since the patient cannot reach her feet.  Patient says the nails are painful walking and wearing his shoes.  She returns for preventive foot care services.  General Appearance  Alert, conversant and in no acute stress.  Vascular  Dorsalis pedis and posterior tibial  pulses are palpable  bilaterally.  Capillary return is within normal limits  bilaterally. Temperature is within normal limits  bilaterally.  Neurologic  Senn-Weinstein monofilament wire test within normal limits  bilaterally. Muscle power within normal limits bilaterally.  Nails Thick disfigured discolored nails with subungual debris  from hallux to fifth toes bilaterally. No evidence of bacterial infection or drainage bilaterally.  Orthopedic  No limitations of motion  feet .  No crepitus or effusions noted.  No bony pathology or digital deformities noted.  HAV  B/L.  Skin  normotropic skin with no porokeratosis noted bilaterally.  No signs of infections or ulcers noted.   Asymptomatic callus sub 5th  metabase right foot.  Onychomycosis  Pain in toes right foot  Pain in toes left foot  Debridement  of nails  1-5  B/L with a nail nipper.  Nails were then filed using a dremel tool with no incidents.    RTC 3 months    Renarda Mullinix DPM  

## 2024-04-19 LAB — H. PYLORI BREATH TEST: H. pylori Breath Test: NOT DETECTED

## 2024-04-19 LAB — IRON,TIBC AND FERRITIN PANEL
%SAT: 30 % (ref 16–45)
Ferritin: 48 ng/mL (ref 16–288)
Iron: 91 ug/dL (ref 45–160)
TIBC: 299 ug/dL (ref 250–450)

## 2024-04-22 ENCOUNTER — Ambulatory Visit: Payer: Self-pay | Admitting: Gastroenterology

## 2024-05-23 ENCOUNTER — Ambulatory Visit: Admitting: Gastroenterology

## 2024-05-23 NOTE — Progress Notes (Deleted)
 [facility] Progress Note  Chief Complaint: ***  Summary of GI history:  IBS with bowel habits bloating and flatulence.  Clinical details outlined in 01/29/2024 APP office note.   SIBO testing previously offered, patient decided not to pursue that since her symptoms have reportedly improved Iron -deficiency anemia (details in May 2025 colonoscopy report) -normalized after iron  treatment but patient had to stop it due to digestive upset EGD and colonoscopy May 2025: Mucosal plaques in the esophagus, though biopsies with PAS stain negative for Candida.    Mild gastritis, H. pylori positive treated with bismuth  based quadruple therapy -negative posttreatment urea breath test 04/18/2024 Duodenal biopsies negative for sprue, colon biopsies negative for microscopic colitis, diminutive tubular adenoma removed (no recall due to age), small internal hemorrhoids seen Canceled 05/23/24 visit same day b/c car trouble  Subjective  HPI:  ***  ROS: Cardiovascular:  no chest pain Respiratory: no dyspnea  The patient's Past Medical, Family and Social History were reviewed and are on file in the EMR. Past Medical History:  Diagnosis Date   Allergy    Arthritis    Asthma    mild   Blood transfusion without reported diagnosis    H/O bronchitis    Hemorrhoid    Hx of blood clots 1967   from birth control in [place]   Hypertension    Hypoglycemia    Hypothyroidism    Migraine    from food   Osteopenia 07/04/2019   Pneumonia    hx of   Seasonal allergies    Sleep apnea    CPAP   UTI (urinary tract infection)     Past Surgical History:  Procedure Laterality Date   CATARACT EXTRACTION     rt eye   COLONOSCOPY  05/13/2008   [facility]   detatched retina  2003   rt eye   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   Procedure: RIGHT TOTAL HIP ARTHROPLASTY;  Surgeon: Tanda DELENA Heading, MD;  Location: [facility];  Service: Orthopedics;  Laterality: Right;    Objective:  Med list  reviewed  Current Outpatient Medications:    acetaminophen  (TYLENOL ) 500 MG tablet, Take 500-1,000 mg by mouth as needed for mild pain or moderate pain., Disp: , Rfl:    albuterol  (PROAIR  HFA) 108 (90 Base) MCG/ACT inhaler, Inhale 2 puffs into the lungs every 6 (six) hours as needed for wheezing or shortness of breath., Disp: 1 each, Rfl: 0   Cholecalciferol (VITAMIN D ) 2000 UNITS CAPS, Take 2,000 Units by mouth daily., Disp: , Rfl:    fexofenadine (ALLEGRA) 60 MG tablet, Take 60 mg by mouth daily., Disp: , Rfl:    fluticasone  (FLONASE ) 50 MCG/ACT nasal spray, Place 2 sprays into both nostrils daily., Disp: 16 g, Rfl: 6   hyoscyamine  (LEVSIN  SL) 0.125 MG SL tablet, Place 1 tablet (0.125 mg total) under the tongue 2 (two) times daily as needed (for urgency and loose stools). (Patient not taking: Reported on 01/31/2024), Disp: 60 tablet, Rfl: 6   Iron , Ferrous Sulfate , 325 (65 Fe) MG TABS, Take 325 mg by mouth every other day., Disp: 45 tablet, Rfl: 2   levothyroxine  (SYNTHROID ) 50 MCG tablet, Take 1 tablet (50 mcg total) by mouth daily., Disp: 90 tablet, Rfl: 2   lisinopril  (ZESTRIL ) 20 MG tablet, TAKE 1 TABLET BY MOUTH AT BEDTIME, Disp: 90 tablet, Rfl: 3   Menthol , Topical Analgesic, (BIOFREEZE EX), Apply topically., Disp: , Rfl:    Multiple Vitamins-Minerals (MULTIVITAMIN WITH MINERALS) tablet, Take  1 tablet by mouth daily., Disp: , Rfl:    nystatin  (MYCOSTATIN /NYSTOP ) powder, Apply topically 2 (two) times daily as needed. Beneath both breasts, Disp: 15 g, Rfl: 0   omeprazole  (PRILOSEC) 20 MG capsule, Take 1 capsule (20 mg total) by mouth 2 (two) times daily for 14 days., Disp: 28 capsule, Rfl: 0   OXYQUINOLONE SULFATE VAGINAL (TRIMO-SAN) 0.025 % GEL, Use one applicator full vaginally 2 x a week at hs, Disp: 113.4 g, Rfl: 1   triamcinolone  ointment (KENALOG ) 0.1 %, Apply once daily to hands as needed for hand eczema only., Disp: 30 g, Rfl: 2   Vital signs in last 24 hrs: There were no vitals  filed for this visit. Wt Readings from Last 3 Encounters:  03/01/24 175 lb (79.4 kg)  02/13/24 176 lb 6.4 oz (80 kg)  01/31/24 175 lb (79.4 kg)    Physical Exam  *** HEENT: sclera anicteric, oral mucosa moist without lesions Neck: supple, no thyromegaly, JVD or lymphadenopathy Cardiac: ***,  no peripheral edema Pulm: clear to auscultation bilaterally, normal RR and effort noted Abdomen: soft, *** tenderness, with active bowel sounds. No guarding or palpable hepatosplenomegaly. Skin; warm and dry, no jaundice or rash  Labs:   ___________________________________________ Radiologic studies:   ____________________________________________ Other:   _____________________________________________ Assessment & Plan  Assessment: No diagnosis found.    Plan:   *** minutes were spent on this encounter (including chart review, history/exam, counseling/coordination of care, and documentation) > 50% of that time was spent on counseling and coordination of care.   [name]

## 2024-05-29 ENCOUNTER — Other Ambulatory Visit (HOSPITAL_COMMUNITY)
Admission: RE | Admit: 2024-05-29 | Discharge: 2024-05-29 | Disposition: A | Discharge status: Home / Self Care | Attending: Obstetrics | Admitting: Obstetrics

## 2024-05-29 ENCOUNTER — Ambulatory Visit: Payer: Self-pay | Admitting: Obstetrics

## 2024-05-29 ENCOUNTER — Ambulatory Visit: Admitting: Obstetrics

## 2024-05-29 ENCOUNTER — Encounter: Payer: Self-pay | Admitting: Obstetrics

## 2024-05-29 VITALS — BP 164/77 | HR 55 | Ht 62.21 in | Wt 173.4 lb

## 2024-05-29 DIAGNOSIS — Z6831 Body mass index (BMI) 31.0-31.9, adult: Secondary | ICD-10-CM

## 2024-05-29 DIAGNOSIS — N3946 Mixed incontinence: Secondary | ICD-10-CM

## 2024-05-29 DIAGNOSIS — E669 Obesity, unspecified: Secondary | ICD-10-CM | POA: Diagnosis not present

## 2024-05-29 DIAGNOSIS — R8290 Unspecified abnormal findings in urine: Secondary | ICD-10-CM | POA: Diagnosis not present

## 2024-05-29 DIAGNOSIS — K5900 Constipation, unspecified: Secondary | ICD-10-CM

## 2024-05-29 DIAGNOSIS — N814 Uterovaginal prolapse, unspecified: Secondary | ICD-10-CM | POA: Diagnosis not present

## 2024-05-29 DIAGNOSIS — N952 Postmenopausal atrophic vaginitis: Secondary | ICD-10-CM | POA: Diagnosis not present

## 2024-05-29 DIAGNOSIS — Z4689 Encounter for fitting and adjustment of other specified devices: Secondary | ICD-10-CM | POA: Diagnosis not present

## 2024-05-29 LAB — URINALYSIS, COMPLETE (UACMP) WITH MICROSCOPIC
Bacteria, UA: NONE SEEN
Bilirubin Urine: NEGATIVE
Glucose, UA: NEGATIVE mg/dL
Hgb urine dipstick: NEGATIVE
Ketones, ur: NEGATIVE mg/dL
Leukocytes,Ua: NEGATIVE
Nitrite: NEGATIVE
Protein, ur: NEGATIVE mg/dL
Specific Gravity, Urine: 1.009 (ref 1.005–1.030)
pH: 5 (ref 5.0–8.0)

## 2024-05-29 LAB — POCT URINALYSIS DIP (CLINITEK)
Bilirubin, UA: NEGATIVE
Glucose, UA: NEGATIVE mg/dL
Ketones, POC UA: NEGATIVE mg/dL
Nitrite, UA: POSITIVE — AB
POC PROTEIN,UA: NEGATIVE
Spec Grav, UA: 1.02 (ref 1.010–1.025)
Urobilinogen, UA: 0.2 U/dL
pH, UA: 7 (ref 5.0–8.0)

## 2024-05-29 MED ORDER — GEMTESA 75 MG PO TABS
75.0000 mg | ORAL_TABLET | Freq: Every day | ORAL | Status: DC
Start: 2024-05-29 — End: 2024-05-31

## 2024-05-29 MED ORDER — ESTRADIOL 0.1 MG/GM VA CREA: 0.5000 g | TOPICAL_CREAM | VAGINAL | 3 refills | Status: AC

## 2024-05-29 MED ORDER — GEMTESA 75 MG PO TABS: 75.0000 mg | ORAL_TABLET | Freq: Every day | ORAL | 2 refills | Status: DC

## 2024-05-29 NOTE — Assessment & Plan Note (Addendum)
-   POCT clean catch UA leuk/heme. Pending UA microscopy and culture - denies gross hematuria - history of 39 pack years - repeat urine testing at follow-up after vaginal estrogen - For management of microscopic hematuria if testing is positive, will need to assess the upper and lower GU tract with CT urogram and cystoscopy.  - Cr 0.86 in 12/26/23

## 2024-05-29 NOTE — Assessment & Plan Note (Addendum)
-   For symptomatic vaginal atrophy options include lubrication with a water-based lubricant, personal hygiene measures and barrier protection against wetness, and estrogen replacement in the form of vaginal cream, vaginal tablets, or a time-released vaginal ring.   - Rx low dose vaginal estrogen to improvement comfort during pessary management, previously avoided due to history of upper extremity blood clot - We discussed the potential risks associated with hormone replacement including stroke, heart attack, and blood clots; and the fact that these risks are very low with vaginal estrogen use due to the very low systemic absorption rate of ~ 0.01% with a twice-week regimen.

## 2024-05-29 NOTE — Assessment & Plan Note (Addendum)
-   history of constipation with internal hemorrhoids pending follow-up with GI due to blood when she wipes after bowel movements - For constipation, we reviewed the importance of a better bowel regimen.  We also discussed the importance of avoiding chronic straining, as it can exacerbate her pelvic floor symptoms; we discussed treating constipation and straining prior to surgery, as postoperative straining can lead to damage to the repair and recurrence of symptoms. We discussed initiating therapy with increasing fluid intake, fiber supplementation, stool softeners, and laxatives such as miralax .  - discussed association with pelvic floor disorders and urinary leakage - encouraged fiber supplementation, squatting position, and splinting for bowel movements to reduce straining.

## 2024-05-29 NOTE — Assessment & Plan Note (Signed)
-   pessary use since 2022 without self management - discussed risk of change in urinary or bowel symptoms, vaginal ulceration, discharge, bleeding, fistula formation. Explained that pt may require multiple sizes and types for fitting.  - replaced size #4 ring with support pessary and discussed importance of pessary follow-up every 3 months - encouraged to attempt teaching for self management at follow-up and refitting due to stress urinary leakage

## 2024-05-29 NOTE — Assessment & Plan Note (Addendum)
-   encouraged weight reduction due to association with pelvic floor disorders

## 2024-05-29 NOTE — Assessment & Plan Note (Addendum)
-   self management of size #4 ring with support pessary last seen by Dr. Jertson in 2022 - For treatment of pelvic organ prolapse, we discussed options for management including expectant management, conservative management, and surgical management, such as Kegels, a pessary, pelvic floor physical therapy, and specific surgical procedures. - encouraged to continue pessary use and return to office for pessary maintenance - consider refitting for incontinence pessary and self management teaching - pt desires to avoid surgical intervention

## 2024-05-29 NOTE — Progress Notes (Signed)
 New Patient Evaluation and Consultation  Referring Provider: Berneta Elsie Sayre,* PCP: Berneta Elsie Sayre, MD Date of Service: 05/29/2024  SUBJECTIVE Chief Complaint: New Patient (Initial Visit) [name] is a 78 y.o. female here today for uterine prolapse and urgency urinary incontinence.  )  History of Present Illness: [name] is a 78 y.o. White or Caucasian female seen in consultation at the request of Dr Berneta for evaluation of uterine prolapse and urgency urinary incontinence.    Cherry tomato size vaginal bulge started after 1st pregnancy, reports uterus flipping resulted in difficulty with tampon placement. Started intermittent pessary use around mid 1970s after 1st pregnancy, placed prior to 2nd and 3rd pregnancies with removal around 40 weeks in 1977. Symptoms improved due to lack of tampon use after 78yo during menopause.  Urinary symptoms started around 5 years ago with dribbling with urine stream. Tried pelvic floor exercises with kegels with some relief initially. Presented for evaluation 01/2021 due to uterine prolapse with golfball size bulge,  size #4 ring with support pessary placed for uterine prolapse and urinary leakage managed by 4-5 pads/day. Leakage improved until it worsened around 2024. Continues pessary use in the past 3 years without exams, difficulty with replacement and has not removed since 2022.  Denies vaginal discharge or bleeding.   Leaks when she gets up out of bed in the morning with sensation of incomplete emptying when she gets to the toilet Per chart review 05/14/21 visit noted mild erythema in the posterior upper vagina. Recommended pessary removal 2x/week and trimosan use. Avoided vaginal estrogen due to h/o upper extremity blod clot   Review of records significant for: H/o iron  supplementation for anemia discontinued in 2024 due to constipation, hypothyroidism, history of blood clot in 1967 from contraception in LUE,  pre-DM  Urinary Symptoms: Leaks urine with cough/ sneeze, going from sitting to standing, with movement to the bathroom, and running water Most bothered by running water with urgency Leaks 3-4 time(s) per days with urgency or prolonged car rides Leaks 1-2x/week with cough/sneeze Denies leakage at night Pad use: 3-4 pads per day.   Patient is bothered by UI symptoms.  Day time voids 5-6.  Nocturia: 0-1 times per night to void with bilateral lower extremity edema and OSA On CPAP Voiding dysfunction:  empties bladder well.  Patient does not use a catheter to empty bladder.  When urinating, patient feels she has no difficulties Drinks: 40oz water per day, 24oz 1/2 caf coffee, 8oz decaf unsweeten ice tea, 8oz milk, rarely sprite soda   UTIs: 0 UTI's in the last year.   Denies history of blood in urine, kidney or bladder stones, pyelonephritis, bladder cancer, and kidney cancer No results found for the last 90 days.  Pelvic Organ Prolapse Symptoms:                  Patient Admits to a feeling of a bulge the vaginal area. It has been present for 50 years.  Patient Denies seeing a bulge with pessary use.  This bulge is bothersome.  Bowel Symptom: Bowel movements: 2 time(s) per day with history of constipation and known internal and external hemorrhoids managed by GI pending intervention Stool consistency: soft  Straining: yes.  Splinting: no.  Incomplete evacuation: no.  Patient Denies accidental bowel leakage / fecal incontinence Bowel regimen: none Last colonoscopy: Date polypectomy and internal hemorrhoids, Results tubular adenoma pending follow-up [facility] Colonoscopy          Completed or No Longer  Recommended     Colonoscopy  Discontinued      Frequency changed to Never automatically (Topic No Longer Applies)   03/01/2024  COLONOSCOPY   Only the first 1 history entries have been loaded, but more history exists.                Sexual Function Sexually active: no.   Sexual orientation: Straight Pain with sex: No  Pelvic Pain Denies pelvic pain   Past Medical History:  Past Medical History:  Diagnosis Date   Allergy    Arthritis    Asthma    mild   Blood transfusion without reported diagnosis    H/O bronchitis    Hemorrhoid    Hx of blood clots 1967   from birth control in LUE   Hypertension    Hypoglycemia    Hypothyroidism    Migraine    from food   Osteopenia 07/04/2019   Pneumonia    hx of   Seasonal allergies    Sleep apnea    CPAP   UTI (urinary tract infection)      Past Surgical History:   Past Surgical History:  Procedure Laterality Date   CATARACT EXTRACTION Bilateral    rt eye   COLONOSCOPY  05/13/2008   [facility]   COLONOSCOPY     detatched retina  2003   rt eye   ESOPHAGOGASTRODUODENOSCOPY     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 Forceps/ Vacuum deliveries: 0, Cesarean section: 0 Menopausal: Yes, at age 15, Denies vaginal bleeding since menopause Contraception: s/p menopause. Last pap smear.  Any history of abnormal pap smears: no.    Component Value Date/Time   DIAGPAP  11/30/2016 0000    NEGATIVE FOR INTRAEPITHELIAL LESIONS OR MALIGNANCY.   ADEQPAP  11/30/2016 0000    Satisfactory for evaluation. The presence or absence of an endocervical / transformation zone component cannot be determined because of atrophy.    Medications: Patient has a current medication list which includes the following  prescription(s): acetaminophen , albuterol , vitamin d , [START ON 05/31/2024] estradiol , fexofenadine, fluticasone , levothyroxine , lisinopril , menthol  (topical analgesic), multivitamin with minerals, nystatin , triamcinolone  ointment, gemtesa , gemtesa , hyoscyamine , iron  (ferrous sulfate ), omeprazole , and trimo-san.   Allergies: Patient is allergic to codeine, hydrocodone, pork-derived products, and prednisone .   Social History:  Social History   Tobacco Use   Smoking status: Former    Current packs/day: 0.00    Average packs/day: 3.0 packs/day for 13.0 years (39.0 ttl pk-yrs)    Types: Cigarettes    Start date: 10/11/1961    Quit date: 10/11/1974    Years since quitting: 49.6    Passive exposure: Never   Smokeless tobacco: Never  Vaping Use   Vaping status: Never Used  Substance Use Topics   Alcohol  use: No    Alcohol/week: 0.0 standard drinks of alcohol   Drug use: No    Relationship status: single Patient lives by herself.   Patient is not employed. Regular exercise: Yes: walking History of abuse: No  Family History:   Family History  Problem Relation Age of Onset   Thyroid  disease Mother    Osteoarthritis Mother    Thyroid  disease Father    Osteoarthritis Father    Osteoarthritis Maternal Grandmother    Colon cancer Maternal Grandmother 62   Osteoarthritis Maternal Grandfather    Osteoarthritis Paternal Grandmother    Esophageal cancer Neg Hx    Rectal cancer Neg Hx    Stomach cancer Neg Hx    Colon polyps Neg Hx    Uterine cancer Neg Hx    Renal cancer Neg Hx    Bladder Cancer Neg Hx      Review of Systems: Review of Systems  Constitutional:  Positive for malaise/fatigue. Negative for fever and weight loss.  Respiratory:  Negative for cough, shortness of breath and wheezing.   Cardiovascular:  Positive for leg swelling. Negative for chest pain and palpitations.  Gastrointestinal:  Negative for abdominal pain and blood in stool.  Genitourinary:  Negative for  hematuria.  Skin:  Negative for rash.  Neurological:  Positive for dizziness, weakness and headaches.  Endo/Heme/Allergies:  Bruises/bleeds easily.  Psychiatric/Behavioral:  Negative for depression. The patient is not nervous/anxious.      OBJECTIVE Physical Exam: Vitals:   05/29/24 1056 05/29/24 1305  BP: (!) 161/76 (!) 164/77  Pulse: (!) 54 (!) 55  Weight: 173 lb 6.4 oz (78.7 kg)   Height: 5' 2.21 (1.58 m)    Physical Exam Constitutional:      General: She is not in acute distress.    Appearance: Normal appearance.  Genitourinary:     Bladder and urethral meatus normal.     No lesions in the vagina.     Genitourinary Comments: Size 4 ring with pessary in place, no descent with valsalva. Removed with some discomfort due to vaginal atrophy and small genital hiatus. Pessary replaced without difficulty.     Right Labia: No rash, tenderness, lesions, skin changes or Bartholin's cyst.    Left Labia: No tenderness, lesions, skin changes, Bartholin's cyst or rash.    No vaginal discharge, erythema, tenderness, bleeding, ulceration or granulation tissue.     Severe vaginal atrophy present.     Right Adnexa: not tender, not full and no mass present.    Left Adnexa: not tender, not full and no mass present.    No cervical motion tenderness, discharge, friability, lesion, polyp or nabothian cyst.     Uterus is not enlarged, fixed, tender, irregular or prolapsed.     No uterine mass detected.    Urethral meatus caruncle not present.    No urethral prolapse, tenderness, mass, hypermobility, discharge or stress urinary incontinence with cough stress test present.     Bladder is not tender, urgency on palpation not present and masses not present.      Levator ani not tender, obturator internus not tender, no asymmetrical contractions present and no pelvic spasms present.    Symmetrical pelvic sensation, anal wink present and BC reflex present. Cardiovascular:     Rate and Rhythm: Normal  rate.  Pulmonary:     Effort: Pulmonary effort is normal. No respiratory distress.  Abdominal:     General: There is no distension.     Palpations: Abdomen is soft. There  is no mass.     Tenderness: There is no abdominal tenderness.     Hernia: No hernia is present.  Neurological:     Mental Status: She is alert.  Vitals reviewed. Exam conducted with a chaperone present.   POP-Q:   POP-Q  -3                                            Aa   -3                                           Ba  -7                                              C   1                                            Gh  3                                            Pb  9                                            tvl   -3                                            Ap  -3                                            Bp  -8                                              D     Post-Void Residual (PVR) by Bladder Scan: In order to evaluate bladder emptying, we discussed obtaining a postvoid residual and patient agreed to this procedure.  Procedure: The ultrasound unit was placed on the patient's abdomen in the suprapubic region after the patient had voided.    Post Void Residual - 05/29/24 1121       Post Void Residual   Post Void Residual 2 mL           Laboratory Results: Lab Results  Component Value Date   COLORU yellow 05/29/2024   CLARITYU clear 05/29/2024   GLUCOSEUR negative 05/29/2024   BILIRUBINUR negative 05/29/2024   KETONESU neg 07/20/2011   SPECGRAV 1.020 05/29/2024   RBCUR trace-intact (A) 05/29/2024   PHUR 7.0 05/29/2024  PROTEINUR NEGATIVE 09/11/2013   UROBILINOGEN 0.2 05/29/2024   LEUKOCYTESUR Small (1+) (A) 05/29/2024    Lab Results  Component Value Date   CREATININE 0.86 12/26/2023   CREATININE 0.72 06/30/2023   CREATININE 0.84 03/30/2023    Lab Results  Component Value Date   HGBA1C 5.8 12/26/2023    Lab Results  Component Value Date   HGB 12.0 04/18/2024      ASSESSMENT AND PLAN Ms. Kondo is a 78 y.o. with:  1. Uterine prolapse   2. Constipation, unspecified constipation type   3. Mixed stress and urge urinary incontinence   4. Obesity (BMI 30-39.9)   5. Vaginal atrophy   6. Pessary maintenance   7. Abnormal urinalysis     Uterine prolapse Assessment & Plan: - self management of size #4 ring with support pessary last seen by Dr. Jertson in 2022 - For treatment of pelvic organ prolapse, we discussed options for management including expectant management, conservative management, and surgical management, such as Kegels, a pessary, pelvic floor physical therapy, and specific surgical procedures. - encouraged to continue pessary use and return to office for pessary maintenance - consider refitting for incontinence pessary and self management teaching - pt desires to avoid surgical intervention   Constipation, unspecified constipation type Assessment & Plan: - history of constipation with internal hemorrhoids pending follow-up with GI due to blood when she wipes after bowel movements - For constipation, we reviewed the importance of a better bowel regimen.  We also discussed the importance of avoiding chronic straining, as it can exacerbate her pelvic floor symptoms; we discussed treating constipation and straining prior to surgery, as postoperative straining can lead to damage to the repair and recurrence of symptoms. We discussed initiating therapy with increasing fluid intake, fiber supplementation, stool softeners, and laxatives such as miralax .  - discussed association with pelvic floor disorders and urinary leakage - encouraged fiber supplementation, squatting position, and splinting for bowel movements to reduce straining.   Mixed stress and urge urinary incontinence Assessment & Plan: - PVR 2mL with pessary in place - We discussed the symptoms of overactive bladder (OAB), which include urinary urgency, urinary frequency, nocturia,  with or without urge incontinence.  While we do not know the exact etiology of OAB, several treatment options exist. We discussed management including behavioral therapy (decreasing bladder irritants, urge suppression strategies, timed voids, bladder retraining), physical therapy, medication; for refractory cases posterior tibial nerve stimulation, sacral neuromodulation, and intravesical botulinum toxin injection.  For anticholinergic medications, we discussed the potential side effects of anticholinergics including dry eyes, dry mouth, constipation, cognitive impairment and urinary retention. For Beta-3 agonist medication, we discussed the potential side effect of elevated blood pressure which is more likely to occur in individuals with uncontrolled hypertension. - samples and Rx for trial of Gemtesa . Rx Trospium  if cost prohibitive - encouraged fluid management and caffeine reduction - start low dose vaginal estrogen - For treatment of stress urinary incontinence,  non-surgical options include expectant management, weight loss, physical therapy, as well as a pessary.  Surgical options include a midurethral sling, Burch urethropexy, and transurethral injection of a bulking agent. - optimize stool consistency  Orders: -     POCT URINALYSIS DIP (CLINITEK) -     Gemtesa ; Take 1 tablet (75 mg total) by mouth daily. LOT: [id number] EXP: 10/2027  Dispense: 28 tablet -     Gemtesa ; Take 1 tablet (75 mg total) by mouth daily.  Dispense: 30 tablet; Refill: 2  Obesity (BMI 30-39.9) Assessment &  Plan: - encouraged weight reduction due to association with pelvic floor disorders   Vaginal atrophy Assessment & Plan: - For symptomatic vaginal atrophy options include lubrication with a water-based lubricant, personal hygiene measures and barrier protection against wetness, and estrogen replacement in the form of vaginal cream, vaginal tablets, or a time-released vaginal ring.   - Rx low dose vaginal estrogen to  improvement comfort during pessary management, previously avoided due to history of upper extremity blood clot - We discussed the potential risks associated with hormone replacement including stroke, heart attack, and blood clots; and the fact that these risks are very low with vaginal estrogen use due to the very low systemic absorption rate of ~ 0.01% with a twice-week regimen.  Orders: -     Estradiol ; Place 0.5 g vaginally 2 (two) times a week. Place 0.5g nightly for two weeks then twice a week after  Dispense: 30 g; Refill: 3  Pessary maintenance Assessment & Plan: - pessary use since 2022 without self management - discussed risk of change in urinary or bowel symptoms, vaginal ulceration, discharge, bleeding, fistula formation. Explained that pt may require multiple sizes and types for fitting.  - replaced size #4 ring with support pessary and discussed importance of pessary follow-up every 3 months - encouraged to attempt teaching for self management at follow-up and refitting due to stress urinary leakage   Abnormal urinalysis Assessment & Plan: - POCT clean catch UA leuk/heme. Pending UA microscopy and culture - denies gross hematuria - history of 39 pack years - repeat urine testing at follow-up after vaginal estrogen - For management of microscopic hematuria if testing is positive, will need to assess the upper and lower GU tract with CT urogram and cystoscopy.  - Cr 0.86 in 12/26/23   Orders: -     Urine Microscopic; Future  Time spent: I spent 71 minutes dedicated to the care of this patient on the date of this encounter to include pre-visit review of records, face-to-face time with the patient discussing uterine prolapse, mixed urinary incontinence, constipation, pessary maintenance, abnormal urinalysis, vaginal atrophy, BMI, and post visit documentation and ordering medication/ testing.   Lianne ONEIDA Gillis, MD

## 2024-05-29 NOTE — Assessment & Plan Note (Addendum)
-   PVR 2mL with pessary in place - We discussed the symptoms of overactive bladder (OAB), which include urinary urgency, urinary frequency, nocturia, with or without urge incontinence.  While we do not know the exact etiology of OAB, several treatment options exist. We discussed management including behavioral therapy (decreasing bladder irritants, urge suppression strategies, timed voids, bladder retraining), physical therapy, medication; for refractory cases posterior tibial nerve stimulation, sacral neuromodulation, and intravesical botulinum toxin injection.  For anticholinergic medications, we discussed the potential side effects of anticholinergics including dry eyes, dry mouth, constipation, cognitive impairment and urinary retention. For Beta-3 agonist medication, we discussed the potential side effect of elevated blood pressure which is more likely to occur in individuals with uncontrolled hypertension. - samples and Rx for trial of Gemtesa . Rx Trospium  if cost prohibitive - encouraged fluid management and caffeine reduction - start low dose vaginal estrogen - For treatment of stress urinary incontinence,  non-surgical options include expectant management, weight loss, physical therapy, as well as a pessary.  Surgical options include a midurethral sling, Burch urethropexy, and transurethral injection of a bulking agent. - optimize stool consistency

## 2024-05-29 NOTE — Patient Instructions (Addendum)
 You have a stage 3 (out of 4) prolapse.  We discussed the fact that it is not life threatening but there are several treatment options. For treatment of pelvic organ prolapse, we discussed options for management including expectant management, conservative management, and surgical management, such as Kegels, a pessary, pelvic floor physical therapy, and specific surgical procedures.     You are opting to continue pessary use. This will keep the bulge inside and prevent it from getting worse. You can learn how to remove it yourself or we can do that for you every 3 months.  - discussed risk of change in urinary or bowel symptoms, vaginal ulceration, discharge, bleeding, fistula formation.   We discussed the symptoms of overactive bladder (OAB), which include urinary urgency, urinary frequency, night-time urination, with or without urge incontinence.  We discussed management including behavioral therapy (decreasing bladder irritants by following a bladder diet, urge suppression strategies, timed voids, bladder retraining), physical therapy, medication; and for refractory cases posterior tibial nerve stimulation, sacral neuromodulation, and intravesical botulinum toxin injection.   For Beta-3 agonist medication, we discussed the potential side effect of elevated blood pressure which is more likely to occur in individuals with uncontrolled hypertension. You were given samples for Gemtesa  75 mg.  It can take a month to start working so give it time, but if you have bothersome side effects call sooner and we can try a different medication.  Call us  if you have trouble filling the prescription or if it's not covered by your insurance.  For vaginal atrophy (thinning of the vaginal tissue that can cause dryness and burning) and UTI prevention we discussed estrogen replacement in the form of vaginal cream.   Start vaginal estrogen therapy nightly for two weeks then 2 times weekly at night. This can be placed with  your finger or an applicator inside the vagina and around the urethra.  Please let us  know if the prescription is too expensive and we can look for alternative options.   Is vaginal estrogen therapy safe for me? Vaginal estrogen preparations act on the vaginal skin, and only a very tiny amount is absorbed into the bloodstream (0.01%).  They work in a similar way to hand or face cream.  There is minimal absorption and they are therefore perfectly safe. If you have had breast cancer and have persistent troublesome symptoms which aren't settling with vaginal moisturisers and lubricants, local estrogen treatment may be a possibility, but consultation with your oncologist should take place first.   Constipation: Our goal is to achieve formed bowel movements daily or every-other-day.  You may need to try different combinations of the following options to find what works best for you - everybody's body works differently so feel free to adjust the dosages as needed.  Some options to help maintain bowel health include:  Dietary changes (more leafy greens, vegetables and fruits; less processed foods) Fiber supplementation (Benefiber, FiberCon, Metamucil or Psyllium). Start slow and increase gradually to full dose. Over-the-counter agents such as: stool softeners (Docusate or Colace) and/or laxatives (Miralax , milk of magnesia)  Power Pudding is a natural mixture that may help your constipation.  To make blend 1 cup applesauce, 1 cup wheat bran, and 3/4 cup prune juice, refrigerate and then take 1 tablespoon daily with a large glass of water as needed.   Women should try to eat at least 21 to 25 grams of fiber a day, while men should aim for 30 to 38 grams a day. You can add  fiber to your diet with food or a fiber supplement such as psyllium (metamucil), benefiber, or fibercon.   Here's a look at how much dietary fiber is found in some common foods. When buying packaged foods, check the Nutrition Facts label  for fiber content. It can vary among brands.  Fruits Serving size Total fiber (grams)*  Raspberries 1 cup 8.0  Pear 1 medium 5.5  Apple, with skin 1 medium 4.5  Banana 1 medium 3.0  Orange 1 medium 3.0  Strawberries 1 cup 3.0   Vegetables Serving size Total fiber (grams)*  Green peas, boiled 1 cup 9.0  Broccoli, boiled 1 cup chopped 5.0  Turnip greens, boiled 1 cup 5.0  Brussels sprouts, boiled 1 cup 4.0  Potato, with skin, baked 1 medium 4.0  Sweet corn, boiled 1 cup 3.5  Cauliflower, raw 1 cup chopped 2.0  Carrot, raw 1 medium 1.5   Grains Serving size Total fiber (grams)*  Spaghetti, whole-wheat, cooked 1 cup 6.0  Barley, pearled, cooked 1 cup 6.0  Bran flakes 3/4 cup 5.5  Quinoa, cooked 1 cup 5.0  Oat bran muffin 1 medium 5.0  Oatmeal, instant, cooked 1 cup 5.0  Popcorn, air-popped 3 cups 3.5  Brown rice, cooked 1 cup 3.5  Bread, whole-wheat 1 slice 2.0  Bread, rye 1 slice 2.0   Legumes, nuts and seeds Serving size Total fiber (grams)*  Split peas, boiled 1 cup 16.0  Lentils, boiled 1 cup 15.5  Black beans, boiled 1 cup 15.0  Baked beans, canned 1 cup 10.0  Chia seeds 1 ounce 10.0  Almonds 1 ounce (23 nuts) 3.5  Pistachios 1 ounce (49 nuts) 3.0  Sunflower kernels 1 ounce 3.0  *Rounded to nearest 0.5 gram. Source: [employer] for [employer], [employer]	[place]

## 2024-05-30 ENCOUNTER — Telehealth: Payer: Self-pay

## 2024-05-30 DIAGNOSIS — N3946 Mixed incontinence: Secondary | ICD-10-CM

## 2024-05-30 NOTE — Telephone Encounter (Signed)
 [place]  cost for is too expensive. She cannot afford the $177 a month. In your office notes your alternative was Trospium . But not sure on doing and quantity.

## 2024-05-31 MED ORDER — TROSPIUM CHLORIDE ER 60 MG PO CP24: 1.0000 | ORAL_CAPSULE | Freq: Every day | ORAL | 2 refills | Status: DC

## 2024-05-31 NOTE — Telephone Encounter (Signed)
 Rx changed to Trospium  due to cost.  If Trospium  is cost prohibitive from her pharmacy. Please visit the website below to sign up for an account. We will need an email address to send along with her prescription to verify her prescription once she signs up.  [email or web address]  Trospium  Chloride ER Capsule Extended Release  60mg   30 count $31.11  Or    Trospium  Chloride (2 times a day dosing) Tablet  20mg   60 count  $14.03

## 2024-06-01 NOTE — Telephone Encounter (Signed)
 Left message to return call

## 2024-06-05 NOTE — Telephone Encounter (Signed)
 Left message to return call

## 2024-06-07 NOTE — Telephone Encounter (Signed)
 Left message to return call

## 2024-06-13 NOTE — Telephone Encounter (Signed)
 Patient picked up her medications. And is happy with her copay cost. If she changes her mind she will reach out.

## 2024-06-17 ENCOUNTER — Other Ambulatory Visit: Payer: Self-pay | Admitting: Family Medicine

## 2024-06-17 DIAGNOSIS — E038 Other specified hypothyroidism: Secondary | ICD-10-CM

## 2024-06-17 DIAGNOSIS — I10 Essential (primary) hypertension: Secondary | ICD-10-CM

## 2024-06-26 ENCOUNTER — Ambulatory Visit (INDEPENDENT_AMBULATORY_CARE_PROVIDER_SITE_OTHER): Admitting: Family Medicine

## 2024-06-26 ENCOUNTER — Encounter: Payer: Self-pay | Admitting: Family Medicine

## 2024-06-26 VITALS — BP 136/70 | HR 49 | Temp 96.9°F | Ht 62.0 in | Wt 175.8 lb

## 2024-06-26 DIAGNOSIS — E559 Vitamin D deficiency, unspecified: Secondary | ICD-10-CM | POA: Diagnosis not present

## 2024-06-26 DIAGNOSIS — E039 Hypothyroidism, unspecified: Secondary | ICD-10-CM | POA: Diagnosis not present

## 2024-06-26 DIAGNOSIS — Z23 Encounter for immunization: Secondary | ICD-10-CM | POA: Diagnosis not present

## 2024-06-26 DIAGNOSIS — R7303 Prediabetes: Secondary | ICD-10-CM | POA: Diagnosis not present

## 2024-06-26 DIAGNOSIS — E538 Deficiency of other specified B group vitamins: Secondary | ICD-10-CM | POA: Diagnosis not present

## 2024-06-26 DIAGNOSIS — I10 Essential (primary) hypertension: Secondary | ICD-10-CM

## 2024-06-26 DIAGNOSIS — E611 Iron deficiency: Secondary | ICD-10-CM

## 2024-06-26 LAB — BASIC METABOLIC PANEL WITH GFR
BUN: 19 mg/dL (ref 6–23)
CO2: 28 meq/L (ref 19–32)
Calcium: 9.6 mg/dL (ref 8.4–10.5)
Chloride: 108 meq/L (ref 96–112)
Creatinine, Ser: 0.85 mg/dL (ref 0.40–1.20)
GFR: 65.22 mL/min (ref >60.00)
Glucose, Bld: 84 mg/dL (ref 70–99)
Potassium: 4.3 meq/L (ref 3.5–5.1)
Sodium: 136 meq/L (ref 135–145)

## 2024-06-26 LAB — VITAMIN B12: Vitamin B-12: 320 pg/mL (ref 211–911)

## 2024-06-26 LAB — CBC
HCT: 35.2 % — ABNORMAL LOW (ref 36.0–46.0)
Hemoglobin: 11.7 g/dL — ABNORMAL LOW (ref 12.0–15.0)
MCHC: 33.1 g/dL (ref 30.0–36.0)
MCV: 94.8 fl (ref 78.0–100.0)
Platelets: 202 K/uL (ref 150.0–400.0)
RBC: 3.71 Mil/uL — ABNORMAL LOW (ref 3.87–5.11)
RDW: 13.9 % (ref 11.5–15.5)
WBC: 4.8 K/uL (ref 4.0–10.5)

## 2024-06-26 LAB — VITAMIN D 25 HYDROXY (VIT D DEFICIENCY, FRACTURES): VITD: 40.77 ng/mL (ref 30.00–100.00)

## 2024-06-26 LAB — TSH: TSH: 2.71 u[IU]/mL (ref 0.35–5.50)

## 2024-06-26 LAB — HEMOGLOBIN A1C: Hgb A1c MFr Bld: 5.9 % (ref 4.6–6.5)

## 2024-06-26 NOTE — Progress Notes (Signed)
 Established Patient Office Visit   Subjective:  Patient ID: [name], female    DOB: 1946/03/28  Age: 78 y.o. MRN: [id number]  Chief Complaint  Patient presents with   Medical Management of Chronic Issues    6 month follow up. Pt is fasting. Flu shot today.     HPI Encounter Diagnoses  Name Primary?   Iron  deficiency Yes   Hypothyroidism, unspecified type    Prediabetes    Essential hypertension    Immunization due    Vitamin D  deficiency    B12 deficiency    For follow-up of above. No iron  supplements for the last 3 months.  Seen urogynecologist and given pessary for prolapsed uterus and trospium  for irritable bladder.  Symptoms are improve.  Blood pressure controlled well with lisinopril  20.   Review of Systems  Constitutional: Negative.   HENT: Negative.    Eyes:  Negative for blurred vision, discharge and redness.  Respiratory: Negative.    Cardiovascular: Negative.   Gastrointestinal:  Negative for abdominal pain.  Genitourinary: Negative.   Musculoskeletal: Negative.  Negative for myalgias.  Skin:  Negative for rash.  Neurological:  Negative for tingling, loss of consciousness and weakness.  Endo/Heme/Allergies:  Negative for polydipsia.     Current Outpatient Medications:    acetaminophen  (TYLENOL ) 500 MG tablet, Take 500-1,000 mg by mouth as needed for mild pain or moderate pain., Disp: , Rfl:    albuterol  (PROAIR  HFA) 108 (90 Base) MCG/ACT inhaler, Inhale 2 puffs into the lungs every 6 (six) hours as needed for wheezing or shortness of breath., Disp: 1 each, Rfl: 0   Cholecalciferol (VITAMIN D ) 2000 UNITS CAPS, Take 2,000 Units by mouth daily., Disp: , Rfl:    estradiol  (ESTRACE ) 0.1 MG/GM vaginal cream, Place 0.5 g vaginally 2 (two) times a week. Place 0.5g nightly for two weeks then twice a week after, Disp: 30 g, Rfl: 3   fexofenadine (ALLEGRA) 60 MG tablet, Take 60 mg by mouth daily., Disp: , Rfl:    fluticasone  (FLONASE ) 50 MCG/ACT nasal spray,  Place 2 sprays into both nostrils daily., Disp: 16 g, Rfl: 6   levothyroxine  (SYNTHROID ) 50 MCG tablet, Take 1 tablet by mouth once daily, Disp: 90 tablet, Rfl: 0   lisinopril  (ZESTRIL ) 20 MG tablet, TAKE 1 TABLET BY MOUTH AT BEDTIME, Disp: 90 tablet, Rfl: 0   Menthol , Topical Analgesic, (BIOFREEZE EX), Apply topically., Disp: , Rfl:    Multiple Vitamins-Minerals (MULTIVITAMIN WITH MINERALS) tablet, Take 1 tablet by mouth daily., Disp: , Rfl:    nystatin  (MYCOSTATIN /NYSTOP ) powder, Apply topically 2 (two) times daily as needed. Beneath both breasts, Disp: 15 g, Rfl: 0   OXYQUINOLONE SULFATE VAGINAL (TRIMO-SAN) 0.025 % GEL, Use one applicator full vaginally 2 x a week at hs, Disp: 113.4 g, Rfl: 1   triamcinolone  ointment (KENALOG ) 0.1 %, Apply once daily to hands as needed for hand eczema only., Disp: 30 g, Rfl: 2   Trospium  Chloride 60 MG CP24, Take 1 capsule (60 mg total) by mouth daily., Disp: 30 capsule, Rfl: 2   hyoscyamine  (LEVSIN  SL) 0.125 MG SL tablet, Place 1 tablet (0.125 mg total) under the tongue 2 (two) times daily as needed (for urgency and loose stools). (Patient not taking: Reported on 01/31/2024), Disp: 60 tablet, Rfl: 6   Iron , Ferrous Sulfate , 325 (65 Fe) MG TABS, Take 325 mg by mouth every other day. (Patient not taking: Reported on 06/26/2024), Disp: 45 tablet, Rfl: 2   omeprazole  (PRILOSEC) 20  MG capsule, Take 1 capsule (20 mg total) by mouth 2 (two) times daily for 14 days. (Patient not taking: Reported on 06/26/2024), Disp: 28 capsule, Rfl: 0   Objective:     BP 136/70 (BP Location: Right Arm, Patient Position: Sitting, Cuff Size: Normal)   Pulse (!) 49   Temp (!) 96.9 F (36.1 C) (Temporal)   Ht 5' 2 (1.575 m)   Wt 175 lb 12.8 oz (79.7 kg)   SpO2 95%   BMI 32.15 kg/m  BP Readings from Last 3 Encounters:  06/26/24 136/70  05/29/24 (!) 164/77  03/01/24 (!) 162/73   Wt Readings from Last 3 Encounters:  06/26/24 175 lb 12.8 oz (79.7 kg)  05/29/24 173 lb 6.4 oz (78.7  kg)  03/01/24 175 lb (79.4 kg)      Physical Exam Constitutional:      General: She is not in acute distress.    Appearance: Normal appearance. She is not ill-appearing, toxic-appearing or diaphoretic.  HENT:     Head: Normocephalic and atraumatic.     Right Ear: External ear normal.     Left Ear: External ear normal.     Mouth/Throat:     Mouth: Mucous membranes are moist.     Pharynx: Oropharynx is clear. No oropharyngeal exudate or posterior oropharyngeal erythema.  Eyes:     General: No scleral icterus.       Right eye: No discharge.        Left eye: No discharge.     Extraocular Movements: Extraocular movements intact.     Conjunctiva/sclera: Conjunctivae normal.     Pupils: Pupils are equal, round, and reactive to light.  Neck:     Thyroid : No thyroid  mass, thyromegaly or thyroid  tenderness.     Trachea: Trachea normal.  Cardiovascular:     Rate and Rhythm: Normal rate and regular rhythm.  Pulmonary:     Effort: Pulmonary effort is normal. No respiratory distress.     Breath sounds: Normal breath sounds. No wheezing or rales.  Abdominal:     General: Bowel sounds are normal.  Musculoskeletal:     Cervical back: No rigidity or tenderness.  Lymphadenopathy:     Cervical: No cervical adenopathy.  Skin:    General: Skin is warm and dry.  Neurological:     Mental Status: She is alert and oriented to person, place, and time.  Psychiatric:        Mood and Affect: Mood normal.        Behavior: Behavior normal.      No results found for any visits on 06/26/24.    The 10-year ASCVD risk score (Arnett DK, et al., 2019) is: 36.9%    Assessment & Plan:   Iron  deficiency -     CBC -     Iron , TIBC and Ferritin Panel  Hypothyroidism, unspecified type -     TSH  Prediabetes -     Basic metabolic panel with GFR -     Hemoglobin A1c  Essential hypertension -     Basic metabolic panel with GFR  Immunization due -     Flu vaccine HIGH DOSE PF(Fluzone  Trivalent)  Vitamin D  deficiency -     VITAMIN D  25 Hydroxy (Vit-D Deficiency, Fractures)  B12 deficiency -     Vitamin B12    Return in about 6 months (around 12/24/2024), or if symptoms worsen or fail to improve.    Elsie Sim Lent, MD

## 2024-06-27 LAB — IRON,TIBC AND FERRITIN PANEL
%SAT: 21 % (ref 16–45)
Ferritin: 31 ng/mL (ref 16–288)
Iron: 67 ug/dL (ref 45–160)
TIBC: 320 ug/dL (ref 250–450)

## 2024-06-28 ENCOUNTER — Ambulatory Visit: Payer: Self-pay | Admitting: Family Medicine

## 2024-07-12 ENCOUNTER — Encounter: Payer: Self-pay | Admitting: Dermatology

## 2024-07-12 ENCOUNTER — Ambulatory Visit: Payer: Medicare HMO | Admitting: Dermatology

## 2024-07-12 VITALS — BP 155/81 | HR 54

## 2024-07-12 DIAGNOSIS — L578 Other skin changes due to chronic exposure to nonionizing radiation: Secondary | ICD-10-CM

## 2024-07-12 DIAGNOSIS — D1801 Hemangioma of skin and subcutaneous tissue: Secondary | ICD-10-CM

## 2024-07-12 DIAGNOSIS — L570 Actinic keratosis: Secondary | ICD-10-CM

## 2024-07-12 DIAGNOSIS — L821 Other seborrheic keratosis: Secondary | ICD-10-CM

## 2024-07-12 DIAGNOSIS — Z1283 Encounter for screening for malignant neoplasm of skin: Secondary | ICD-10-CM | POA: Diagnosis not present

## 2024-07-12 DIAGNOSIS — L814 Other melanin hyperpigmentation: Secondary | ICD-10-CM | POA: Diagnosis not present

## 2024-07-12 DIAGNOSIS — W908XXA Exposure to other nonionizing radiation, initial encounter: Secondary | ICD-10-CM | POA: Diagnosis not present

## 2024-07-12 DIAGNOSIS — D229 Melanocytic nevi, unspecified: Secondary | ICD-10-CM

## 2024-07-12 NOTE — Progress Notes (Signed)
 Total Body Skin Exam (TBSE) Visit   Subjective  [name] is a 78 y.o. female who presents for the following: Skin Cancer Screening and Full Body Skin Exam  Patient presents today for follow up visit for TBSE. Patient was last evaluated on 07/14/23 . Patient denies medication changes. Patient reports she does have spots, moles and lesions of concern to be evaluated. Patient reports throughout her lifetime she has had moderate sun exposure. Currently, patient reports if she has excessive sun exposure, she does apply sunscreen and/or wears protective coverings. Patient reports she does not have hx of bx. Patient admits to  family history of skin cancers. The patient has spots, moles and lesions to be evaluated, some may be new or changing and the patient has concerns that these could be cancer.  The following portions of the chart were reviewed this encounter and updated as appropriate: medications, allergies, medical history  Review of Systems:  No other skin or systemic complaints except as noted in HPI or Assessment and Plan.  Objective  Well appearing patient in no apparent distress; mood and affect are within normal limits.  A full examination was performed including scalp, head, eyes, ears, nose, lips, neck, chest, axillae, abdomen, back, buttocks, bilateral upper extremities, bilateral lower extremities, hands, feet, fingers, toes, fingernails, and toenails. All findings within normal limits unless otherwise noted below.   Relevant physical exam findings are noted in the Assessment and Plan.    Assessment & Plan   LENTIGINES, SEBORRHEIC KERATOSES, HEMANGIOMAS - Benign normal skin lesions - Benign-appearing - Call for any changes  MELANOCYTIC NEVI - Tan-brown and/or pink-flesh-colored symmetric macules and papules - Benign appearing on exam today - Observation - Call clinic for new or changing moles - Recommend daily use of broad spectrum spf 30+ sunscreen to sun-exposed  areas.   ACTINIC DAMAGE - Chronic condition, secondary to cumulative UV/sun exposure - diffuse scaly erythematous macules with underlying dyspigmentation - Recommend daily broad spectrum sunscreen SPF 30+ to sun-exposed areas, reapply every 2 hours as needed.  - Staying in the shade or wearing long sleeves, sun glasses (UVA+UVB protection) and wide brim hats (4-inch brim around the entire circumference of the hat) are also recommended for sun protection.  - Call for new or changing lesions.  ACTINIC KERATOSIS Exam: Erythematous thin papules/macules with gritty scale  Actinic keratoses are precancerous spots that appear secondary to cumulative UV radiation exposure/sun exposure over time. They are chronic with expected duration over 1 year. A portion of actinic keratoses will progress to squamous cell carcinoma of the skin. It is not possible to reliably predict which spots will progress to skin cancer and so treatment is recommended to prevent development of skin cancer.  Recommend daily broad spectrum sunscreen SPF 30+ to sun-exposed areas, reapply every 2 hours as needed.  Recommend staying in the shade or wearing long sleeves, sun glasses (UVA+UVB protection) and wide brim hats (4-inch brim around the entire circumference of the hat). Call for new or changing lesions.  Treatment Plan:  Prior to procedure, discussed risks of blister formation, small wound, skin dyspigmentation, or rare scar following cryotherapy. Recommend Vaseline ointment to treated areas while healing.  Destruction Procedure Note Destruction method: cryotherapy   Informed consent: discussed and consent obtained   Lesion destroyed using liquid nitrogen: Yes   Outcome: patient tolerated procedure well with no complications   Post-procedure details: wound care instructions given   Locations: left and right cheek # of Lesions Treated: 2  SKIN CANCER SCREENING PERFORMED TODAY.     No follow-ups on file.  IBerwyn Lesches, Surg Tech III, am acting as scribe for [employer], DO.   Documentation: I have reviewed the above documentation for accuracy and completeness, and I agree with the above.  Delon Lenis, DO

## 2024-07-12 NOTE — Patient Instructions (Addendum)
 For areas treated with Liquid Nitrogen:  Keep clean with soap and water.  Apply Vaseline or Aquaphor twice daily.  Skin Education :   I counseled the patient regarding the following: Sun screen (SPF 30 or greater) should be applied during peak UV exposure (between 10am and 2pm) and reapplied after exercise or swimming.  The ABCDEs of melanoma were reviewed with the patient, and the importance of monthly self-examination of moles was emphasized. Should any moles change in shape or color, or itch, bleed or burn, pt will contact our office for evaluation sooner then their interval appointment.  Plan: Sunscreen Recommendations I recommended a broad spectrum sunscreen with a SPF of 30 or higher. I explained that SPF 30 sunscreens block approximately 97 percent of the sun's harmful rays. Sunscreens should be applied at least 15 minutes prior to expected sun exposure and then every 2 hours after that as long as sun exposure continues. If swimming or exercising sunscreen should be reapplied every 45 minutes to an hour after getting wet or sweating. One ounce, or the equivalent of a shot glass full of sunscreen, is adequate to protect the skin not covered by a bathing suit. I also recommended a lip balm with a sunscreen as well. Sun protective clothing can be used in lieu of sunscreen but must be worn the entire time you are exposed to the sun's rays.  Important Information  Due to recent changes in healthcare laws, you may see results of your pathology and/or laboratory studies on MyChart before the doctors have had a chance to review them. We understand that in some cases there may be results that are confusing or concerning to you. Please understand that not all results are received at the same time and often the doctors may need to interpret multiple results in order to provide you with the best plan of care or course of treatment. Therefore, we ask that you please give us  2 business days to thoroughly  review all your results before contacting the office for clarification. Should we see a critical lab result, you will be contacted sooner.   If You Need Anything After Your Visit  If you have any questions or concerns for your doctor, please call our main line at [phone] If no one answers, please leave a voicemail as directed and we will return your call as soon as possible. Messages left after 4 pm will be answered the following business day.   You may also send us  a message via MyChart. We typically respond to MyChart messages within 1-2 business days.  For prescription refills, please ask your pharmacy to contact our office. Our fax number is [phone].  If you have an urgent issue when the clinic is closed that cannot wait until the next business day, you can page your doctor at the number below.    Please note that while we do our best to be available for urgent issues outside of office hours, we are not available 24/7.   If you have an urgent issue and are unable to reach us , you may choose to seek medical care at your doctor's office, retail clinic, urgent care center, or emergency room.  If you have a medical emergency, please immediately call 911 or go to the emergency department. In the event of inclement weather, please call our main line at [phone] for an update on the status of any delays or closures.  Dermatology Medication Tips: Please keep the boxes that topical medications come in in  order to help keep track of the instructions about where and how to use these. Pharmacies typically print the medication instructions only on the boxes and not directly on the medication tubes.   If your medication is too expensive, please contact our office at [phone] or send us  a message through MyChart.   We are unable to tell what your co-pay for medications will be in advance as this is different depending on your insurance coverage. However, we may be able to find a  substitute medication at lower cost or fill out paperwork to get insurance to cover a needed medication.   If a prior authorization is required to get your medication covered by your insurance company, please allow us  1-2 business days to complete this process.  Drug prices often vary depending on where the prescription is filled and some pharmacies may offer cheaper prices.  The website www.goodrx.com contains coupons for medications through different pharmacies. The prices here do not account for what the cost may be with help from insurance (it may be cheaper with your insurance), but the website can give you the price if you did not use any insurance.  - You can print the associated coupon and take it with your prescription to the pharmacy.  - You may also stop by our office during regular business hours and pick up a GoodRx coupon card.  - If you need your prescription sent electronically to a different pharmacy, notify our office through [facility] or by phone at [phone]

## 2024-07-17 ENCOUNTER — Ambulatory Visit: Admitting: Gastroenterology

## 2024-07-17 ENCOUNTER — Encounter: Payer: Self-pay | Admitting: Gastroenterology

## 2024-07-17 VITALS — BP 134/84 | HR 57 | Ht 62.0 in | Wt 179.6 lb

## 2024-07-17 DIAGNOSIS — K5900 Constipation, unspecified: Secondary | ICD-10-CM | POA: Diagnosis not present

## 2024-07-17 DIAGNOSIS — K648 Other hemorrhoids: Secondary | ICD-10-CM | POA: Diagnosis not present

## 2024-07-17 NOTE — Patient Instructions (Signed)
 _______________________________________________________  If your blood pressure at your visit was 140/90 or greater, please contact your primary care physician to follow up on this.  _______________________________________________________  If you are age 79 or older, your body mass index should be between 23-30. Your Body mass index is 32.85 kg/m. If this is out of the aforementioned range listed, please consider follow up with your Primary Care Provider.  If you are age 80 or younger, your body mass index should be between 19-25. Your Body mass index is 32.85 kg/m. If this is out of the aformentioned range listed, please consider follow up with your Primary Care Provider.   ________________________________________________________  The [facility] providers would like to encourage you to use MYCHART to communicate with providers for non-urgent requests or questions.  Due to long hold times on the telephone, sending your provider a message by [facility] may be a faster and more efficient way to get a response.  Please allow 48 business hours for a response.  Please remember that this is for non-urgent requests.  _______________________________________________________  [facility] is using a team-based approach to care.  Your team is made up of your doctor and two to three APPS. Our APPS (Nurse Practitioners and Physician Assistants) work with your physician to ensure care continuity for you. They are fully qualified to address your health concerns and develop a treatment plan. They communicate directly with your gastroenterologist to care for you. Seeing the Advanced Practice Practitioners on your physician's team can help you by facilitating care more promptly, often allowing for earlier appointments, access to diagnostic testing, procedures, and other specialty referrals.    Thank you for trusting me with your gastrointestinal care!    Dr. Victory Legrand DOUGLAS [facility]

## 2024-07-17 NOTE — Progress Notes (Signed)
 [facility] Progress Note  Chief Complaint: Hemorrhoidal bleeding  Summary of GI history:  EGD and colonoscopy for normocytic anemia in May 2025. Internal hemorrhoids incidentally noted, rectal tubular adenoma-no recall recommended due to age and guidelines Duodenal biopsy negative for sprue        gastritis , H. pylori positive-treated and eradication confirmed on follow-up UBT  Subjective  HPI:  [name] wanted to see me today to discuss hemorrhoidal treatment  Her bowel habits are regular for the most part, but she will have an occasional firm stool.  And on a few occasions in the last 6 months that has led to some hemorrhoidal bleeding.  On 2 occasions it was spots or streaks of blood on the paper, and then most recently about a week and a half ago there was a greater amount of bleeding.  Denies abdominal pain, appetite good, weight stable.   (Nearly 10 minutes late for today's visit) ROS: Cardiovascular:  no chest pain Respiratory: no dyspnea  The patient's Past Medical, Family and Social History were reviewed and are on file in the EMR. Past Medical History:  Diagnosis Date   Allergy    Arthritis    Asthma    mild   Blood transfusion without reported diagnosis    H/O bronchitis    Hemorrhoid    Hx of blood clots 1967   from birth control in LUE   Hypertension    Hypoglycemia    Hypothyroidism    Migraine    from food   Osteopenia 07/04/2019   Pneumonia    hx of   Seasonal allergies    Sleep apnea    CPAP   UTI (urinary tract infection)     Past Surgical History:  Procedure Laterality Date   CATARACT EXTRACTION Bilateral    rt eye   CATARACT EXTRACTION Left 2025   Dr. 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   Procedure: RIGHT TOTAL HIP ARTHROPLASTY;  Surgeon: Tanda DELENA Heading, MD;  Location: [facility];  Service:  Orthopedics;  Laterality: Right;    Objective:  Med list reviewed  Current Outpatient Medications:    acetaminophen  (TYLENOL ) 500 MG tablet, Take 500-1,000 mg by mouth as needed for mild pain or moderate pain., Disp: , Rfl:    albuterol  (PROAIR  HFA) 108 (90 Base) MCG/ACT inhaler, Inhale 2 puffs into the lungs every 6 (six) hours as needed for wheezing or shortness of breath., Disp: 1 each, Rfl: 0   Cholecalciferol (VITAMIN D ) 2000 UNITS CAPS, Take 2,000 Units by mouth daily., Disp: , Rfl:    estradiol  (ESTRACE ) 0.1 MG/GM vaginal cream, Place 0.5 g vaginally 2 (two) times a week. Place 0.5g nightly for two weeks then twice a week after, Disp: 30 g, Rfl: 3   fexofenadine (ALLEGRA) 60 MG tablet, Take 60 mg by mouth daily., Disp: , Rfl:    fluticasone  (FLONASE ) 50 MCG/ACT nasal spray, Place 2 sprays into both nostrils daily., Disp: 16 g, Rfl: 6   hyoscyamine  (LEVSIN  SL) 0.125 MG SL tablet, Place 1 tablet (0.125 mg total) under the tongue 2 (two) times daily as needed (for urgency and loose stools)., Disp: 60 tablet, Rfl: 6   levothyroxine  (SYNTHROID ) 50 MCG tablet, Take 1 tablet by mouth once daily, Disp: 90 tablet, Rfl: 0   lisinopril  (ZESTRIL ) 20  MG tablet, TAKE 1 TABLET BY MOUTH AT BEDTIME, Disp: 90 tablet, Rfl: 0   Menthol , Topical Analgesic, (BIOFREEZE EX), Apply topically., Disp: , Rfl:    Multiple Vitamins-Minerals (MULTIVITAMIN WITH MINERALS) tablet, Take 1 tablet by mouth daily., Disp: , Rfl:    nystatin  (MYCOSTATIN /NYSTOP ) powder, Apply topically 2 (two) times daily as needed. Beneath both breasts, Disp: 15 g, Rfl: 0   triamcinolone  ointment (KENALOG ) 0.1 %, Apply once daily to hands as needed for hand eczema only., Disp: 30 g, Rfl: 2   Trospium  Chloride 60 MG CP24, Take 1 capsule (60 mg total) by mouth daily., Disp: 30 capsule, Rfl: 2   Vital signs in last 24 hrs: Vitals:   07/17/24 1341  BP: 134/84  Pulse: (!) 57   Wt Readings from Last 3 Encounters:  07/17/24 179 lb 9.6 oz  (81.5 kg)  06/26/24 175 lb 12.8 oz (79.7 kg)  05/29/24 173 lb 6.4 oz (78.7 kg)    Physical Exam  Cardiac: Regular without appreciable murmur,  no peripheral edema Pulm: clear to auscultation bilaterally, normal RR and effort noted Abdomen: soft, no tenderness, with active bowel sounds. No guarding or palpable hepatosplenomegaly. She has a difficult time getting in position on the left side, but I was able to perform a rectal exam.  Some redundant perianal skin folds.  No fissure tenderness or palpable internal lesion.  Firm stool rectal vault Anoscopy: RP and RA hemorrhoid columns Labs:   ___________________________________________ Radiologic studies:   ____________________________________________ Other:   _____________________________________________ Assessment & Plan  Assessment: Encounter Diagnoses  Name Primary?   Acute constipation Yes   Bleeding internal hemorrhoids     Episodic constipation that has triggered some hemorrhoidal bleeding, most recently about a week and a half ago.  Even on that occasion, it was still blood on the paper rather than in the bowl. I recommended we focus on avoiding constipation in hopes that may keep bleeding from recurring.  Daily fiber supplement and stool softener will be started.  If bleeding persist despite that, she will contact us  and we can schedule banding.   [name] Brand III

## 2024-07-18 DIAGNOSIS — Z1231 Encounter for screening mammogram for malignant neoplasm of breast: Secondary | ICD-10-CM | POA: Diagnosis not present

## 2024-07-18 LAB — HM MAMMOGRAPHY

## 2024-07-19 ENCOUNTER — Encounter: Payer: Self-pay | Admitting: Family Medicine

## 2024-07-23 ENCOUNTER — Ambulatory Visit: Admitting: Podiatry

## 2024-07-23 ENCOUNTER — Encounter: Payer: Self-pay | Admitting: Podiatry

## 2024-07-23 DIAGNOSIS — B351 Tinea unguium: Secondary | ICD-10-CM | POA: Diagnosis not present

## 2024-07-23 DIAGNOSIS — M79675 Pain in left toe(s): Secondary | ICD-10-CM

## 2024-07-23 DIAGNOSIS — M79674 Pain in right toe(s): Secondary | ICD-10-CM | POA: Diagnosis not present

## 2024-07-23 NOTE — Progress Notes (Signed)
This patient returns to the office for evaluation and treatment of long thick painful nails .  This patient is unable to trim her own nails since the patient cannot reach her feet.  Patient says the nails are painful walking and wearing his shoes.  She returns for preventive foot care services.  General Appearance  Alert, conversant and in no acute stress.  Vascular  Dorsalis pedis and posterior tibial  pulses are palpable  bilaterally.  Capillary return is within normal limits  bilaterally. Temperature is within normal limits  bilaterally.  Neurologic  Senn-Weinstein monofilament wire test within normal limits  bilaterally. Muscle power within normal limits bilaterally.  Nails Thick disfigured discolored nails with subungual debris  from hallux to fifth toes bilaterally. No evidence of bacterial infection or drainage bilaterally.  Orthopedic  No limitations of motion  feet .  No crepitus or effusions noted.  No bony pathology or digital deformities noted.  HAV  B/L.  Skin  normotropic skin with no porokeratosis noted bilaterally.  No signs of infections or ulcers noted.   Asymptomatic callus sub 5th  metabase right foot.  Onychomycosis  Pain in toes right foot  Pain in toes left foot  Debridement  of nails  1-5  B/L with a nail nipper.  Nails were then filed using a dremel tool with no incidents.    RTC 3 months    Renarda Mullinix DPM  

## 2024-07-30 DIAGNOSIS — M169 Osteoarthritis of hip, unspecified: Secondary | ICD-10-CM | POA: Diagnosis not present

## 2024-08-30 ENCOUNTER — Encounter: Payer: Self-pay | Admitting: Obstetrics

## 2024-08-30 ENCOUNTER — Ambulatory Visit: Admitting: Obstetrics

## 2024-08-30 VITALS — BP 115/74 | HR 69

## 2024-08-30 DIAGNOSIS — Z4689 Encounter for fitting and adjustment of other specified devices: Secondary | ICD-10-CM

## 2024-08-30 DIAGNOSIS — N952 Postmenopausal atrophic vaginitis: Secondary | ICD-10-CM

## 2024-08-30 DIAGNOSIS — Z960 Presence of urogenital implants: Secondary | ICD-10-CM | POA: Diagnosis not present

## 2024-08-30 DIAGNOSIS — N814 Uterovaginal prolapse, unspecified: Secondary | ICD-10-CM

## 2024-08-30 DIAGNOSIS — N3946 Mixed incontinence: Secondary | ICD-10-CM | POA: Diagnosis not present

## 2024-08-30 MED ORDER — ESTRADIOL 7.5 MCG/24HR VA RING: 1.0000 | VAGINAL_RING | VAGINAL | 3 refills | Status: AC

## 2024-08-30 NOTE — Patient Instructions (Signed)
 Continue size 4 ring with support pessary use.   For vaginal atrophy (thinning of the vaginal tissue that can cause dryness and burning) and UTI prevention we discussed estrogen replacement in the form of vaginal cream.   Start vaginal estrogen therapy nightly for two weeks then 2 times weekly at night. This can be placed with your finger or an applicator inside the vagina and around the urethra.  Please let us  know if the prescription is too expensive and we can look for alternative options.   Check the cost of Estring  (estrogen ring) at your pharmacy, bring it to your next appointment if it is affordable to replace your need for vaginal estrogen placement  Please call if you experience any change in vaginal discharge or bleeding.   Start fiber supplementation daily to optimize stool consistency.   Avoid fluid intake around 3 hours before bedtime.

## 2024-08-30 NOTE — Assessment & Plan Note (Signed)
-   prior PVR 2mL with pessary in place - We discussed the symptoms of overactive bladder (OAB), which include urinary urgency, urinary frequency, nocturia, with or without urge incontinence.  While we do not know the exact etiology of OAB, several treatment options exist. We discussed management including behavioral therapy (decreasing bladder irritants, urge suppression strategies, timed voids, bladder retraining), physical therapy, medication; for refractory cases posterior tibial nerve stimulation, sacral neuromodulation, and intravesical botulinum toxin injection.  For anticholinergic medications, we discussed the potential side effects of anticholinergics including dry eyes, dry mouth, constipation, cognitive impairment and urinary retention. For Beta-3 agonist medication, we discussed the potential side effect of elevated blood pressure which is more likely to occur in individuals with uncontrolled hypertension. - Gemtesa  not covered by insurance - continue Trospium  due to relief of UUI and SUI - encouraged to continue fluid management and avoid fluid intake 3 hours before bedtime due to leakage with 1st void - continue caffeine reduction - restart low dose vaginal estrogen - For treatment of stress urinary incontinence,  non-surgical options include expectant management, weight loss, physical therapy, as well as a pessary.  Surgical options include a midurethral sling, Burch urethropexy, and transurethral injection of a bulking agent. - optimize stool consistency and encouraged fiber supplementation

## 2024-08-30 NOTE — Assessment & Plan Note (Signed)
-   pessary use since 2022 without self management - discussed risk of change in urinary or bowel symptoms, vaginal ulceration, discharge, bleeding, fistula formation. Explained that pt may require multiple sizes and types for fitting.  - replaced size #4 ring with support pessary and discussed importance of pessary follow-up every 3 months - encouraged to attempt teaching for self management at follow-up

## 2024-08-30 NOTE — Progress Notes (Signed)
 [facility] Return Visit  SUBJECTIVE  History of Present Illness: [name] is a 78 y.o. female seen in follow-up for uterine prolapse, mixed urinary incontinence, constipation, pessary maintenance, abnormal urinalysis, vaginal atrophy, and BMI. Plan at last visit was size 4 ring with support pessary, vaginal estrogen, trial of Gemtesa , titration of fiber supplementation.   Denies vaginal bleeding or discharge with size 4 ring with support pessary.  Last used vaginal estrogen consistently 3-4 weeks ago. Reports improvement of constipation since bowel prep for colonoscopy in 03/01/24 with internal hemorrhoids, polypectomy  Leaks 3-4x/days with urgency or prolonged car rides, now reduced to 1x/day with first void in the morning on Trospium  Reports dry eyes prior to medication, denies change in symptoms since Trospium  at bedtime. Denies dry mouth or constipation. Leaks 1-2x/week with cough/sneeze. Recent exacerbation of coughing after sweeping some dirt 1x/month, improved since vaginal estrogen and Trospium  Drinks: 40oz water per day, 24oz 1/2 caf coffee reduced to 16oz, 8oz decaf unsweeten ice tea, 8oz milk, rarely sprite soda  UA negative 05/29/24  Past Medical History: Patient  has a past medical history of Allergy, Arthritis, Asthma, Blood transfusion without reported diagnosis, H/O bronchitis, Hemorrhoid, blood clots (1967), Hypertension, Hypoglycemia, Hypothyroidism, Migraine, Osteopenia (07/04/2019), Pneumonia, Seasonal allergies, Sleep apnea, and UTI (urinary tract infection).   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   Medications: She has a current medication list which includes the following prescription(s): acetaminophen , albuterol ,  vitamin d , estradiol , estradiol , fexofenadine, fluticasone , hyoscyamine , levothyroxine , lisinopril , menthol  (topical analgesic), multivitamin with minerals, nystatin , triamcinolone  ointment, and trospium  chloride.   Allergies: Patient is allergic to codeine, hydrocodone, porcine (pork) protein-containing drug products, and prednisone .   Social History: Patient  reports that she quit smoking about 49 years ago. Her smoking use included cigarettes. She started smoking about 62 years ago. She has a 39 pack-year smoking history. She has never been exposed to tobacco smoke. She has never used smokeless tobacco. She reports that she does not drink alcohol and does not use drugs.     OBJECTIVE     Physical Exam: Vitals:   08/30/24 1529  BP: 115/74  Pulse: 69   Gen: No apparent distress, A&O x 3.  Pelvic Exam: Normal external female genitalia; Bartholin's and Skene's glands normal in appearance; urethral meatus normal in appearance, no urethral masses or discharge. The pessary was noted to be in place. It was removed and cleaned. Speculum exam revealed no lesions in the vagina. The pessary was replaced. It was comfortable to the patient and fit well.    ASSESSMENT AND PLAN    [name] is a 78 y.o. with:  1. Vaginal atrophy   2. Uterine prolapse   3. Pessary maintenance   4. Mixed stress and urge urinary incontinence     Vaginal atrophy Assessment & Plan: - For symptomatic vaginal atrophy options include lubrication with a water-based lubricant, personal hygiene measures and barrier protection against wetness, and estrogen replacement in the form of vaginal cream, vaginal tablets, or a time-released vaginal ring.   - Encouraged to resume low dose vaginal estrogen to improvement comfort during pessary management, previously avoided due to history of upper extremity blood clot. Change to 0.5g every 2 days to improve compliance since she cannot recall twice a week dosing - Rx estring  to  assess cost and bring to pessary follow-up  if not cost prohibitive  Orders: -     Estradiol ; Place 1 each vaginally every 3 (three) months.  Dispense: 1 each; Refill: 3  Uterine prolapse Assessment & Plan: - self management of size #4 ring with support pessary last seen by Dr. Jertson in 2022 - For treatment of pelvic organ prolapse, we discussed options for management including expectant management, conservative management, and surgical management, such as Kegels, a pessary, pelvic floor physical therapy, and specific surgical procedures. - encouraged to continue pessary use and return to office for pessary maintenance in 3 months to reassess vaginal estrogen use and possible estring  placement - declines refitting for incontinence pessary and self management teaching - pt desires to avoid surgical intervention   Pessary maintenance Assessment & Plan: - pessary use since 2022 without self management - discussed risk of change in urinary or bowel symptoms, vaginal ulceration, discharge, bleeding, fistula formation. Explained that pt may require multiple sizes and types for fitting.  - replaced size #4 ring with support pessary and discussed importance of pessary follow-up every 3 months - encouraged to attempt teaching for self management at follow-up    Mixed stress and urge urinary incontinence Assessment & Plan: - prior PVR 2mL with pessary in place - We discussed the symptoms of overactive bladder (OAB), which include urinary urgency, urinary frequency, nocturia, with or without urge incontinence.  While we do not know the exact etiology of OAB, several treatment options exist. We discussed management including behavioral therapy (decreasing bladder irritants, urge suppression strategies, timed voids, bladder retraining), physical therapy, medication; for refractory cases posterior tibial nerve stimulation, sacral neuromodulation, and intravesical botulinum toxin injection.  For  anticholinergic medications, we discussed the potential side effects of anticholinergics including dry eyes, dry mouth, constipation, cognitive impairment and urinary retention. For Beta-3 agonist medication, we discussed the potential side effect of elevated blood pressure which is more likely to occur in individuals with uncontrolled hypertension. - Gemtesa  not covered by insurance - continue Trospium  due to relief of UUI and SUI - encouraged to continue fluid management and avoid fluid intake 3 hours before bedtime due to leakage with 1st void - continue caffeine reduction - restart low dose vaginal estrogen - For treatment of stress urinary incontinence,  non-surgical options include expectant management, weight loss, physical therapy, as well as a pessary.  Surgical options include a midurethral sling, Burch urethropexy, and transurethral injection of a bulking agent. - optimize stool consistency and encouraged fiber supplementation    Time spent: I spent 24 minutes dedicated to the care of this patient on the date of this encounter to include pre-visit review of records, face-to-face time with the patient discussing pessary maintenance, mixed urinary incontinence, vaginal atrophy, uterine prolapse, and post visit documentation and ordering medication/ testing.    Lianne ONEIDA Gillis, MD

## 2024-08-30 NOTE — Assessment & Plan Note (Signed)
-   For symptomatic vaginal atrophy options include lubrication with a water-based lubricant, personal hygiene measures and barrier protection against wetness, and estrogen replacement in the form of vaginal cream, vaginal tablets, or a time-released vaginal ring.   - Encouraged to resume low dose vaginal estrogen to improvement comfort during pessary management, previously avoided due to history of upper extremity blood clot. Change to 0.5g every 2 days to improve compliance since she cannot recall twice a week dosing - Rx estring  to assess cost and bring to pessary follow-up if not cost prohibitive

## 2024-08-30 NOTE — Assessment & Plan Note (Signed)
-   self management of size #4 ring with support pessary last seen by Dr. Jertson in 2022 - For treatment of pelvic organ prolapse, we discussed options for management including expectant management, conservative management, and surgical management, such as Kegels, a pessary, pelvic floor physical therapy, and specific surgical procedures. - encouraged to continue pessary use and return to office for pessary maintenance in 3 months to reassess vaginal estrogen use and possible estring  placement - declines refitting for incontinence pessary and self management teaching - pt desires to avoid surgical intervention

## 2024-09-05 ENCOUNTER — Telehealth: Payer: Self-pay

## 2024-09-05 NOTE — Telephone Encounter (Signed)
 Patient called stating she spoke with the pharmacy, her Estring  will cost $159.00 and she cannot afford it. She will continuing with the Estradiol  cream.

## 2024-09-15 ENCOUNTER — Other Ambulatory Visit: Payer: Self-pay | Admitting: Family Medicine

## 2024-09-15 DIAGNOSIS — E038 Other specified hypothyroidism: Secondary | ICD-10-CM

## 2024-09-15 DIAGNOSIS — I10 Essential (primary) hypertension: Secondary | ICD-10-CM

## 2024-09-18 NOTE — Telephone Encounter (Signed)
 Refill request received for Levothyroxine  50mcg and Lisinopril  20mg  FOV: 02/04/2025 LOV:06/26/2024 Last refill:06/18/2024 Medication is pending your approval.

## 2024-09-29 ENCOUNTER — Other Ambulatory Visit: Payer: Self-pay | Admitting: Obstetrics

## 2024-09-29 DIAGNOSIS — N3946 Mixed incontinence: Secondary | ICD-10-CM

## 2024-10-01 ENCOUNTER — Telehealth: Payer: Self-pay | Admitting: Pharmacy Technician

## 2024-10-01 ENCOUNTER — Other Ambulatory Visit (HOSPITAL_COMMUNITY): Payer: Self-pay

## 2024-10-01 NOTE — Telephone Encounter (Signed)
 Pharmacy Patient Advocate Encounter   Received notification from RX Request Messages that prior authorization for Trospium  Chloride 60mg  capsules is required/requested.   Insurance verification completed.   The patient is insured through [employer].   Per test claim: The current 30 day co-pay is, $50.00.  No PA needed at this time. This test claim was processed through [facility]- copay amounts may vary at other pharmacies due to pharmacy/plan contracts, or as the patient moves through the different stages of their insurance plan.

## 2024-10-01 NOTE — Telephone Encounter (Signed)
 PA request has been Received. New Encounter has been or will be created for follow up. For additional info see Pharmacy telephone encounter from 10/01/2024.

## 2024-10-10 NOTE — Telephone Encounter (Signed)
 Patient has been notified

## 2024-10-23 ENCOUNTER — Ambulatory Visit: Admitting: Podiatry

## 2024-10-25 ENCOUNTER — Encounter: Payer: Self-pay | Admitting: Podiatry

## 2024-10-25 ENCOUNTER — Ambulatory Visit: Admitting: Podiatry

## 2024-10-25 DIAGNOSIS — B351 Tinea unguium: Secondary | ICD-10-CM | POA: Diagnosis not present

## 2024-10-25 DIAGNOSIS — M79674 Pain in right toe(s): Secondary | ICD-10-CM | POA: Diagnosis not present

## 2024-10-25 DIAGNOSIS — M21611 Bunion of right foot: Secondary | ICD-10-CM

## 2024-10-25 DIAGNOSIS — M79675 Pain in left toe(s): Secondary | ICD-10-CM

## 2024-10-25 DIAGNOSIS — M21612 Bunion of left foot: Secondary | ICD-10-CM

## 2024-10-25 NOTE — Progress Notes (Signed)
 This patient returns to the office for evaluation and treatment of long thick painful nails .  This patient is unable to trim her own nails since the patient cannot reach her feet.  Patient says the nails are painful walking and wearing his shoes.  She returns for preventive foot care services.  General Appearance  Alert, conversant and in no acute stress.  Vascular  Dorsalis pedis and posterior tibial  pulses are palpable  bilaterally.  Capillary return is within normal limits  bilaterally. Temperature is within normal limits  bilaterally.  Neurologic  Senn-Weinstein monofilament wire test within normal limits  bilaterally. Muscle power within normal limits bilaterally.  Nails Thick disfigured discolored nails with subungual debris  from hallux to fifth toes bilaterally. No evidence of bacterial infection or drainage bilaterally.  Orthopedic  No limitations of motion  feet .  No crepitus or effusions noted.  No bony pathology or digital deformities noted.  HAV  B/L.  Skin  normotropic skin with no porokeratosis noted bilaterally.  No signs of infections or ulcers noted.   Asymptomatic callus sub 5th  metabase right foot.  Onychomycosis  Pain in toes right foot  Pain in toes left foot  Debridement  of nails  1-5  B/L with a nail nipper.  Nails were then filed using a dremel tool with no incidents.    RTC 3 months    [facility] weldon

## 2024-11-08 ENCOUNTER — Ambulatory Visit (HOSPITAL_BASED_OUTPATIENT_CLINIC_OR_DEPARTMENT_OTHER): Admitting: Pulmonary Disease

## 2024-12-04 ENCOUNTER — Ambulatory Visit: Admitting: Obstetrics

## 2024-12-27 ENCOUNTER — Ambulatory Visit: Admitting: Obstetrics

## 2025-01-24 ENCOUNTER — Ambulatory Visit: Admitting: Podiatry

## 2025-02-04 ENCOUNTER — Ambulatory Visit

## 2025-07-16 ENCOUNTER — Ambulatory Visit: Admitting: Dermatology
